# Patient Record
Sex: Female | Born: 1950
Health system: Southern US, Community
[De-identification: ages and names within clinical notes are randomized; demographics above are authoritative.]

## PROBLEM LIST (undated history)

## (undated) ENCOUNTER — Inpatient Hospital Stay (AMBULATORY_SURGERY_CENTER): Payer: Medicare Other | Admitting: Podiatry

## (undated) DIAGNOSIS — Z8739 Personal history of other diseases of the musculoskeletal system and connective tissue: Secondary | ICD-10-CM

## (undated) DIAGNOSIS — N946 Dysmenorrhea, unspecified: Secondary | ICD-10-CM

## (undated) DIAGNOSIS — J309 Allergic rhinitis, unspecified: Secondary | ICD-10-CM

## (undated) DIAGNOSIS — K047 Periapical abscess without sinus: Secondary | ICD-10-CM

## (undated) DIAGNOSIS — K589 Irritable bowel syndrome without diarrhea: Secondary | ICD-10-CM

## (undated) DIAGNOSIS — M8580 Other specified disorders of bone density and structure, unspecified site: Secondary | ICD-10-CM

## (undated) DIAGNOSIS — K219 Gastro-esophageal reflux disease without esophagitis: Secondary | ICD-10-CM

## (undated) DIAGNOSIS — I2699 Other pulmonary embolism without acute cor pulmonale: Secondary | ICD-10-CM

## (undated) DIAGNOSIS — K3184 Gastroparesis: Secondary | ICD-10-CM

## (undated) DIAGNOSIS — G43019 Migraine without aura, intractable, without status migrainosus: Principal | ICD-10-CM

## (undated) DIAGNOSIS — F418 Other specified anxiety disorders: Secondary | ICD-10-CM

## (undated) DIAGNOSIS — K900 Celiac disease: Secondary | ICD-10-CM

## (undated) HISTORY — DX: Gastro-esophageal reflux disease without esophagitis: K21.9

## (undated) HISTORY — DX: Migraine without aura, intractable, without status migrainosus: G43.019

## (undated) HISTORY — PX: TONSILLECTOMY: SUR1361

## (undated) HISTORY — DX: Irritable bowel syndrome, unspecified: K58.9

## (undated) HISTORY — DX: Dysmenorrhea, unspecified: N94.6

## (undated) HISTORY — PX: COLONOSCOPY: SHX174

## (undated) HISTORY — PX: FOOT SURGERY: SHX648

## (undated) HISTORY — PX: ABDOMINAL SURGERY: SHX537

## (undated) HISTORY — PX: TOTAL HIP ARTHROPLASTY: SHX124

## (undated) HISTORY — DX: Other specified anxiety disorders: F41.8

## (undated) HISTORY — DX: Allergic rhinitis, unspecified: J30.9

## (undated) HISTORY — PX: NISSEN FUNDOPLICATION: SHX2091

## (undated) HISTORY — DX: Personal history of other diseases of the musculoskeletal system and connective tissue: Z87.39

## (undated) HISTORY — DX: Other specified disorders of bone density and structure, unspecified site: M85.80

## (undated) HISTORY — PX: BREAST BIOPSY: SHX20

---

## 1997-04-10 HISTORY — PX: BREAST CYST ASPIRATION: SHX578

## 1998-03-11 ENCOUNTER — Ambulatory Visit (HOSPITAL_COMMUNITY): Admission: RE | Admit: 1998-03-11 | Discharge: 1998-03-11 | Payer: Self-pay | Admitting: Obstetrics and Gynecology

## 1998-03-11 ENCOUNTER — Encounter: Payer: Self-pay | Admitting: Obstetrics and Gynecology

## 1998-03-16 ENCOUNTER — Ambulatory Visit (HOSPITAL_COMMUNITY): Admission: RE | Admit: 1998-03-16 | Discharge: 1998-03-16 | Payer: Self-pay | Admitting: Obstetrics and Gynecology

## 1998-03-16 ENCOUNTER — Encounter: Payer: Self-pay | Admitting: Obstetrics and Gynecology

## 1998-06-29 ENCOUNTER — Ambulatory Visit (HOSPITAL_COMMUNITY): Admission: RE | Admit: 1998-06-29 | Discharge: 1998-06-29 | Payer: Self-pay | Admitting: *Deleted

## 1998-06-29 ENCOUNTER — Encounter: Payer: Self-pay | Admitting: *Deleted

## 1999-04-05 ENCOUNTER — Other Ambulatory Visit: Admission: RE | Admit: 1999-04-05 | Discharge: 1999-04-05 | Payer: Self-pay | Admitting: *Deleted

## 1999-05-10 ENCOUNTER — Emergency Department (HOSPITAL_COMMUNITY): Admission: EM | Admit: 1999-05-10 | Discharge: 1999-05-10 | Payer: Self-pay | Admitting: Emergency Medicine

## 1999-07-21 ENCOUNTER — Encounter: Payer: Self-pay | Admitting: Gastroenterology

## 1999-07-21 ENCOUNTER — Encounter: Admission: RE | Admit: 1999-07-21 | Discharge: 1999-07-21 | Payer: Self-pay | Admitting: Gastroenterology

## 1999-08-01 ENCOUNTER — Ambulatory Visit (HOSPITAL_COMMUNITY): Admission: RE | Admit: 1999-08-01 | Discharge: 1999-08-01 | Payer: Self-pay | Admitting: Gastroenterology

## 1999-08-01 ENCOUNTER — Encounter (INDEPENDENT_AMBULATORY_CARE_PROVIDER_SITE_OTHER): Payer: Self-pay

## 1999-08-18 ENCOUNTER — Encounter: Payer: Self-pay | Admitting: Gastroenterology

## 1999-08-18 ENCOUNTER — Inpatient Hospital Stay (HOSPITAL_COMMUNITY): Admission: EM | Admit: 1999-08-18 | Discharge: 1999-08-23 | Payer: Self-pay | Admitting: Gastroenterology

## 1999-08-19 ENCOUNTER — Encounter: Payer: Self-pay | Admitting: Gastroenterology

## 1999-08-20 ENCOUNTER — Encounter: Payer: Self-pay | Admitting: Gastroenterology

## 1999-10-10 ENCOUNTER — Encounter: Payer: Self-pay | Admitting: Gastroenterology

## 1999-10-10 ENCOUNTER — Ambulatory Visit (HOSPITAL_COMMUNITY): Admission: RE | Admit: 1999-10-10 | Discharge: 1999-10-10 | Payer: Self-pay | Admitting: Gastroenterology

## 1999-10-31 ENCOUNTER — Encounter: Payer: Self-pay | Admitting: Gastroenterology

## 1999-10-31 ENCOUNTER — Ambulatory Visit (HOSPITAL_COMMUNITY): Admission: RE | Admit: 1999-10-31 | Discharge: 1999-10-31 | Payer: Self-pay | Admitting: Gastroenterology

## 1999-11-28 ENCOUNTER — Encounter: Payer: Self-pay | Admitting: Gastroenterology

## 1999-11-28 ENCOUNTER — Ambulatory Visit (HOSPITAL_COMMUNITY): Admission: RE | Admit: 1999-11-28 | Discharge: 1999-11-28 | Payer: Self-pay | Admitting: Gastroenterology

## 1999-12-19 ENCOUNTER — Other Ambulatory Visit: Admission: RE | Admit: 1999-12-19 | Discharge: 1999-12-19 | Payer: Self-pay | Admitting: Family Medicine

## 2000-02-16 ENCOUNTER — Ambulatory Visit (HOSPITAL_COMMUNITY): Admission: RE | Admit: 2000-02-16 | Discharge: 2000-02-16 | Payer: Self-pay | Admitting: Internal Medicine

## 2000-02-16 ENCOUNTER — Encounter: Payer: Self-pay | Admitting: Internal Medicine

## 2000-04-17 ENCOUNTER — Other Ambulatory Visit: Admission: RE | Admit: 2000-04-17 | Discharge: 2000-04-17 | Payer: Self-pay | Admitting: Obstetrics and Gynecology

## 2000-06-22 ENCOUNTER — Encounter: Payer: Self-pay | Admitting: Internal Medicine

## 2000-06-22 ENCOUNTER — Encounter: Admission: RE | Admit: 2000-06-22 | Discharge: 2000-06-22 | Payer: Self-pay | Admitting: Internal Medicine

## 2000-06-28 ENCOUNTER — Encounter: Payer: Self-pay | Admitting: Internal Medicine

## 2000-06-28 ENCOUNTER — Ambulatory Visit (HOSPITAL_COMMUNITY): Admission: RE | Admit: 2000-06-28 | Discharge: 2000-06-28 | Payer: Self-pay | Admitting: Internal Medicine

## 2001-02-20 ENCOUNTER — Encounter: Payer: Self-pay | Admitting: Internal Medicine

## 2001-02-20 ENCOUNTER — Ambulatory Visit (HOSPITAL_COMMUNITY): Admission: RE | Admit: 2001-02-20 | Discharge: 2001-02-20 | Payer: Self-pay | Admitting: Internal Medicine

## 2001-04-22 ENCOUNTER — Other Ambulatory Visit: Admission: RE | Admit: 2001-04-22 | Discharge: 2001-04-22 | Payer: Self-pay | Admitting: Obstetrics and Gynecology

## 2002-05-06 ENCOUNTER — Other Ambulatory Visit: Admission: RE | Admit: 2002-05-06 | Discharge: 2002-05-06 | Payer: Self-pay | Admitting: Obstetrics and Gynecology

## 2002-10-20 ENCOUNTER — Encounter: Payer: Self-pay | Admitting: Internal Medicine

## 2002-10-20 ENCOUNTER — Encounter: Admission: RE | Admit: 2002-10-20 | Discharge: 2002-10-20 | Payer: Self-pay | Admitting: Internal Medicine

## 2003-05-11 ENCOUNTER — Other Ambulatory Visit: Admission: RE | Admit: 2003-05-11 | Discharge: 2003-05-11 | Payer: Self-pay | Admitting: Obstetrics and Gynecology

## 2004-06-02 ENCOUNTER — Other Ambulatory Visit: Admission: RE | Admit: 2004-06-02 | Discharge: 2004-06-02 | Payer: Self-pay | Admitting: Internal Medicine

## 2004-06-24 ENCOUNTER — Encounter: Admission: RE | Admit: 2004-06-24 | Discharge: 2004-06-24 | Payer: Self-pay | Admitting: Internal Medicine

## 2004-07-28 ENCOUNTER — Encounter (INDEPENDENT_AMBULATORY_CARE_PROVIDER_SITE_OTHER): Payer: Self-pay | Admitting: *Deleted

## 2004-07-28 ENCOUNTER — Ambulatory Visit (HOSPITAL_COMMUNITY): Admission: RE | Admit: 2004-07-28 | Discharge: 2004-07-28 | Payer: Self-pay | Admitting: *Deleted

## 2004-08-13 ENCOUNTER — Emergency Department (HOSPITAL_COMMUNITY): Admission: EM | Admit: 2004-08-13 | Discharge: 2004-08-13 | Payer: Self-pay | Admitting: Emergency Medicine

## 2005-07-04 ENCOUNTER — Other Ambulatory Visit: Admission: RE | Admit: 2005-07-04 | Discharge: 2005-07-04 | Payer: Self-pay | Admitting: Cardiology

## 2005-07-18 ENCOUNTER — Encounter: Admission: RE | Admit: 2005-07-18 | Discharge: 2005-07-18 | Payer: Self-pay | Admitting: Internal Medicine

## 2005-09-06 ENCOUNTER — Encounter: Admission: RE | Admit: 2005-09-06 | Discharge: 2005-09-06 | Payer: Self-pay | Admitting: Internal Medicine

## 2006-03-22 ENCOUNTER — Encounter (INDEPENDENT_AMBULATORY_CARE_PROVIDER_SITE_OTHER): Payer: Self-pay | Admitting: *Deleted

## 2006-03-22 ENCOUNTER — Encounter: Admission: RE | Admit: 2006-03-22 | Discharge: 2006-03-22 | Payer: Self-pay | Admitting: Internal Medicine

## 2006-11-09 ENCOUNTER — Encounter: Admission: RE | Admit: 2006-11-09 | Discharge: 2006-11-09 | Payer: Self-pay | Admitting: Internal Medicine

## 2007-03-04 ENCOUNTER — Encounter: Admission: RE | Admit: 2007-03-04 | Discharge: 2007-03-04 | Payer: Self-pay | Admitting: Internal Medicine

## 2007-07-12 ENCOUNTER — Encounter: Admission: RE | Admit: 2007-07-12 | Discharge: 2007-07-12 | Payer: Self-pay | Admitting: Internal Medicine

## 2007-11-18 ENCOUNTER — Emergency Department (HOSPITAL_COMMUNITY): Admission: EM | Admit: 2007-11-18 | Discharge: 2007-11-18 | Payer: Self-pay | Admitting: Emergency Medicine

## 2008-03-17 ENCOUNTER — Emergency Department (HOSPITAL_COMMUNITY): Admission: EM | Admit: 2008-03-17 | Discharge: 2008-03-17 | Payer: Self-pay | Admitting: Emergency Medicine

## 2008-03-25 ENCOUNTER — Encounter: Admission: RE | Admit: 2008-03-25 | Discharge: 2008-03-25 | Payer: Self-pay | Admitting: Internal Medicine

## 2008-04-06 ENCOUNTER — Encounter: Admission: RE | Admit: 2008-04-06 | Discharge: 2008-04-06 | Payer: Self-pay | Admitting: Internal Medicine

## 2009-02-25 ENCOUNTER — Emergency Department (HOSPITAL_COMMUNITY): Admission: EM | Admit: 2009-02-25 | Discharge: 2009-02-26 | Payer: Self-pay | Admitting: Emergency Medicine

## 2009-03-29 ENCOUNTER — Encounter: Admission: RE | Admit: 2009-03-29 | Discharge: 2009-03-29 | Payer: Self-pay | Admitting: Internal Medicine

## 2009-10-25 ENCOUNTER — Encounter: Admission: RE | Admit: 2009-10-25 | Discharge: 2009-10-25 | Payer: Self-pay | Admitting: Internal Medicine

## 2010-03-30 ENCOUNTER — Encounter
Admission: RE | Admit: 2010-03-30 | Discharge: 2010-03-30 | Payer: Self-pay | Source: Home / Self Care | Attending: Internal Medicine | Admitting: Internal Medicine

## 2010-04-27 ENCOUNTER — Emergency Department (HOSPITAL_COMMUNITY)
Admission: EM | Admit: 2010-04-27 | Discharge: 2010-04-27 | Payer: Self-pay | Source: Home / Self Care | Admitting: Emergency Medicine

## 2010-05-01 ENCOUNTER — Encounter: Payer: Self-pay | Admitting: Internal Medicine

## 2010-05-02 LAB — POCT I-STAT, CHEM 8
Chloride: 108 mEq/L (ref 96–112)
Creatinine, Ser: 1 mg/dL (ref 0.4–1.2)
Glucose, Bld: 106 mg/dL — ABNORMAL HIGH (ref 70–99)
Hemoglobin: 13.6 g/dL (ref 12.0–15.0)
Potassium: 4 mEq/L (ref 3.5–5.1)
Sodium: 141 mEq/L (ref 135–145)

## 2010-05-03 ENCOUNTER — Emergency Department (HOSPITAL_COMMUNITY)
Admission: EM | Admit: 2010-05-03 | Discharge: 2010-05-03 | Payer: Self-pay | Source: Home / Self Care | Admitting: Emergency Medicine

## 2010-08-26 NOTE — Discharge Summary (Signed)
[facility]  Patient:    Katelyn Mcclain, Katelyn Mcclain                     MRN: [id number] Adm. Date:  [id number] Disc. Date: 08/23/99 Attending:  Sherrin Daisy CC:         Theodoro Parma. Francina Ames., M.D.             Magnus Sinning, M.D., Dept. of Surgery, [facility], [place].                           Discharge Summary  REASON FOR ADMISSION:  Nausea, vomiting and dizziness.  FINAL DIAGNOSES: 1. Severe gastroparesis felt to be secondary to vagus nerve injury. 2. Lymphocytic colitis biopsy. 3. Esophageal reflux status post laparoscopic Nissen fundoplication by    Dr. Osborne Oman. 4. History of depression. 5. History of asthma. 6. History of chronic migraines.  PERTINENT HISTORY:  A very nice 60 year old woman who we saw for the first time in early April came in complaining of diarrhea and a 25 to 50 pound weight loss over 6 months. This began follow a laparoscopic Nissen fundoplication by Dr. Osborne Oman at [facility] last summer. She did very well for approximately 2 months and then began to develop nausea, early satiety and vomiting would come and go. In addition to this, she developed diarrhea. These symptoms would improve and then worsen, improve and then worsen and gradually over time become progressively worse. We saw her in the office and at that time her diarrhea was more a symptom and her workup was fairly extensive. This included a small bowel series which was normal, antiendomysial antibody which was negative for celiac disease, a colonoscopy showing chronic colitis that was pathologically felt to be lymphocytic colitis. The terminal ileum was entered and was normal. The patient was started on Asacol but her vomiting has progressed to the point that she was unable to keep down any of her medicines. I saw her in the office on May 10 and at that time she was vomiting, had lost 12 pounds in 1 month. The patient felt weak and dizzy with standing.  PHYSICAL  EXAMINATION:  VITAL SIGNS:  The patient was afebrile, pulse of 72 lying down, blood pressure 120/70 rising to 78, and blood pressure dropping to 110/70 standing.  GENERAL:  The patient felt dizzy. She is a pale, white female in no acute distress.  HEART/LUNG:  Normal.  ABDOMEN:  Soft and nontender.  RECTAL:  Stool is brown and heme negative.  For more details please see admission history and physical.  HOSPITAL COURSE:  The patient was admitted to the medical floor and given IV fluids. We held all of her outpatient medications and started her on clear liquids. Workup was obtained revealing normal TSH, normal free T4, white count normal at 6.0, hemoglobin 13.6, sodium 139, potassium 3.8, chloride 107, CO2 23, BUN 11, creatinine 1.0, calcium 9.2, albumin 4.0. AST, ALT normal. Amylase and lipase normal. Magnesium level normal. Urinalysis showed 11 to 20 white blood cells. The patient had a culture which is still pending and started empirically on Cipro. A&H for serum gastrin level were normal. Stool workup revealed a negative C. diff tox and was negative for white cells, culture still pending on the stool. The patient had a CT scan of the abdomen that showed tablets within the stomach and the cecum otherwise was unremarkable. She had a gastric emptying scan which  showed severe gastroparesis with 100% remaining in the stomach even after 2 hours. The patient was started on a liquid diet and was given IV Reglan and right before discharge erythromycin and Zofran with some improvement. A discussion was undertaken by Dr. Magnus Sinning at [facility] who will see the patient in the office tomorrow afternoon with thoughts toward a possible emptying procedure. This was discussed with the patient and she was eager to proceed with this.  DISPOSITION:  The patient is discharged home. She will hold all of her outpatient medications other than the inhalers which she will use p.r.n. She will take  erythromycin 1 teaspoon 3 times daily before meals, Reglan 20 mg daily 3 times before meals, Zofran 4 mg q 6h and Cipro 500 mg b.i.d. for 5 days. She will hold the Asacol and hold all of her other medicines other than inhalers which she will use on a p.r.n. basis. She is slightly improved and is tolerating small amounts of clear liquids. She will follow-up tomorrow afternoon in the clinic of Dr. Magnus Sinning in [place]. DD:  08/23/99 TD:  08/23/99 Job: [id number]

## 2010-08-26 NOTE — Op Note (Signed)
Katelyn Mcclain, Katelyn Mcclain              ACCOUNT NO.:  [id number]   MEDICAL RECORD NO.:  [id number]          PATIENT TYPE:  AMB   LOCATION:                               FACILITY:  [facility]   PHYSICIAN:  Waverly Ferrari, M.D.    DATE OF BIRTH:  1950-10-30   DATE OF PROCEDURE:  07/28/2004  DATE OF DISCHARGE:                                 OPERATIVE REPORT   PROCEDURES:  Upper endoscopy.   INDICATIONS:  Hemoccult positivity.   ANESTHESIA:  Demerol 100 mg and Versed 10 mg.   DESCRIPTION OF PROCEDURE:  With the patient mildly sedated in the left  lateral decubitus position, the Olympus videoscopic endoscope was inserted  in the mouth and passed under direct vision through the esophagus, which  appeared normal. The distal esophagus was notable for a surgical wrap, such  as a Nissen fundoplication. We entered into the stomach. The fundus, body,  antrum, duodenal bulb and second portion of the duodenum were visualized and  appeared normal. From this point the endoscope was slowly withdrawn, taking  circumferential views of the duodenal mucosa until the endoscope had been  pulled back into the stomach and placed in retroflexion to view the stomach  from below. Once again the surgical wrap could be seen. The endoscope was  then straightened and withdrawn, taking circumferential views of the  remaining gastric and esophageal mucosa. The patient's vital signs and pulse  oximeter remained stable. The patient tolerated procedure well without  apparent complication.   FINDINGS:  Previous surgical fundoplication, otherwise unremarkable  examination. Of note, we did do small bowel biopsies during this procedure.           ______________________________  Waverly Ferrari, M.D.     GMO/MEDQ  D:  09/17/2004  T:  09/17/2004  Job:  [id number]

## 2010-08-26 NOTE — Op Note (Signed)
[name], Katelyn Mcclain              ACCOUNT NO.:  [id number]   MEDICAL RECORD NO.:  [id number]          PATIENT TYPE:  AMB   LOCATION:  ENDO                         FACILITY:  [facility]   PHYSICIAN:  Waverly Ferrari, M.D.    DATE OF BIRTH:  [birth year]   DATE OF PROCEDURE:  07/28/2004  DATE OF DISCHARGE:                                 OPERATIVE REPORT   PROCEDURE:  Upper endoscopy.   INDICATIONS FOR PROCEDURE:  Diarrhea, abdominal pain.   ANESTHESIA:  Demerol 100, Versed 10 mg.   PROCEDURE:  With the patient mildly sedated in the left lateral decubitus  position, the Olympus videoscopic endoscope was inserted in the mouth and  passed under direct vision through the esophagus which appeared normal.  We  entered into the stomach.  The fundus, body, antrum, duodenal bulb, and  second portion of the duodenum were visualized and we went distally as far  as I could advance the scope.  Biopsies were taken and the scope was  withdrawn taking circumferential views of the small bowel and duodenal  mucosa.  The endoscope was then pulled back in the stomach and placed in  retroflexion to view the stomach from below.  The endoscope was then  straightened and withdrawn taking circumferential views of the remaining  gastric and esophageal mucosa.  The patient's vital signs and pulse oximeter  remained stable.  The patient tolerated the procedure well without apparent  complications.   FINDINGS:  Previous fundoplication surgery, otherwise, unremarkable  examination, await biopsy report, the patient will call me for results and  follow up with me as an outpatient.  Proceed with colonoscopy as planned.      GMO/MEDQ  D:  07/28/2004  T:  07/28/2004  Job:  [id number]

## 2010-08-26 NOTE — Procedures (Signed)
[facility]  Patient:    Katelyn Mcclain, Katelyn Mcclain                     MRN: [id number] Proc. Date: 08/22/99 Adm. Date:  [id number] Attending:  Sherrin Daisy CC:         Theodoro Parma. Francina Ames., M.D.                           Procedure Report  PROCEDURE:  Esophagogastroduodenoscopy.  PREMEDICATION:  Hurricane spray, fentanyl 50 mcg, Versed 5 mg IV.  INDICATION:  The patient had a laparoscopic Nissen fundoplication done at [facility] this past summer with continuing progressive problems since then, to the point that she is vomiting nearly everything.  She has ended up losing approximately 50 pounds.  She is admitted to the hospital.  Gastric emptying scan showed no gastric emptying in two hours.  This is done to rule out a mechanical obstruction.  INFORMED CONSENT:  The procedure has been explained to the patient and consent obtained.  DESCRIPTION OF PROCEDURE:  The patient in the left lateral decubitus position, Olympus adult video endoscope inserted blindly in the esophagus and advanced under direct visualization.  The stomach was entered.  There was a large amount of liquid material in the stomach.  The gastric outlet was identified and passed easily way down the second portion of the duodenum.  No evidence of any blockages were seen this far down.  The scope was withdrawn.  The duodenal bulb was normal as was the pyloric channel.  The stomach was carefully examined and again there was a large amount of liquid in stomach but no solid food material.  The wrap was examined in the retroflex view and did appear somewhat distorted. It was questioned if the fundoplication may have become unwrapped.  The GE junction was patent.  There was no gross esophagitis.  The distal and proximal esophagus were seen well and were normal.  The scope was withdrawn.  The patient tolerated the procedure well.  She was maintained on low flow oxygen and pulse  oximeter throughout the procedure with no obvious problems.  ASSESSMENT: 1. Gastroparesis still with a large amount of retention of gastric juice    despite overnight fast. 2. No evidence of outlet obstruction. 3. Question if the fundoplication may have become unwrapped.  PLAN:  We will start her on full liquids, Reglan, erythromycin, and discuss further problems with her. DD:  08/22/99 TD:  08/22/99 Job: [id number]

## 2010-08-26 NOTE — H&P (Signed)
[facility]  Patient:    Katelyn Mcclain, Katelyn Mcclain                     MRN: [id number] Adm. Date:  [id number] Attending:  Sherrin Daisy CC:         Theodoro Parma. Francina Ames., M.D.                         History and Physical  REASON FOR ADMISSION:  Nausea, vomiting, dehydration and weight loss.  HISTORY OF PRESENT ILLNESS:  A nice 60 year old woman I saw for the first time in early April. At that time, she came in with complaints of diarrhea with a 25 pound weight loss over the previous 6 months following a laparoscopic Nissen fundoplication by Dr. Magnus Sinning at [facility]. This was done in January. She said that she did extraordinarily well for 2 months and then she started having diarrhea and vomiting. She has been seen by Dr. Osborne Oman for this several times and had an upper GI and gallbladder ultrasound done last summer as well as an endoscopy and was told that her wrap was okay. Information that we did obtain from [facility] indicates that labs were non-remarkable. She had a normal endoscopy other than what was questioned to be a somewhat loose wrap at the time of endoscopy. In the information that was sent to me, I could not find an ultrasound note although the patient reports that she had this done. We did find a manometry showing a low LES pressure but normal peristalsis that was done prior to her antireflux operation. At the time that I saw her, in addition to nausea and vomiting, she was having severe diarrhea. She had had labs done earlier that were non-remarkable, but we did do a small bowel follow-through with upper GI. The fundus of the stomach showed an unusual appearance for fundoplication with a hiatal hernia repair. There was a question of a partial breakdown. This was confirming somewhat the endoscopy done at [facility]. Small bowel series was normal with no evidence of obstruction. Other tests that had been done had been amylase, lipase,  and liver tests were all normal as well as a normal sed rate and TSH. Anti-endomysial antibody was negative for celiac disease. This subsequently lead to a colonoscopy with biopsies of the colon showing chronic colitis although the mucosa was endoscopically normal. This was felt to be most consistent with lymphocytic colitis. The terminal ileum was entered and was normal. As mentioned, the patient had a previous small bowel series that was normal. She has continued to do very poorly. We started her on Asacol which has helped her diarrhea some, it slowed it down just a bit. Her main complaint continues to be nausea and vomiting.  Since I last saw her exactly one month ago, she has had almost no good meals, continues to throw up nearly everything, continues to suck on ice chips. She has lost a total of 12 pounds in 1 month and her main complaint at this point in time continues to be the nausea and vomiting.  CURRENT MEDICATIONS:  Mircette 28, one daily, Wellbutrin 100 mg one b.i.d., Celexa 40 mg q.d., BuSpar 15/30 q.d., Imitrex p.r.n., Prilosec 20 mg q.d., Lipitor 20 q.d., hydroxyzine p.r.n., Beconase  A.D. q.d., Serevent disc 50 mcg q.d., Vanceril 42 mg q.d. and albuterol 90 mg 2 t.i.d., Asacol 2 tablets t.i.d.  ALLERGIES:  She has no drug  allergies.  PAST MEDICAL HISTORY: 1. Asthma with intermittent bouts of bronchitis and pneumonia. 2. History of depression. 3. History of chronic migraine headaches. 4. Previous surgery include only the Nissen done by Dr. Osborne Oman at [facility]. She has also had surgery on her toe, a tonsillectomy and a previous    D&C.  FAMILY HISTORY:  Her parents are in their 2s and healthy. She has a sibling who died of some sort of septal defect at age 14. No family history of inflammatory bowel disease.  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  REVIEW OF SYSTEMS:  Remarkable for severe nausea and vomiting, inability to swallow or keep foods down and loose stools. She has had problems with previous alternating diarrhea and constipation.  PHYSICAL EXAMINATION:  VITAL SIGNS:  Temperature 99.2, pulse laying 72, blood pressure 120/70 rising to 78, blood pressure 110/70 standing. The patient felt dizzy.  GENERAL:  A pale looking white female. Weight in our office documents a 12 pound weight loss. Eyes, sclerae nonicteric, extraocular movements intact.  NECK:  Supple, no lymphadenopathy.  LUNGS:  Clear.  HEART:  Regular rate and rhythm without murmurs or gallops.  ABDOMEN:  Soft, completely nontender, nondistended. I do not appreciate a succussion splash.  RECTAL:  Stool is brown, heme negative.  ASSESSMENT: 1. Nausea, vomiting, and weight loss. This woman has lost at least 12 pounds    in a month and prior to this has lost an additional 25 pounds for a total    of at least 37 pounds since last summer. The nausea and vomiting appears    her main concern. After a Nissen fundoplication, it should be difficult    for her to vomit. Given the concerns on both the endoscopy and the upper GI    I would wonder if the wrap has come to pieces and may need to be repaired.    I do not think that the lymphocytic colitis is causing problems of nausea    and vomiting although it could be causing some sort of small bowel disease    I suppose. Biliary tract disease would be another possibility. 2. Lymphocytic colitis by colonic biopsy could well be causing her diarrhea.    Seems to have been somewhat improved with Asacol. I do not this again is    causing her nausea and vomiting. 3. Asthma with intermittent bronchitis. 4. History of depression. 5. History of chronic migraines. 6. Esophageal reflux status post antireflux procedure a year ago.  PLAN:  Will admitted to the hospital for IV hydration. Will obtain  routine labs and look for any other obvious abnormalities. Will obtain an ultrasound  of the abdomen and look for possible gallstones and will make further recommendations depending on the results of that. Will probably need to do a CT as well to look to see if the wrap has come down or something. DD:  08/18/99 TD:  08/18/99 Job: [id number]

## 2010-08-26 NOTE — Op Note (Signed)
NAMEDEVANEY, SEGERS              ACCOUNT NO.:  [id number]   MEDICAL RECORD NO.:  [id number]          PATIENT TYPE:  AMB   LOCATION:  ENDO                         FACILITY:  [facility]   PHYSICIAN:  Waverly Ferrari, M.D.    DATE OF BIRTH:  25-Jul-1950   DATE OF PROCEDURE:  DATE OF DISCHARGE:                                 OPERATIVE REPORT   PROCEDURE:  Colonoscopy.   INDICATIONS:  Diarrhea, Hemoccult positivity.   ANESTHESIA:  Demerol 10 mg, Versed 1 mg.   DESCRIPTION OF PROCEDURE:  With the patient mildly sedated in the left  lateral decubitus position, the Olympus videoscopic colonoscope was inserted  into the rectum and passed under direct vision to the cecum identified by  the ileocecal valve and appendiceal orifice, the latter of which was  photographed.  From this point the colonoscope was slowly withdrawn taking  circumferential views of the colonic mucosa, stopping only to take random  biopsies along the way until we reached the rectum which appeared normal on  retroflex view.  The endoscope was straightened and withdrawn.  The  patient's vital signs and pulse oximetry remained stable.  The patient  tolerated the procedure well without apparent complications.   FINDINGS:  Internal hemorrhoids, otherwise unremarkable examination.   Await biopsy report, the patient will call me for results and follow up with  me as an outpatient.      GMO/MEDQ  D:  07/28/2004  T:  07/28/2004  Job:  [id number]

## 2011-01-06 LAB — URINALYSIS, ROUTINE W REFLEX MICROSCOPIC
Hgb urine dipstick: NEGATIVE
Nitrite: NEGATIVE
Specific Gravity, Urine: 1.009
Urobilinogen, UA: 0.2
pH: 6

## 2011-01-06 LAB — POCT I-STAT, CHEM 8
Chloride: 104
HCT: 39
Hemoglobin: 13.3
Potassium: 3.8
Sodium: 140

## 2011-01-12 LAB — COMPREHENSIVE METABOLIC PANEL
ALT: 17 U/L (ref 0–35)
AST: 19 U/L (ref 0–37)
Alkaline Phosphatase: 66 U/L (ref 39–117)
CO2: 26 mEq/L (ref 19–32)
Chloride: 107 mEq/L (ref 96–112)
GFR calc Af Amer: >60 mL/min (ref >60)
GFR calc non Af Amer: 58 mL/min — ABNORMAL LOW (ref >60)
Glucose, Bld: 88 mg/dL (ref 70–99)
Sodium: 141 mEq/L (ref 135–145)
Total Bilirubin: 0.7 mg/dL (ref 0.3–1.2)

## 2011-01-12 LAB — URINALYSIS, ROUTINE W REFLEX MICROSCOPIC
Bilirubin Urine: NEGATIVE
Ketones, ur: 15 mg/dL — AB
Protein, ur: NEGATIVE mg/dL
Urobilinogen, UA: 0.2 mg/dL (ref 0.0–1.0)

## 2011-01-12 LAB — CBC
Hemoglobin: 14.2 g/dL (ref 12.0–15.0)
RBC: 4.58 MIL/uL (ref 3.87–5.11)
WBC: 8.3 10*3/uL (ref 4.0–10.5)

## 2011-01-12 LAB — URINE MICROSCOPIC-ADD ON

## 2011-03-12 ENCOUNTER — Telehealth: Payer: Self-pay | Admitting: Internal Medicine

## 2011-03-12 ENCOUNTER — Encounter: Payer: Self-pay | Admitting: *Deleted

## 2011-03-12 ENCOUNTER — Emergency Department (HOSPITAL_COMMUNITY)
Admission: EM | Admit: 2011-03-12 | Discharge: 2011-03-13 | Disposition: A | Discharge status: Home / Self Care | Payer: BC Managed Care – PPO | Attending: Emergency Medicine | Admitting: Emergency Medicine

## 2011-03-12 DIAGNOSIS — K900 Celiac disease: Secondary | ICD-10-CM | POA: Insufficient documentation

## 2011-03-12 DIAGNOSIS — R109 Unspecified abdominal pain: Secondary | ICD-10-CM | POA: Insufficient documentation

## 2011-03-12 DIAGNOSIS — Z9889 Other specified postprocedural states: Secondary | ICD-10-CM | POA: Insufficient documentation

## 2011-03-12 DIAGNOSIS — R10819 Abdominal tenderness, unspecified site: Secondary | ICD-10-CM | POA: Insufficient documentation

## 2011-03-12 DIAGNOSIS — G8918 Other acute postprocedural pain: Secondary | ICD-10-CM

## 2011-03-12 HISTORY — DX: Gastroparesis: K31.84

## 2011-03-12 HISTORY — DX: Celiac disease: K90.0

## 2011-03-12 LAB — URINALYSIS, ROUTINE W REFLEX MICROSCOPIC
Glucose, UA: NEGATIVE mg/dL
Protein, ur: NEGATIVE mg/dL
Specific Gravity, Urine: 1.017 (ref 1.005–1.030)
pH: 6 (ref 5.0–8.0)

## 2011-03-12 LAB — DIFFERENTIAL
Basophils Absolute: 0 10*3/uL (ref 0.0–0.1)
Basophils Relative: 0 % (ref 0–1)
Eosinophils Relative: 2 % (ref 0–5)
Monocytes Absolute: 0.8 10*3/uL (ref 0.1–1.0)
Neutro Abs: 4.7 10*3/uL (ref 1.7–7.7)

## 2011-03-12 LAB — URINE MICROSCOPIC-ADD ON

## 2011-03-12 LAB — CBC
HCT: 38 % (ref 36.0–46.0)
MCHC: 34.7 g/dL (ref 30.0–36.0)
RDW: 13.1 % (ref 11.5–15.5)

## 2011-03-12 MED ORDER — ONDANSETRON HCL 4 MG/2ML IJ SOLN
4.0000 mg | Freq: Once | INTRAMUSCULAR | Status: AC
  Administered 2011-03-12: 4 mg via INTRAVENOUS
  Filled 2011-03-12: qty 2

## 2011-03-12 MED ORDER — SODIUM CHLORIDE 0.9 % IV BOLUS (SEPSIS)
1000.0000 mL | Freq: Once | INTRAVENOUS | Status: AC
  Administered 2011-03-12: 1000 mL via INTRAVENOUS

## 2011-03-12 MED ORDER — FENTANYL CITRATE 0.05 MG/ML IJ SOLN
50.0000 ug | Freq: Once | INTRAMUSCULAR | Status: AC
  Administered 2011-03-12: 23:00:00 via INTRAVENOUS
  Filled 2011-03-12: qty 2

## 2011-03-12 NOTE — ED Provider Notes (Signed)
History     CSN: [id number] Arrival date & time: 03/12/2011  7:17 PM   First MD Initiated Contact with Patient 03/12/11 2151      Chief Complaint  Patient presents with  . Abdominal Pain    Colonoscopy x 2 days ago, nausea, pt passing flatus, no BM since colonoscopy.    (Consider location/radiation/quality/duration/timing/severity/associated sxs/prior treatment) HPI Pt had a colonoscopy 2 days ago.  She had polyps removed during the procedure.  Since then she has been having pain.  The pain is all over her abdomen. It gets worse when she stands.  No vomiting or diarrhea.  Some nausea.  Passing flatus.  Pt called her GI doctor and was told to come in.  The pain was 9/10 last night.   Past Medical History  Diagnosis Date  . Celiac disease   . Gastroparesis   . Asthma   . Migraine     Past Surgical History  Procedure Date  . Colonoscopy   . Abdominal surgery     History reviewed. No pertinent family history.  History  Substance Use Topics  . Smoking status: Former Research scientist (life sciences)  . Smokeless tobacco: Not on file  . Alcohol Use: No    OB History    Grav Para Term Preterm Abortions TAB SAB Ect Mult Living                  Review of Systems  Gastrointestinal: Positive for abdominal pain. Negative for vomiting, diarrhea and abdominal distention.  Genitourinary: Negative for dysuria.  All other systems reviewed and are negative.    Allergies  Morphine and related  Home Medications   Current Outpatient Rx  Name Route Sig Dispense Refill  . ACETAMINOPHEN 500 MG PO TABS Oral Take 500 mg by mouth every 6 (six) hours as needed. PAIN     . RISAQUAD PO CAPS Oral Take 1 capsule by mouth daily.      . BUPROPION HCL ER (XL) 150 MG PO TB24 Oral Take 150 mg by mouth daily.      Marland Kitchen CALCIUM CARBONATE 600 MG PO TABS Oral Take 600 mg by mouth daily.      . DEXLANSOPRAZOLE 30 MG PO CPDR Oral Take 30 mg by mouth daily.      Marland Kitchen ESCITALOPRAM OXALATE 20 MG PO TABS Oral Take 30 mg by mouth  daily.      . OMEGA-3 FATTY ACIDS 1000 MG PO CAPS Oral Take 2 g by mouth daily.      Marland Kitchen FLUTICASONE-SALMETEROL 100-50 MCG/DOSE IN AEPB Inhalation Inhale 1 puff into the lungs every 12 (twelve) hours.      Marland Kitchen FOLIC ACID 1 MG PO TABS Oral Take 1 mg by mouth daily.      . OSTEO BI-FLEX ADV JOINT SHIELD PO Oral Take 1 tablet by mouth daily.      Marland Kitchen MONTELUKAST SODIUM 10 MG PO TABS Oral Take 10 mg by mouth at bedtime.        BP 124/74  Pulse 86  Temp(Src) 98.4 F (36.9 C) (Oral)  Resp 20  SpO2 99%  Physical Exam  Nursing note and vitals reviewed. Constitutional: She appears well-developed and well-nourished. No distress.  HENT:  Head: Normocephalic and atraumatic.  Right Ear: External ear normal.  Left Ear: External ear normal.  Eyes: Conjunctivae are normal. Right eye exhibits no discharge. Left eye exhibits no discharge. No scleral icterus.  Neck: Neck supple. No tracheal deviation present.  Cardiovascular: Normal rate, regular rhythm and  intact distal pulses.   Pulmonary/Chest: Effort normal and breath sounds normal. No stridor. No respiratory distress. She has no wheezes. She has no rales.  Abdominal: Soft. Bowel sounds are normal. She exhibits no distension. There is tenderness in the suprapubic area. There is no rebound and no guarding.  Musculoskeletal: She exhibits no edema and no tenderness.  Neurological: She is alert. She has normal strength. No sensory deficit. Cranial nerve deficit:  no gross defecits noted. She exhibits normal muscle tone. She displays no seizure activity. Coordination normal.  Skin: Skin is warm and dry. No rash noted.  Psychiatric: She has a normal mood and affect.    ED Course  Procedures (including critical care time)  Medications  Fluticasone-Salmeterol (ADVAIR) 100-50 MCG/DOSE AEPB (not administered)  Dexlansoprazole 30 MG capsule (not administered)  escitalopram (LEXAPRO) 20 MG tablet (not administered)  buPROPion (WELLBUTRIN XL) 150 MG 24 hr  tablet (not administered)  fish oil-omega-3 fatty acids 1000 MG capsule (not administered)  acidophilus (RISAQUAD) CAPS (not administered)  Misc Natural Products (OSTEO BI-FLEX ADV JOINT SHIELD PO) (not administered)  calcium carbonate (OS-CAL) 600 MG TABS (not administered)  folic acid (FOLVITE) 1 MG tablet (not administered)  montelukast (SINGULAIR) 10 MG tablet (not administered)  acetaminophen (TYLENOL) 500 MG tablet (not administered)  ondansetron (ZOFRAN) injection 4 mg (4 mg Intravenous Given 03/12/11 2253)  sodium chloride 0.9 % bolus 1,000 mL (1000 mL Intravenous Given 03/12/11 2300)  fentaNYL (SUBLIMAZE) injection 50 mcg ( mcg Intravenous Given 03/12/11 2255)    Labs Reviewed  URINALYSIS, ROUTINE W REFLEX MICROSCOPIC - Abnormal; Notable for the following:    Hgb urine dipstick MODERATE (*)    Ketones, ur TRACE (*)    Leukocytes, UA TRACE (*)    All other components within normal limits  URINE MICROSCOPIC-ADD ON - Abnormal; Notable for the following:    Bacteria, UA FEW (*)    All other components within normal limits  CBC  DIFFERENTIAL  COMPREHENSIVE METABOLIC PANEL   No results found.    MDM  Patient with recent colonoscopy. She states she's had this procedure in the past and has never had this pain. I discussed the case with Dr. Fuller Plan who was on call for gastroenterology. We will get an abdominal pelvic CT to rule out perforation or other acute complication from the colonoscopy.        Kathalene Frames, MD 03/13/11 [phone]

## 2011-03-12 NOTE — Telephone Encounter (Signed)
PATIENT CALLED A FEW MINUTES AGO REPORTING SIGNIFICANT AND PERSISTENT ABDOMINAL PAIN SINCE HER COLONOSCOPY 2 DAYS AGO. REPORTS POLYPECTOMY. NO N,V, OR BLEEDING. MAYBE LOW GRADE TEMP. INSTRUCTED TO GO TO ER ASAP FOR EVALUATION (R/O PERFORATION OR POST-POLYPECTOMY SYNDROME). SHE AGREES AND WILL GO TO [facility].

## 2011-03-12 NOTE — ED Notes (Signed)
Pt c/o abdominal pain s/p colonoscopy x 2 days ago. Pt denies urinary s/s. Pt denies BM since prior to colonoscopy. Pt c/o nausea, denies vomiting. Pt is passing flatus.

## 2011-03-13 ENCOUNTER — Emergency Department (HOSPITAL_COMMUNITY): Payer: BC Managed Care – PPO

## 2011-03-13 ENCOUNTER — Encounter (HOSPITAL_COMMUNITY): Payer: Self-pay | Admitting: Radiology

## 2011-03-13 LAB — COMPREHENSIVE METABOLIC PANEL
AST: 15 U/L (ref 0–37)
Albumin: 3.3 g/dL — ABNORMAL LOW (ref 3.5–5.2)
Calcium: 9.1 mg/dL (ref 8.4–10.5)
Creatinine, Ser: 0.91 mg/dL (ref 0.50–1.10)
Total Protein: 6.8 g/dL (ref 6.0–8.3)

## 2011-03-13 MED ORDER — OXYCODONE-ACETAMINOPHEN 5-325 MG PO TABS: 2.0000 | ORAL_TABLET | ORAL | Status: AC | PRN

## 2011-03-13 MED ORDER — IOHEXOL 300 MG/ML  SOLN
100.0000 mL | Freq: Once | INTRAMUSCULAR | Status: AC | PRN
  Administered 2011-03-13: 100 mL via INTRAVENOUS

## 2011-03-13 MED ORDER — ONDANSETRON HCL 4 MG/2ML IJ SOLN
4.0000 mg | Freq: Once | INTRAMUSCULAR | Status: AC
  Administered 2011-03-13: 4 mg via INTRAVENOUS
  Filled 2011-03-13: qty 2

## 2011-03-13 NOTE — ED Notes (Signed)
MD at bedside. 

## 2011-03-13 NOTE — ED Notes (Signed)
Patient transported to CT 

## 2011-03-13 NOTE — ED Notes (Signed)
Informed CT that patient was done drinking the contrast media

## 2011-03-13 NOTE — ED Provider Notes (Signed)
  Physical Exam  BP 118/65  Pulse 80  Temp(Src) 98.9 F (37.2 C) (Oral)  Resp 18  SpO2 96%  Physical Exam  ED Course  Procedures  Abdominal pain post colonoscopy. Discussed with GI. CT shows secitis. Discussed with Dr Henrene Pastor. Recommends followup with Dr. Collene Mares.   Jasper Riling. Alvino Chapel, MD 03/13/11 [phone]

## 2011-05-05 ENCOUNTER — Other Ambulatory Visit (HOSPITAL_COMMUNITY): Payer: Self-pay | Admitting: Internal Medicine

## 2011-05-05 DIAGNOSIS — Z1231 Encounter for screening mammogram for malignant neoplasm of breast: Secondary | ICD-10-CM

## 2011-06-02 ENCOUNTER — Ambulatory Visit (HOSPITAL_COMMUNITY)
Admission: RE | Admit: 2011-06-02 | Discharge: 2011-06-02 | Disposition: A | Discharge status: Home / Self Care | Payer: BC Managed Care – PPO | Source: Ambulatory Visit | Attending: Internal Medicine | Admitting: Internal Medicine

## 2011-06-02 DIAGNOSIS — Z1231 Encounter for screening mammogram for malignant neoplasm of breast: Secondary | ICD-10-CM

## 2011-08-03 ENCOUNTER — Other Ambulatory Visit: Payer: Self-pay | Admitting: Orthopedic Surgery

## 2011-08-07 NOTE — H&P (Signed)
  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  She presents at the request of Dr. Jani Mcclain for evaluation and management of a mass on the volar aspect of her left ring finger PIP flexion crease.  This is consistent with a probable myxoid cyst.   She has no antecedent history of injury.   Her past history is reviewed in detail. She reports that she is 5'10" tall and weighs 175 pounds.  Her pain is intermittent. She does not use prescription medication for her discomfort. She notes that she has migraine headaches when exposed to morphine.    Current medications include Dexilant 60 mg. daily, Lexapro 30 mg. daily, Singulair 10 mg. daily, Advair inhalation b.i.d., albuterol PRN and Maxalt PRN migraine headache symptoms.    She lists no prior surgery.    Social history reveals that she is married, she is a nonsmoker, she enjoys a rare alcoholic beverage.  Family history detailed and positive for hypertension and arthritis affecting primary relatives.   14-point review of systems reveals corrective lenses, history of asthma, rash, migraine headaches and easy bleeding.   Physical examination reveals a well appearing 61 year-old woman.  Inspection of her hands reveals a purplish mass on the volar aspect of her left ring finger at the PIP flexion crease. This is quite firm and tender. This is consistent with a myxoid cyst of a vein or the flexor mechanism at the A-3 pulley.    She has full range of motion of her finger. This is quite uncomfortable for her when she grasps a firm object.    ASSESSMENT:    Myxoid cyst, flexor retinaculum vs. venous hemangioma.    PLAN:  We will schedule her for excision of the cyst under local anesthesia as a minor operating room patient at the [facility].  The surgery and aftercare were described in detail. We will schedule this at a mutually convenient time in the near future.   H&P documentation:  08/08/2011  -History and Physical Reviewed  -Patient has been re-examined  -No change in the plan of care  Katelyn Sickle, MD

## 2011-08-07 NOTE — Discharge Instructions (Signed)

## 2011-08-08 ENCOUNTER — Ambulatory Visit (HOSPITAL_BASED_OUTPATIENT_CLINIC_OR_DEPARTMENT_OTHER)
Admission: RE | Admit: 2011-08-08 | Discharge: 2011-08-08 | Disposition: A | Discharge status: Home / Self Care | Payer: BC Managed Care – PPO | Source: Ambulatory Visit | Attending: Orthopedic Surgery | Admitting: Orthopedic Surgery

## 2011-08-08 ENCOUNTER — Encounter (HOSPITAL_BASED_OUTPATIENT_CLINIC_OR_DEPARTMENT_OTHER)
Admission: RE | Disposition: A | Discharge status: Home / Self Care | Payer: Self-pay | Source: Ambulatory Visit | Attending: Orthopedic Surgery

## 2011-08-08 DIAGNOSIS — M799 Soft tissue disorder, unspecified: Secondary | ICD-10-CM | POA: Insufficient documentation

## 2011-08-08 HISTORY — PX: MASS EXCISION: SHX2000

## 2011-08-08 SURGERY — MINOR EXCISION OF MASS
Anesthesia: LOCAL | Site: Finger | Laterality: Left

## 2011-08-08 MED ORDER — LIDOCAINE HCL 2 % IJ SOLN
INTRAMUSCULAR | Status: DC | PRN
  Administered 2011-08-08: 3 mL

## 2011-08-08 MED ORDER — HYDROCODONE-ACETAMINOPHEN 5-325 MG PO TABS: ORAL_TABLET | ORAL | Status: AC

## 2011-08-08 MED ORDER — CHLORHEXIDINE GLUCONATE 4 % EX LIQD: 60.0000 mL | Freq: Once | CUTANEOUS | Status: DC

## 2011-08-08 SURGICAL SUPPLY — 38 items
BANDAGE ADHESIVE 1X3 (GAUZE/BANDAGES/DRESSINGS) IMPLANT
BLADE SURG 15 STRL LF DISP TIS (BLADE) ×1 IMPLANT
BLADE SURG 15 STRL SS (BLADE) ×2
BNDG CMPR 9X4 STRL LF SNTH (GAUZE/BANDAGES/DRESSINGS)
BNDG CMPR MD 5X2 ELC HKLP STRL (GAUZE/BANDAGES/DRESSINGS)
BNDG COHESIVE 1X5 TAN STRL LF (GAUZE/BANDAGES/DRESSINGS) ×1 IMPLANT
BNDG ELASTIC 2 VLCR STRL LF (GAUZE/BANDAGES/DRESSINGS) IMPLANT
BNDG ESMARK 4X9 LF (GAUZE/BANDAGES/DRESSINGS) IMPLANT
BRUSH SCRUB EZ PLAIN DRY (MISCELLANEOUS) ×2 IMPLANT
CLOTH BEACON ORANGE TIMEOUT ST (SAFETY) ×1 IMPLANT
CORDS BIPOLAR (ELECTRODE) IMPLANT
COVER MAYO STAND STRL (DRAPES) ×2 IMPLANT
CUFF TOURNIQUET SINGLE 18IN (TOURNIQUET CUFF) ×1 IMPLANT
DECANTER SPIKE VIAL GLASS SM (MISCELLANEOUS) IMPLANT
DRAIN PENROSE 1/2X12 LTX STRL (WOUND CARE) IMPLANT
DRAPE SURG 17X23 STRL (DRAPES) ×2 IMPLANT
DRAPE UTILITY W/TAPE 26X15 (DRAPES) ×1 IMPLANT
GAUZE SPONGE 4X4 12PLY STRL LF (GAUZE/BANDAGES/DRESSINGS) ×2 IMPLANT
GAUZE XEROFORM 1X8 LF (GAUZE/BANDAGES/DRESSINGS) ×1 IMPLANT
GLOVE BIO SURGEON STRL SZ 6.5 (GLOVE) ×1 IMPLANT
GLOVE BIOGEL M STRL SZ7.5 (GLOVE) ×2 IMPLANT
GLOVE EXAM NITRILE PF MED BLUE (GLOVE) ×1 IMPLANT
GLOVE ORTHO TXT STRL SZ7.5 (GLOVE) ×2 IMPLANT
GOWN PREVENTION PLUS XLARGE (GOWN DISPOSABLE) ×1 IMPLANT
NEEDLE 27GAX1X1/2 (NEEDLE) ×1 IMPLANT
PACK BASIN DAY SURGERY FS (CUSTOM PROCEDURE TRAY) ×2 IMPLANT
PADDING CAST ABS 4INX4YD NS (CAST SUPPLIES)
PADDING CAST ABS COTTON 4X4 ST (CAST SUPPLIES) ×1 IMPLANT
SPONGE GAUZE 4X4 12PLY (GAUZE/BANDAGES/DRESSINGS) ×1 IMPLANT
STOCKINETTE 4X48 STRL (DRAPES) ×2 IMPLANT
SUT ETHILON 5 0 P 3 18 (SUTURE) ×1
SUT NYLON ETHILON 5-0 P-3 1X18 (SUTURE) ×1 IMPLANT
SYR 3ML 23GX1 SAFETY (SYRINGE) IMPLANT
SYR CONTROL 10ML LL (SYRINGE) ×1 IMPLANT
TOWEL OR 17X24 6PK STRL BLUE (TOWEL DISPOSABLE) ×3 IMPLANT
TRAY DSU PREP LF (CUSTOM PROCEDURE TRAY) ×2 IMPLANT
UNDERPAD 30X30 INCONTINENT (UNDERPADS AND DIAPERS) ×2 IMPLANT
WATER STERILE IRR 1000ML POUR (IV SOLUTION) ×2 IMPLANT

## 2011-08-08 NOTE — Brief Op Note (Signed)
08/08/2011  11:06 AM  PATIENT:  Katelyn Mcclain  61 y.o. female  PRE-OPERATIVE DIAGNOSIS:  cyst left ring finger  POST-OPERATIVE DIAGNOSIS:  Thrombosed volar vein vs myxoid cyst  PROCEDURE:   MINOR ROOM EXCISION OF MASS VOLAR ASPECT OF LEFT RING FINGER   SURGEON:  Wyn Forster., MD   PHYSICIAN ASSISTANT:   ASSISTANTS: Mallory Shirk.A-C   ANESTHESIA:   local  EBL:     BLOOD ADMINISTERED:none  DRAINS: none   LOCAL MEDICATIONS USED:  XYLOCAINE   SPECIMEN:  No Specimen  DISPOSITION OF SPECIMEN:  N/A  COUNTS:  YES  TOURNIQUET:   Total Tourniquet Time Documented: Forearm (Left) - 4 minutes  DICTATION: .Other Dictation: Dictation Number [id number]  PLAN OF CARE: Discharge to home after PACU  PATIENT DISPOSITION:  PACU - hemodynamically stable.

## 2011-08-09 ENCOUNTER — Encounter (HOSPITAL_BASED_OUTPATIENT_CLINIC_OR_DEPARTMENT_OTHER): Payer: Self-pay | Admitting: Orthopedic Surgery

## 2011-08-10 NOTE — Op Note (Signed)
[name], [name]              ACCOUNT NO.:  [id number]  MEDICAL RECORD NO.:  [id number]  LOCATION:                                 FACILITY:  PHYSICIAN:  [name]. Wakisha Alberts, M.D. DATE OF BIRTH:  03-03-1951  DATE OF PROCEDURE:  08/08/2011 DATE OF DISCHARGE:                              OPERATIVE REPORT   PREOPERATIVE DIAGNOSIS:  Painful violaceous mass, palmar surface of left ring finger proximal interphalangeal flexion crease.  POSTOPERATIVE DIAGNOSIS:  Probable thrombosed volar vein versus myxoid cyst of vein.  OPERATION:  Resection of painful mass volar aspect of left ring finger proximal interphalangeal flexion crease.  OPERATING SURGEON:  [name]. Betsie Peckman, M.D  ASSISTANT:  Julian Reil, P.A.  ANESTHESIA:  2% lidocaine metacarpal head level block, left ring finger. This performed as a minor operating room procedure.  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  She presented for evaluation and management of a painful mass in her left ring finger that was impairing her ability to work and use her hand for pre hands all function.  Inspection of her hand revealed what appeared to be a violaceous mass consistent with a blood stained myxoid cyst on the volar surface of the flexor sheath versus a thrombosed volar vein.  We recommended that she could observe this as long as she wanted to. This is uncomfortable for her and requested that we excise it.  We recommended simple excision under local anesthesia.  PROCEDURE:  [name] was brought to room 2 of the [facility] and placed in supine position up on the operating table.  Following detailed informed consent and alcohol Betadine prep, 2% lidocaine was infiltrated metacarpal head level.  After approximately 5 minutes of excellent anesthesia of the finger was achieved, the hand and arm were prepped with Betadine soap  and solution, sterilely draped.  Following routine surgical time-out, the hand and arm were exsanguinated by direct compression and arterial tourniquet on the mid forearm was inflated 220 mmHg.  Procedure commenced with an oblique incision directly over the mass. Subcu tissues were carefully divided revealing a 3 mm diameter thrombosed vein versus a myxoid cystic vein.  This was full of organized clot.  This did not appear to involve the flexor sheath directly.  This was resected with a small portion of the feeding vessel.  The wound was then repaired with mattress suture of 5-0 nylon.  A simple dressing was applied with Xeroflo sterile gauze and a Coban finger dressing.  For aftercare, she was provided a prescription for Norco 5 mg 1 p.o. q.4- 6 hours p.r.n. pain.  She will likely use Tylenol or Aleve as her primary analgesic.     [name] [name], M.D.     RVS/MEDQ  D:  08/08/2011  T:  08/08/2011  Job:  [id number]  cc:   Arlyss Repress, MD

## 2011-10-03 ENCOUNTER — Ambulatory Visit (INDEPENDENT_AMBULATORY_CARE_PROVIDER_SITE_OTHER): Payer: BC Managed Care – PPO | Admitting: Family Medicine

## 2011-10-03 ENCOUNTER — Encounter: Payer: Self-pay | Admitting: Family Medicine

## 2011-10-03 ENCOUNTER — Ambulatory Visit: Payer: BC Managed Care – PPO

## 2011-10-03 VITALS — BP 96/60 | HR 71 | Temp 98.1°F | Resp 18 | Ht 70.0 in | Wt 169.6 lb

## 2011-10-03 DIAGNOSIS — M25579 Pain in unspecified ankle and joints of unspecified foot: Secondary | ICD-10-CM

## 2011-10-03 DIAGNOSIS — W19XXXA Unspecified fall, initial encounter: Secondary | ICD-10-CM

## 2011-10-03 NOTE — Progress Notes (Signed)
Patient Name: Katelyn Mcclain Date of Birth: 01/30/1951 Medical Record Number: [id number] Gender: female Date of Encounter: 10/03/2011  History of Present Illness:  Katelyn Mcclain is a 61 y.o. very pleasant female patient who presents with the following:  She fell down a few stairs today.  She was getting ready for work early this mornring.  She missed a couple of stairs, slipped/ fell  and inverted her right ankle.  She had immediate ankle pain, and it is now swollen.  She is able to walk, but she has tenderness.  No other injury today.   There is no problem list on file for this patient.  Past Medical History  Diagnosis Date  . Celiac disease   . Gastroparesis   . Asthma   . Migraine   . Asthma     inhaler prn   Past Surgical History  Procedure Date  . Colonoscopy   . Abdominal surgery   . Mass excision 08/08/2011    Procedure: MINOR EXCISION OF MASS;  Surgeon: Cammie Sickle., MD;  Location: [facility];  Service: Orthopedics;  Laterality: Left;  left ring excision cyst proximal phalangeal joint   History  Substance Use Topics  . Smoking status: Former Research scientist (life sciences)  . Smokeless tobacco: Not on file  . Alcohol Use: No   No family history on file. Allergies  Allergen Reactions  . Morphine And Related Other (See Comments)    Sensitive to narcotics, worsening migraines.  . Adhesive (Tape)     Medication list has been reviewed and updated.  Prior to Admission medications   Medication Sig Start Date End Date Taking? Authorizing Provider  acetaminophen (TYLENOL) 500 MG tablet Take 500 mg by mouth every 6 (six) hours as needed. PAIN    Yes Historical Provider, MD  acidophilus (RISAQUAD) CAPS Take 1 capsule by mouth daily.     Yes Historical Provider, MD  buPROPion (WELLBUTRIN XL) 150 MG 24 hr tablet Take 150 mg by mouth daily.     Yes Historical Provider, MD  calcium carbonate (OS-CAL) 600 MG TABS Take 600 mg by mouth daily.     Yes Historical  Provider, MD  Dexlansoprazole 30 MG capsule Take 30 mg by mouth daily.     Yes Historical Provider, MD  escitalopram (LEXAPRO) 20 MG tablet Take 30 mg by mouth daily.     Yes Historical Provider, MD  fish oil-omega-3 fatty acids 1000 MG capsule Take 2 g by mouth daily.     Yes Historical Provider, MD  Fluticasone-Salmeterol (ADVAIR) 100-50 MCG/DOSE AEPB Inhale 1 puff into the lungs every 12 (twelve) hours.     Yes Historical Provider, MD  folic acid (FOLVITE) 1 MG tablet Take 1 mg by mouth daily.     Yes Historical Provider, MD  Misc Natural Products (OSTEO BI-FLEX ADV JOINT SHIELD PO) Take 1 tablet by mouth daily.     Yes Historical Provider, MD  montelukast (SINGULAIR) 10 MG tablet Take 10 mg by mouth at bedtime.     Yes Historical Provider, MD    Review of Systems:  As per HPI- otherwise negative.   Physical Examination: Filed Vitals:   10/03/11 0917  BP: 96/60  Pulse: 71  Temp: 98.1 F (36.7 C)  Resp: 18   Filed Vitals:   10/03/11 0917  Height: 5' 10"  (1.778 m)  Weight: 169 lb 9.6 oz (76.93 kg)   Body mass index is 24.34 kg/(m^2). Ideal Body Weight: Weight in (lb)  to have BMI = 25: 173.9    GEN: WDWN, NAD, Non-toxic, Alert & Oriented x 3 HEENT: Atraumatic, Normocephalic.  Ears and Nose: No external deformity. EXTR: No clubbing/cyanosis/edema NEURO: Normal gait.  PSYCH: Normally interactive. Conversant. Not depressed or anxious appearing.  Calm demeanor.  Right ankle and foot: tender and swollen most over lateral malleolus, but also slightly at medial ankle,  Achilles intact.  Joint intact and located.  Foot without significant tenderness, no swelling or bruising in foot.  [facility] reading (PRIMARY) by  Dr. Lorelei Pont.  Negative right foot and ankle RIGHT ANKLE - COMPLETE 3+ VIEW  Comparison: None.  Findings: No acute bony abnormality. Specifically, no fracture, subluxation, or dislocation. Soft tissues are intact.  IMPRESSION: No acute bony abnormality.  RIGHT ANKLE -  COMPLETE 3+ VIEW  Comparison: None.  Findings: No acute bony abnormality. Specifically, no fracture, subluxation, or dislocation. Soft tissues are intact.  IMPRESSION: No acute bony abnormality.  Assessment and Plan: 1. Fall    2. Pain, ankle  DG Ankle Complete Right, DG Foot Complete Right   Ankle sprain.  Gave aircast and crutches.  Use these PRN for pain.  Elevate and ice.  If not better by the end of the week please let me know- Sooner if worse.     Lamar Blinks, MD

## 2012-02-29 ENCOUNTER — Other Ambulatory Visit (HOSPITAL_COMMUNITY): Payer: Self-pay | Admitting: Cardiovascular Disease

## 2012-02-29 DIAGNOSIS — R079 Chest pain, unspecified: Secondary | ICD-10-CM

## 2012-03-20 ENCOUNTER — Inpatient Hospital Stay (HOSPITAL_COMMUNITY): Admission: RE | Admit: 2012-03-20 | Payer: BC Managed Care – PPO | Source: Ambulatory Visit

## 2012-03-29 ENCOUNTER — Encounter (HOSPITAL_COMMUNITY): Payer: BC Managed Care – PPO

## 2012-04-05 ENCOUNTER — Ambulatory Visit (HOSPITAL_COMMUNITY)
Admission: RE | Admit: 2012-04-05 | Discharge: 2012-04-05 | Disposition: A | Discharge status: Home / Self Care | Payer: BC Managed Care – PPO | Source: Ambulatory Visit | Attending: Cardiovascular Disease | Admitting: Cardiovascular Disease

## 2012-04-05 DIAGNOSIS — R079 Chest pain, unspecified: Secondary | ICD-10-CM

## 2012-04-05 MED ORDER — TECHNETIUM TC 99M SESTAMIBI GENERIC - CARDIOLITE
10.4000 | Freq: Once | INTRAVENOUS | Status: AC | PRN
  Administered 2012-04-05: 10 via INTRAVENOUS

## 2012-04-05 MED ORDER — REGADENOSON 0.4 MG/5ML IV SOLN
0.4000 mg | Freq: Once | INTRAVENOUS | Status: AC
  Administered 2012-04-05: 0.4 mg via INTRAVENOUS

## 2012-04-05 MED ORDER — TECHNETIUM TC 99M SESTAMIBI GENERIC - CARDIOLITE
30.8000 | Freq: Once | INTRAVENOUS | Status: AC | PRN
  Administered 2012-04-05: 30.8 via INTRAVENOUS

## 2012-04-05 NOTE — Procedures (Addendum)
[facility] [address] [place] [address] [phone]  Cardiology Nuclear Med Study  Katelyn Mcclain is a 61 y.o. female     MRN : [id number]     DOB: 12-02-1950  Procedure Date: 04/05/2012  Nuclear Med Background Indication for Stress Test:  Evaluation for Ischemia History:  No prior cardiac history Cardiac Risk Factors: Family History - CAD and Lipids  Symptoms:  Chest Pain, Dizziness and SOB   Nuclear Pre-Procedure Caffeine/Decaff Intake:  7:00pm NPO After: 7:00am   IV Site: R Antecubital  IV 0.9% NS with Angio Cath:  22g  Chest Size (in):  n/a IV Started by: Otho Perl, CNMT  Height: 5' 9"  (1.753 m)  Cup Size: C  BMI:  Body mass index is 25.10 kg/(m^2). Weight:  170 lb (77.111 kg)   Tech Comments:  n/a    Nuclear Med Study 1 or 2 day study: 1 day  Stress Test Type:  [place]  Order Authorizing Provider:  Quay Burow, MD   Resting Radionuclide: Technetium 71mSestamibi  Resting Radionuclide Dose: 10.4 mCi   Stress Radionuclide:  Technetium 948mestamibi  Stress Radionuclide Dose: 30.8 mCi           Stress Protocol Rest HR: 53 Stress HR: 76  Rest BP: 113/77 Stress BP: 132/83  Exercise Time (min): n/a METS: n/a          Dose of Adenosine (mg):  n/a Dose of Lexiscan: 0.4 mg  Dose of Atropine (mg): n/a Dose of Dobutamine: n/a mcg/kg/min (at max HR)  Stress Test Technologist: GwMellody MemosCCT Nuclear Technologist: PaImagene RichesCNMT   Rest Procedure:  Myocardial perfusion imaging was performed at rest 45 minutes following the intravenous administration of Technetium 9920mstamibi. Stress Procedure:  The patient received IV Lexiscan 0.4 mg over 15-seconds.  Technetium 56m34mtamibi injected at 30-seconds.  There were no significant changes with Lexiscan.  Quantitative spect images were obtained after a 45 minute delay.  Transient Ischemic Dilatation (Normal <1.22):  1.08 Lung/Heart Ratio  (Normal <0.45):  0.29 QGS EDV:  53 ml QGS ESV:  13 ml LV Ejection Fraction: 76%  Signed by      Rest ECG: NSR - Normal EKG  Stress ECG: No significant change from baseline ECG  QPS Raw Data Images:  Normal; no motion artifact; normal heart/lung ratio. Stress Images:  Normal homogeneous uptake in all areas of the myocardium. Rest Images:  Normal homogeneous uptake in all areas of the myocardium. Subtraction (SDS):  No evidence of ischemia.  Impression Exercise Capacity:  Lexiscan with no exercise. BP Response:  Normal blood pressure response. Clinical Symptoms:  No significant symptoms noted. ECG Impression:  No significant ST segment change suggestive of ischemia. Comparison with Prior Nuclear Study: No significant change from previous study  Overall Impression:  Normal stress nuclear study.  LV Wall Motion:  NL LV Function; NL Wall Motion   Katelyn Mcclain  04/05/2012 6:05 PM

## 2012-08-05 ENCOUNTER — Encounter: Payer: Self-pay | Admitting: Cardiovascular Disease

## 2013-05-11 ENCOUNTER — Ambulatory Visit (INDEPENDENT_AMBULATORY_CARE_PROVIDER_SITE_OTHER): Payer: BC Managed Care – PPO | Admitting: Physician Assistant

## 2013-05-11 VITALS — BP 122/60 | HR 82 | Temp 97.9°F | Resp 18 | Ht 69.0 in | Wt 170.0 lb

## 2013-05-11 DIAGNOSIS — R05 Cough: Secondary | ICD-10-CM

## 2013-05-11 DIAGNOSIS — R5381 Other malaise: Secondary | ICD-10-CM

## 2013-05-11 DIAGNOSIS — J45901 Unspecified asthma with (acute) exacerbation: Secondary | ICD-10-CM

## 2013-05-11 DIAGNOSIS — D72829 Elevated white blood cell count, unspecified: Secondary | ICD-10-CM

## 2013-05-11 DIAGNOSIS — R5383 Other fatigue: Secondary | ICD-10-CM

## 2013-05-11 DIAGNOSIS — R059 Cough, unspecified: Secondary | ICD-10-CM

## 2013-05-11 LAB — POCT INFLUENZA A/B
INFLUENZA A, POC: NEGATIVE
Influenza B, POC: NEGATIVE

## 2013-05-11 LAB — POCT CBC
GRANULOCYTE PERCENT: 69.2 % (ref 37–80)
HEMATOCRIT: 43.5 % (ref 37.7–47.9)
HEMOGLOBIN: 13.9 g/dL (ref 12.2–16.2)
Lymph, poc: 2.5 (ref 0.6–3.4)
MCH, POC: 31 pg (ref 27–31.2)
MCHC: 32 g/dL (ref 31.8–35.4)
MCV: 97.1 fL — AB (ref 80–97)
MID (cbc): 0.7 (ref 0–0.9)
MPV: 8.9 fL (ref 0–99.8)
POC GRANULOCYTE: 7.1 — AB (ref 2–6.9)
POC LYMPH PERCENT: 24.2 %L (ref 10–50)
POC MID %: 6.6 %M (ref 0–12)
Platelet Count, POC: 292 10*3/uL (ref 142–424)
RBC: 4.48 M/uL (ref 4.04–5.48)
RDW, POC: 13.8 %
WBC: 10.3 10*3/uL — AB (ref 4.6–10.2)

## 2013-05-11 MED ORDER — AZITHROMYCIN 250 MG PO TABS: ORAL_TABLET | ORAL | Status: DC

## 2013-05-11 MED ORDER — HYDROCODONE-HOMATROPINE 5-1.5 MG/5ML PO SYRP: ORAL_SOLUTION | ORAL | Status: DC

## 2013-05-11 MED ORDER — PREDNISONE 20 MG PO TABS: ORAL_TABLET | ORAL | Status: DC

## 2013-05-11 MED ORDER — IPRATROPIUM BROMIDE 0.02 % IN SOLN: 0.5000 mg | Freq: Once | RESPIRATORY_TRACT | Status: AC

## 2013-05-11 MED ORDER — IPRATROPIUM BROMIDE 0.02 % IN SOLN: 0.5000 mg | Freq: Four times a day (QID) | RESPIRATORY_TRACT | Status: DC

## 2013-05-11 MED ORDER — ALBUTEROL SULFATE (2.5 MG/3ML) 0.083% IN NEBU
2.5000 mg | INHALATION_SOLUTION | Freq: Once | RESPIRATORY_TRACT | Status: AC
  Administered 2013-05-11: 2.5 mg via RESPIRATORY_TRACT

## 2013-05-11 MED ORDER — ALBUTEROL SULFATE (2.5 MG/3ML) 0.083% IN NEBU: 2.5000 mg | INHALATION_SOLUTION | Freq: Four times a day (QID) | RESPIRATORY_TRACT | Status: DC | PRN

## 2013-05-11 MED ORDER — BENZONATATE 200 MG PO CAPS: 200.0000 mg | ORAL_CAPSULE | Freq: Two times a day (BID) | ORAL | Status: DC | PRN

## 2013-05-11 MED ORDER — ALBUTEROL SULFATE (2.5 MG/3ML) 0.083% IN NEBU: 2.5000 mg | INHALATION_SOLUTION | Freq: Once | RESPIRATORY_TRACT | Status: DC

## 2013-05-11 NOTE — Progress Notes (Signed)
Subjective:    Patient ID: Katelyn Mcclain, female    DOB: [birth year], 63 y.o.   MRN: [id number]  HPI 63 year old female presents with a 5 day history of nasal congestion, sore throat, cough, rhinorrhea, fever, chills, fatigue, and myalgias. Cough is not productive and not associated with time of day. No SOB or wheezing at this time. No sinus pressure. Subjective fever and chills. Nasal congestion thick and green/yellow. Ears feel full, leading to sensation of muffled hearing. The above has triggered an increase in her migraines. Has tried OTC cold preps without success. She also has a nebulizer at home, but her albuterol nebules are out of date and she has not used them. No GI complaints. Appetite decreased. No recent antibiotics or recent travels. No leg trauma, sedentary periods, h/o cancer, or tobacco use.  Asthma is well controlled at baseline. Typically only with a flare up during this time of year. Currently taking Symbicort 160/4.5, Advair 100/50, and Singulair 10mg  for her breathing.   PMH: Past Medical History  Diagnosis Date  . Celiac disease   . Gastroparesis   . Asthma   . Migraine   . Asthma     inhaler prn     Home Meds: Prior to Admission medications   Medication Sig Start Date End Date Taking? Authorizing Provider  baclofen (LIORESAL) 10 MG tablet Take 10 mg by mouth 3 (three) times daily.   Yes Historical Provider, MD  budesonide-formoterol (SYMBICORT) 160-4.5 MCG/ACT inhaler Inhale 2 puffs into the lungs 2 (two) times daily.   Yes Historical Provider, MD  Dexlansoprazole 30 MG capsule Take 30 mg by mouth daily.     Yes Historical Provider, MD  escitalopram (LEXAPRO) 20 MG tablet Take 30 mg by mouth daily.     Yes Historical Provider, MD  eszopiclone (LUNESTA) 1 MG TABS tablet Take 1 mg by mouth at bedtime as needed for sleep. Take immediately before bedtime   Yes Historical Provider, MD  folic acid (FOLVITE) 1 MG tablet Take 1 mg by mouth daily.     Yes Historical  Provider, MD  LORazepam (ATIVAN) 0.5 MG tablet Take 0.5 mg by mouth every 8 (eight) hours.   Yes Historical Provider, MD  Misc Natural Products (OSTEO BI-FLEX ADV JOINT SHIELD PO) Take 1 tablet by mouth daily.     Yes Historical Provider, MD  montelukast (SINGULAIR) 10 MG tablet Take 10 mg by mouth at bedtime.     Yes Historical Provider, MD  rizatriptan (MAXALT) 10 MG tablet Take 10 mg by mouth as needed for migraine. May repeat in 2 hours if needed   Yes Historical Provider, MD  buPROPion (WELLBUTRIN XL) 150 MG 24 hr tablet Take 150 mg by mouth daily.      Historical Provider, MD  calcium carbonate (OS-CAL) 600 MG TABS Take 600 mg by mouth daily.      Historical Provider, MD  fish oil-omega-3 fatty acids 1000 MG capsule Take 2 g by mouth daily.      Historical Provider, MD  Fluticasone-Salmeterol (ADVAIR) 100-50 MCG/DOSE AEPB Inhale 1 puff into the lungs every 12 (twelve) hours.      Historical Provider, MD    Allergies:  Allergies  Allergen Reactions  . Morphine And Related Other (See Comments)    Sensitive to narcotics, worsening migraines.  . Adhesive [Tape]     History   Social History  . Marital Status: Married    Spouse Name: N/A    Number of Children:  N/A  . Years of Education: N/A   Occupational History  . Not on file.   Social History Main Topics  . Smoking status: Former Research scientist (life sciences)  . Smokeless tobacco: Not on file  . Alcohol Use: No  . Drug Use: No  . Sexual Activity: Yes    Birth Control/ Protection: Post-menopausal   Other Topics Concern  . Not on file   Social History Narrative  . No narrative on file       Review of Systems  Constitutional: Positive for fever, chills, appetite change and fatigue.       Subjective fever.   HENT: Positive for congestion, ear pain, hearing loss, rhinorrhea and sore throat. Negative for postnasal drip, sinus pressure and sneezing.        Nasal congestion.   Respiratory: Positive for cough. Negative for shortness of breath  and wheezing.        Cough is not productive. Cough is not associated with time of day.   Gastrointestinal: Negative for nausea, vomiting and diarrhea.  Musculoskeletal: Positive for myalgias.  Neurological: Positive for headaches.       Objective:   Physical Exam  Physical Exam: Blood pressure 122/60, pulse 82, temperature 97.9 F (36.6 C), temperature source Oral, resp. rate 18, height 5\' 9"  (1.753 m), weight 170 lb (77.111 kg), SpO2 99.00%., Body mass index is 25.09 kg/(m^2). General: Well developed, well nourished, in no acute distress. Head: Normocephalic, atraumatic, eyes without discharge, sclera non-icteric, nares are congested. Bilateral auditory canals clear, TM's are without perforation, pearly grey with reflective cone of light bilaterally. No sinus TTP. Oral cavity moist, dentition normal. Posterior pharynx with post nasal drip and mild erythema. No peritonsillar abscess or tonsillar exudate. Uvula midline.  Neck: Supple. No thyromegaly. Full ROM. No lymphadenopathy. Lungs: Coarse breath sounds bilaterally with mild wheezing bilaterally. No rales or rhonchi. Breathing is unlabored. Status post albuterol and atrovent neb patient feels: much better. Wheezing resolved.  Heart: RRR with S1 S2. No murmurs, rubs, or gallops appreciated. Msk:  Strength and tone normal for age. Extremities: No clubbing or cyanosis. No edema. Neuro: Alert and oriented X 3. Moves all extremities spontaneously. CNII-XII grossly in tact. Psych:  Responds to questions appropriately with a normal affect.   Labs: Results for orders placed in visit on 05/11/13  POCT INFLUENZA A/B      Result Value Range   Influenza A, POC Negative     Influenza B, POC Negative    POCT CBC      Result Value Range   WBC 10.3 (*) 4.6 - 10.2 K/uL   Lymph, poc 2.5  0.6 - 3.4   POC LYMPH PERCENT 24.2  10 - 50 %L   MID (cbc) 0.7  0 - 0.9   POC MID % 6.6  0 - 12 %M   POC Granulocyte 7.1 (*) 2 - 6.9   Granulocyte percent  69.2  37 - 80 %G   RBC 4.48  4.04 - 5.48 M/uL   Hemoglobin 13.9  12.2 - 16.2 g/dL   HCT, POC 43.5  37.7 - 47.9 %   MCV 97.1 (*) 80 - 97 fL   MCH, POC 31.0  27 - 31.2 pg   MCHC 32.0  31.8 - 35.4 g/dL   RDW, POC 13.8     Platelet Count, POC 292  142 - 424 K/uL   MPV 8.9  0 - 99.8 fL        Assessment & Plan:  62 year  old female with acute asthma exacerbation secondary to URI -Albuterol and Atrovent neb in office -Albuterol neb in office -Azithromycin 250 MG #6 2 po first day then 1 po next 4 days no RF -Prednisone 20 mg #18 3x3, 2x3, 1x3 no RF -Albuterol neb 1 neb inhaled q 6 hours #1 RF 12 -Atrovent neb 1 neb inhaled q 6 hours #1 RF 12 -Hycodan #4oz 1 tsp po q 4-6 hours prn cough no RF SED, she does ok with hydrocone cough syrups -Tessalon Perles 200 mg 1 po tid prn cough #60 no RF -Rest -RTC precautions -Follow up with PCP as directed    Christell Faith, MHS, PA-C [facility] [place], [place] [id number] [facility] 05/11/2013 9:30 AM

## 2013-05-16 ENCOUNTER — Encounter: Payer: Self-pay | Admitting: Neurology

## 2013-05-16 ENCOUNTER — Ambulatory Visit: Payer: BC Managed Care – PPO | Admitting: Cardiovascular Disease

## 2013-05-20 ENCOUNTER — Ambulatory Visit (INDEPENDENT_AMBULATORY_CARE_PROVIDER_SITE_OTHER): Payer: BC Managed Care – PPO | Admitting: Neurology

## 2013-05-20 ENCOUNTER — Encounter (INDEPENDENT_AMBULATORY_CARE_PROVIDER_SITE_OTHER): Payer: Self-pay

## 2013-05-20 ENCOUNTER — Encounter: Payer: Self-pay | Admitting: Neurology

## 2013-05-20 VITALS — BP 109/69 | HR 76 | Ht 70.5 in | Wt 176.0 lb

## 2013-05-20 DIAGNOSIS — G43019 Migraine without aura, intractable, without status migrainosus: Secondary | ICD-10-CM

## 2013-05-20 HISTORY — DX: Migraine without aura, intractable, without status migrainosus: G43.019

## 2013-05-20 MED ORDER — ZONISAMIDE 100 MG PO CAPS: 100.0000 mg | ORAL_CAPSULE | Freq: Every day | ORAL | Status: DC

## 2013-05-20 MED ORDER — ZONISAMIDE 25 MG PO CAPS: ORAL_CAPSULE | ORAL | Status: DC

## 2013-05-20 NOTE — Progress Notes (Signed)
Reason for visit: Headache  Katelyn Mcclain is a 63 y.o. female  History of present illness:  Katelyn Mcclain is a 63 year old right-handed white female with a history of migraine headaches. The patient has had migraine off and on since 1989. At times, the headaches have been quite frequent. In November 2014, her parents moved into her home, and her father has a history of dementia. This has put significant stress on her, and the patient began having headaches that have been virtually daily for a month into November 2014. The patient has had fewer headaches more recently, but the patient still has had only 13 headache free days within the last month. The patient was seen through the [facility], and the patient was placed on Zonegran. The patient received trigger point injections, and she was placed on baclofen. The patient has tension in the shoulders, and baclofen has helped this issue. The patient denies any neck stiffness. The headaches are mainly bitemporal in nature associated with visual aura, nausea vomiting, and true vertigo. The patient denies any focal numbness or weakness of the face, arms, or legs. The patient takes some Phenergan for the nausea. The patient has Imitrex nasal spray to take for the headaches. The patient believes that the Zonegran at 100 mg daily has helped some. The patient recently has had a lot of sinus infections, currently on antibiotics. The patient has a history of asthma. The patient is sent to this office for further evaluation. The patient is not sleeping well, and she has gained some benefit with low-dose alprazolam. In the past, the patient has had issues with weight gain on amitriptyline. The patient currently is on Lexapro taking 30 mg daily.  Past Medical History  Diagnosis Date  . Celiac disease   . Gastroparesis   . Asthma   . Migraine   . Asthma     inhaler prn  . GERD (gastroesophageal reflux disease)   . IBS (irritable bowel syndrome)    . Allergic rhinitis   . Osteopenia   . Dysmenorrhea   . Hx of low back pain   . Migraine without aura, with intractable migraine, so stated, without mention of status migrainosus 05/20/2013  . Depression with anxiety     Past Surgical History  Procedure Laterality Date  . Colonoscopy    . Abdominal surgery    . Mass excision  08/08/2011    Procedure: MINOR EXCISION OF MASS;  Surgeon: Cammie Sickle., MD;  Location: [facility];  Service: Orthopedics;  Laterality: Left;  left ring excision cyst proximal phalangeal joint  . Breast biopsy Right     x2  . Nissen fundoplication    . Foot surgery Left     Bunionectomy  . Tonsillectomy      Family History  Problem Relation Age of Onset  . Stroke Father   . Dementia Father   . Atrial fibrillation Father   . Cancer Father   . Hypertension Mother   . Scoliosis Mother   . Heart disease Sister     Congenital heart disease  . Migraines Neg Hx     Social history:  reports that she has quit smoking. She has never used smokeless tobacco. She reports that she drinks alcohol. She reports that she does not use illicit drugs.  Medications:  Current Outpatient Prescriptions on File Prior to Visit  Medication Sig Dispense Refill  . albuterol (PROVENTIL) (2.5 MG/3ML) 0.083% nebulizer solution Take 3 mLs (2.5  mg total) by nebulization every 6 (six) hours as needed for wheezing or shortness of breath.  75 mL  12  . baclofen (LIORESAL) 10 MG tablet Take 10 mg by mouth 3 (three) times daily.      . benzonatate (TESSALON) 200 MG capsule Take 1 capsule (200 mg total) by mouth 2 (two) times daily as needed for cough.  60 capsule  0  . calcium carbonate (OS-CAL) 600 MG TABS Take 600 mg by mouth daily.        Marland Kitchen Dexlansoprazole 30 MG capsule Take 30 mg by mouth daily.        Marland Kitchen escitalopram (LEXAPRO) 20 MG tablet Take 30 mg by mouth daily.        . eszopiclone (LUNESTA) 1 MG TABS tablet Take 1 mg by mouth at bedtime as needed for sleep.  Take immediately before bedtime      . fish oil-omega-3 fatty acids 1000 MG capsule Take 2 g by mouth daily.        . folic acid (FOLVITE) 1 MG tablet Take 1 mg by mouth daily.        Marland Kitchen HYDROcodone-homatropine (HYCODAN) 5-1.5 MG/5ML syrup 1 TSP PO Q 4-6 HOURS PRN COUGH  120 mL  0  . ipratropium (ATROVENT) 0.02 % nebulizer solution Take 2.5 mLs (0.5 mg total) by nebulization 4 (four) times daily.  75 mL  12  . Misc Natural Products (OSTEO BI-FLEX ADV JOINT SHIELD PO) Take 1 tablet by mouth daily.        . montelukast (SINGULAIR) 10 MG tablet Take 10 mg by mouth at bedtime.        . rizatriptan (MAXALT) 10 MG tablet Take 10 mg by mouth as needed for migraine. May repeat in 2 hours if needed      . Fluticasone-Salmeterol (ADVAIR) 100-50 MCG/DOSE AEPB Inhale 1 puff into the lungs every 12 (twelve) hours.         Current Facility-Administered Medications on File Prior to Visit  Medication Dose Route Frequency Provider Last Rate Last Dose  . albuterol (PROVENTIL) (2.5 MG/3ML) 0.083% nebulizer solution 2.5 mg  2.5 mg Nebulization Once Ryan M Dunn, PA-C      . ipratropium (ATROVENT) nebulizer solution 0.5 mg  0.5 mg Nebulization Once Rise Mu, PA-C          Allergies  Allergen Reactions  . Morphine And Related Other (See Comments)    Sensitive to narcotics, worsening migraines.  . Adhesive [Tape]     ROS:  Out of a complete 14 system review of symptoms, the patient complains only of the following symptoms, and all other reviewed systems are negative.  Shortness of breath, cough, wheezing Constipation Allergies Confusion, headache Depression, anxiety, racing thoughts Insomnia  Blood pressure 109/69, pulse 76, height 5' 10.5" (1.791 m), weight 176 lb (79.833 kg).  Physical Exam  General: The patient is alert and cooperative at the time of the examination.  Eyes: Pupils are equal, round, and reactive to light. Discs are flat bilaterally.  Neck: The neck is supple, no carotid bruits  are noted.  Respiratory: The respiratory examination is clear.  Cardiovascular: The cardiovascular examination reveals a regular rate and rhythm, no obvious murmurs or rubs are noted.  Skin: Extremities are without significant edema.  Neurologic Exam  Mental status: The patient is alert and oriented x 3 at the time of the examination. The patient has apparent normal recent and remote memory, with an apparently normal attention span and concentration  ability.  Cranial nerves: Facial symmetry is present. There is good sensation of the face to pinprick and soft touch bilaterally. The strength of the facial muscles and the muscles to head turning and shoulder shrug are normal bilaterally. Speech is well enunciated, no aphasia or dysarthria is noted. Extraocular movements are full. Visual fields are full. The tongue is midline, and the patient has symmetric elevation of the soft palate. No obvious hearing deficits are noted.  Motor: The motor testing reveals 5 over 5 strength of all 4 extremities. Good symmetric motor tone is noted throughout.  Sensory: Sensory testing is intact to pinprick, soft touch, vibration sensation, and position sense on all 4 extremities. No evidence of extinction is noted.  Coordination: Cerebellar testing reveals good finger-nose-finger and heel-to-shin bilaterally.  Gait and station: Gait is normal. Tandem gait is normal. Romberg is negative. No drift is seen.  Reflexes: Deep tendon reflexes are symmetric and normal bilaterally. Toes are downgoing bilaterally.   Assessment/Plan:  1. Migraine headache, converted migraine  The patient has significant issues with headaches at this point. The patient is having greater than 15 headaches a month, and in the future she may be a candidate for Botox injections. The patient has gained some benefit with the Zonegran, and this dose will be increased to 150 mg total daily dose. The patient will go on magnesium supplementation  and riboflavin. The patient may take Imitrex if needed. The patient will followup in about 3 months. The patient will contact me if her headaches continue. The patient recently went on some prednisone, and this transiently helped her headaches. Ultimately, alleviation of stress in her home environment will be the likely cure for her headache.  Jill Alexanders MD 05/20/2013 7:53 PM  [facility] [address] [place], [place] [address]  Phone [phone] Fax [phone]

## 2013-05-20 NOTE — Patient Instructions (Signed)
Begin magnesium oxide 240 mg at night. Riboflavin 400 mg daily. Begin zonegran at 25 mg in the morning for one week, then take 50 mg in the morning.   Migraine Headache A migraine headache is an intense, throbbing pain on one or both sides of your head. A migraine can last for 30 minutes to several hours. CAUSES  The exact cause of a migraine headache is not always known. However, a migraine may be caused when nerves in the brain become irritated and release chemicals that cause inflammation. This causes pain. Certain things may also trigger migraines, such as:  Alcohol.  Smoking.  Stress.  Menstruation.  Aged cheeses.  Foods or drinks that contain nitrates, glutamate, aspartame, or tyramine.  Lack of sleep.  Chocolate.  Caffeine.  Hunger.  Physical exertion.  Fatigue.  Medicines used to treat chest pain (nitroglycerine), birth control pills, estrogen, and some blood pressure medicines. SIGNS AND SYMPTOMS  Pain on one or both sides of your head.  Pulsating or throbbing pain.  Severe pain that prevents daily activities.  Pain that is aggravated by any physical activity.  Nausea, vomiting, or both.  Dizziness.  Pain with exposure to bright lights, loud noises, or activity.  General sensitivity to bright lights, loud noises, or smells. Before you get a migraine, you may get warning signs that a migraine is coming (aura). An aura may include:  Seeing flashing lights.  Seeing bright spots, halos, or zig-zag lines.  Having tunnel vision or blurred vision.  Having feelings of numbness or tingling.  Having trouble talking.  Having muscle weakness. DIAGNOSIS  A migraine headache is often diagnosed based on:  Symptoms.  Physical exam.  A CT scan or MRI of your head. These imaging tests cannot diagnose migraines, but they can help rule out other causes of headaches. TREATMENT Medicines may be given for pain and nausea. Medicines can also be given to help  prevent recurrent migraines.  HOME CARE INSTRUCTIONS  Only take over-the-counter or prescription medicines for pain or discomfort as directed by your health care provider. The use of long-term narcotics is not recommended.  Lie down in a dark, quiet room when you have a migraine.  Keep a journal to find out what may trigger your migraine headaches. For example, write down:  What you eat and drink.  How much sleep you get.  Any change to your diet or medicines.  Limit alcohol consumption.  Quit smoking if you smoke.  Get 7 9 hours of sleep, or as recommended by your health care provider.  Limit stress.  Keep lights dim if bright lights bother you and make your migraines worse. SEEK IMMEDIATE MEDICAL CARE IF:   Your migraine becomes severe.  You have a fever.  You have a stiff neck.  You have vision loss.  You have muscular weakness or loss of muscle control.  You start losing your balance or have trouble walking.  You feel faint or pass out.  You have severe symptoms that are different from your first symptoms. MAKE SURE YOU:   Understand these instructions.  Will watch your condition.  Will get help right away if you are not doing well or get worse. Document Released: 03/27/2005 Document Revised: 01/15/2013 Document Reviewed: 12/02/2012 [facility] Patient Information 2014 [place].

## 2013-06-10 ENCOUNTER — Encounter: Payer: Self-pay | Admitting: Cardiovascular Disease

## 2013-06-10 ENCOUNTER — Ambulatory Visit (INDEPENDENT_AMBULATORY_CARE_PROVIDER_SITE_OTHER): Payer: BC Managed Care – PPO | Admitting: Cardiovascular Disease

## 2013-06-10 VITALS — BP 120/76 | HR 64 | Resp 16 | Ht 70.0 in | Wt 173.4 lb

## 2013-06-10 DIAGNOSIS — R079 Chest pain, unspecified: Secondary | ICD-10-CM

## 2013-06-10 NOTE — Patient Instructions (Signed)
Your physician recommends that you schedule a follow-up appointment in: AS NEEDED  

## 2013-06-13 ENCOUNTER — Telehealth: Payer: Self-pay | Admitting: Neurology

## 2013-06-13 ENCOUNTER — Other Ambulatory Visit: Payer: Self-pay

## 2013-06-13 MED ORDER — ZONISAMIDE 25 MG PO CAPS: 50.0000 mg | ORAL_CAPSULE | ORAL | Status: DC

## 2013-06-13 MED ORDER — ZONISAMIDE 100 MG PO CAPS
100.0000 mg | ORAL_CAPSULE | Freq: Every day | ORAL | Status: DC
Start: 2013-06-13 — End: 2013-12-09

## 2013-06-13 MED ORDER — ZONISAMIDE 100 MG PO CAPS: 100.0000 mg | ORAL_CAPSULE | Freq: Every day | ORAL | Status: DC

## 2013-06-13 NOTE — Telephone Encounter (Signed)
Rx has been sent.  I called the patient.  She is aware.  

## 2013-06-13 NOTE — Telephone Encounter (Signed)
Pt called needs to get a 14 day supply on her zonisamide (ZONEGRAN) 100 MG capsule at [facility] due to [facility] states it will take them 2 weeks longer the first time sending out. Please call pt to confirm this information.

## 2013-06-15 ENCOUNTER — Encounter: Payer: Self-pay | Admitting: Cardiovascular Disease

## 2013-06-15 DIAGNOSIS — R079 Chest pain, unspecified: Secondary | ICD-10-CM | POA: Insufficient documentation

## 2013-06-15 NOTE — Progress Notes (Signed)
Patient ID: Katelyn Mcclain, female   DOB: [birth year], 63 y.o.   MRN: [id number]     Reason for office visit Chest pain  Katelyn Mcclain is a 63 year old woman who presents for evaluation of chest pain. This has occurred when she is in emotional upheaval. She has been trying to take care of her father in [place]. Her mother is very ill. On the other hand the other day when she was shoveling snow she did not experience any chest discomfort. Physical exertion does not cause any symptoms. She had a normal nuclear stress test in December of 2013. She does not have any other cardiac complaints. She has well-controlled asthma and migraines. Treatment with triptan medications has not seemed to trigger any chest discomfort.   Allergies  Allergen Reactions  . Morphine And Related Other (See Comments)    Sensitive to narcotics, worsening migraines.  . Adhesive [Tape]     Current Outpatient Prescriptions  Medication Sig Dispense Refill  . ALPRAZolam (XANAX) 0.25 MG tablet Take 0.25 mg by mouth daily as needed.      . baclofen (LIORESAL) 10 MG tablet Take 10 mg by mouth 3 (three) times daily as needed.       . calcium carbonate (OS-CAL) 600 MG TABS Take 600 mg by mouth daily.        Marland Kitchen escitalopram (LEXAPRO) 20 MG tablet Take 30 mg by mouth daily.        . eszopiclone (LUNESTA) 1 MG TABS tablet Take 1 mg by mouth at bedtime as needed for sleep. Take immediately before bedtime      . fish oil-omega-3 fatty acids 1000 MG capsule Take 2 g by mouth daily.        . folic acid (FOLVITE) 1 MG tablet Take 1 mg by mouth daily.        . Misc Natural Products (OSTEO BI-FLEX ADV JOINT SHIELD PO) Take 1 tablet by mouth daily.        . promethazine (PHENERGAN) 25 MG tablet Take 25 mg by mouth daily as needed.      . rizatriptan (MAXALT) 10 MG tablet Take 10 mg by mouth as needed for migraine. May repeat in 2 hours if needed      . SUMAtriptan (IMITREX) 20 MG/ACT nasal spray Place 20 mg into the nose as needed.       . clotrimazole (MYCELEX) 10 MG troche Take 10 mg by mouth 5 (five) times daily.      . pantoprazole (PROTONIX) 40 MG tablet Take 40 mg by mouth daily.      Marland Kitchen zonisamide (ZONEGRAN) 100 MG capsule Take 1 capsule (100 mg total) by mouth at bedtime.  15 capsule  1  . zonisamide (ZONEGRAN) 25 MG capsule Take 2 capsules (50 mg total) by mouth every morning.  30 capsule  1   Current Facility-Administered Medications  Medication Dose Route Frequency Provider Last Rate Last Dose  . albuterol (PROVENTIL) (2.5 MG/3ML) 0.083% nebulizer solution 2.5 mg  2.5 mg Nebulization Once Ryan M Dunn, PA-C      . ipratropium (ATROVENT) nebulizer solution 0.5 mg  0.5 mg Nebulization Once Rise Mu, PA-C        Past Medical History  Diagnosis Date  . Celiac disease   . Gastroparesis   . Asthma   . Migraine   . Asthma     inhaler prn  . GERD (gastroesophageal reflux disease)   . IBS (irritable bowel syndrome)   . Allergic  rhinitis   . Osteopenia   . Dysmenorrhea   . Hx of low back pain   . Migraine without aura, with intractable migraine, so stated, without mention of status migrainosus 05/20/2013  . Depression with anxiety     Past Surgical History  Procedure Laterality Date  . Colonoscopy    . Abdominal surgery    . Mass excision  08/08/2011    Procedure: MINOR EXCISION OF MASS;  Surgeon: Cammie Sickle., MD;  Location: [facility];  Service: Orthopedics;  Laterality: Left;  left ring excision cyst proximal phalangeal joint  . Breast biopsy Right     x2  . Nissen fundoplication    . Foot surgery Left     Bunionectomy  . Tonsillectomy      Family History  Problem Relation Age of Onset  . Stroke Father   . Dementia Father   . Atrial fibrillation Father   . Cancer Father   . Hypertension Mother   . Scoliosis Mother   . Heart disease Sister     Congenital heart disease  . Migraines Neg Hx     History   Social History  . Marital Status: Married    Spouse Name: N/A     Number of Children: 0  . Years of Education: college   Occupational History  .      [employer]   Social History Main Topics  . Smoking status: Former Research scientist (life sciences)  . Smokeless tobacco: Never Used  . Alcohol Use: Yes     Comment: wine occasionally  . Drug Use: No  . Sexual Activity: Yes    Birth Control/ Protection: Post-menopausal   Other Topics Concern  . Not on file   Social History Narrative  . No narrative on file    Review of systems: The patient specifically denies any chest pain with exertion, dyspnea at rest or with exertion, orthopnea, paroxysmal nocturnal dyspnea, syncope, palpitations, focal neurological deficits, intermittent claudication, lower extremity edema, unexplained weight gain, cough, hemoptysis or wheezing.  The patient also denies abdominal pain, nausea, vomiting, dysphagia, diarrhea, constipation, polyuria, polydipsia, dysuria, hematuria, frequency, urgency, abnormal bleeding or bruising, fever, chills, unexpected weight changes, mood swings, change in skin or hair texture, change in voice quality, auditory or visual problems, allergic reactions or rashes, new musculoskeletal complaints other than usual "aches and pains".   PHYSICAL EXAM BP 120/76  Pulse 64  Resp 16  Ht 5' 10"  (1.778 m)  Wt 78.654 kg (173 lb 6.4 oz)  BMI 24.88 kg/m2  General: Alert, oriented x3, no distress Head: no evidence of trauma, PERRL, EOMI, no exophtalmos or lid lag, no myxedema, no xanthelasma; normal ears, nose and oropharynx Neck: normal jugular venous pulsations and no hepatojugular reflux; brisk carotid pulses without delay and no carotid bruits Chest: clear to auscultation, no signs of consolidation by percussion or palpation, normal fremitus, symmetrical and full respiratory excursions Cardiovascular: normal position and quality of the apical impulse, regular rhythm, normal first and second heart sounds, no murmurs, rubs or gallops Abdomen: no tenderness or  distention, no masses by palpation, no abnormal pulsatility or arterial bruits, normal bowel sounds, no hepatosplenomegaly Extremities: no clubbing, cyanosis or edema; 2+ radial, ulnar and brachial pulses bilaterally; 2+ right femoral, posterior tibial and dorsalis pedis pulses; 2+ left femoral, posterior tibial and dorsalis pedis pulses; no subclavian or femoral bruits Neurological: grossly nonfocal   EKG: Normal sinus rhythm, poor R wave progression, no change since last tracing.  BMET  Component Value Date/Time   NA 137 03/12/2011 2250   K 3.1* 03/12/2011 2250   CL 102 03/12/2011 2250   CO2 24 03/12/2011 2250   GLUCOSE 92 03/12/2011 2250   BUN 8 03/12/2011 2250   CREATININE 0.91 03/12/2011 2250   CALCIUM 9.1 03/12/2011 2250   GFRNONAA 68* 03/12/2011 2250   GFRAA 78* 03/12/2011 2250     ASSESSMENT AND PLAN Chest pain Her chest discomfort does not have the typical pattern of cardiac etiology. It is also not triggered by vasoconstrictor medications. It seems to be triggered by emotional problems. She has a fairly recent nuclear stress test that was completely normal. I reassured her and I do not think further cardiac evaluation is necessary at this time. She'll followup as needed.   Orders Placed This Encounter  Procedures  . EKG 12-Lead   Patient Instructions  Your physician recommends that you schedule a follow-up appointment in: AS NEEDED.     Meds ordered this encounter  Medications  . clotrimazole (MYCELEX) 10 MG troche    Sig: Take 10 mg by mouth 5 (five) times daily.  . pantoprazole (PROTONIX) 40 MG tablet    Sig: Take 40 mg by mouth daily.    Holli Humbles, MD, [place] [phone] office [phone] pager

## 2013-06-15 NOTE — Assessment & Plan Note (Signed)
Her chest discomfort does not have the typical pattern of cardiac etiology. It is also not triggered by vasoconstrictor medications. It seems to be triggered by emotional problems. She has a fairly recent nuclear stress test that was completely normal. I reassured her and I do not think further cardiac evaluation is necessary at this time. She'll followup as needed.

## 2013-11-11 ENCOUNTER — Ambulatory Visit (INDEPENDENT_AMBULATORY_CARE_PROVIDER_SITE_OTHER): Payer: BC Managed Care – PPO | Admitting: Neurology

## 2013-11-11 ENCOUNTER — Encounter: Payer: Self-pay | Admitting: Neurology

## 2013-11-11 VITALS — BP 112/74 | HR 66 | Wt 166.0 lb

## 2013-11-11 DIAGNOSIS — G43019 Migraine without aura, intractable, without status migrainosus: Secondary | ICD-10-CM

## 2013-11-11 MED ORDER — RIZATRIPTAN BENZOATE 10 MG PO TABS: 10.0000 mg | ORAL_TABLET | Freq: Three times a day (TID) | ORAL | Status: DC | PRN

## 2013-11-11 NOTE — Patient Instructions (Signed)

## 2013-11-11 NOTE — Progress Notes (Signed)
Reason for visit: Migraine headache  Katelyn Mcclain is an 63 y.o. female  History of present illness:  Katelyn Mcclain is a 63 year old right-handed white female with a history of migraine headaches. When she was seen last, she was having only 13 headache free days a month. The patient indicates that now she is only getting 1 migraine a month. She has gained excellent improvement with Zonegran taking 100 mg at night and 50 mg in the morning. She takes Maxalt for the headache when it does occur with good improvement. The patient has had some issues with sleep, but the [name] has been very helpful in this regard. The patient is now trying to get on melatonin, and reduce the amount of Lunesta that she takes. Overall, she feels much better in regards to her headache issues. She indicates that bright lights, certain perfumes, and weather changes may bring on headaches.  Past Medical History  Diagnosis Date  . Celiac disease   . Gastroparesis   . Asthma   . Migraine   . Asthma     inhaler prn  . GERD (gastroesophageal reflux disease)   . IBS (irritable bowel syndrome)   . Allergic rhinitis   . Osteopenia   . Dysmenorrhea   . Hx of low back pain   . Migraine without aura, with intractable migraine, so stated, without mention of status migrainosus 05/20/2013  . Depression with anxiety     Past Surgical History  Procedure Laterality Date  . Colonoscopy    . Abdominal surgery    . Mass excision  08/08/2011    Procedure: MINOR EXCISION OF MASS;  Surgeon: Cammie Sickle., MD;  Location: [facility];  Service: Orthopedics;  Laterality: Left;  left ring excision cyst proximal phalangeal joint  . Breast biopsy Right     x2  . Nissen fundoplication    . Foot surgery Left     Bunionectomy  . Tonsillectomy      Family History  Problem Relation Age of Onset  . Stroke Father   . Dementia Father   . Atrial fibrillation Father   . Cancer Father   . Hypertension Mother     . Scoliosis Mother   . Heart disease Sister     Congenital heart disease  . Migraines Neg Hx     Social history:  reports that she has quit smoking. She has never used smokeless tobacco. She reports that she drinks alcohol. She reports that she does not use illicit drugs.    Allergies  Allergen Reactions  . Morphine And Related Other (See Comments)    Sensitive to narcotics, worsening migraines.  . Adhesive [Tape]     Medications:  Current Outpatient Prescriptions on File Prior to Visit  Medication Sig Dispense Refill  . ALPRAZolam (XANAX) 0.25 MG tablet Take 0.25 mg by mouth daily as needed.      . baclofen (LIORESAL) 10 MG tablet Take 10 mg by mouth 3 (three) times daily as needed.       . calcium carbonate (OS-CAL) 600 MG TABS Take 600 mg by mouth daily.        . clotrimazole (MYCELEX) 10 MG troche Take 10 mg by mouth 5 (five) times daily.      Marland Kitchen escitalopram (LEXAPRO) 20 MG tablet Take 30 mg by mouth daily.        . eszopiclone (LUNESTA) 1 MG TABS tablet Take 1 mg by mouth at bedtime as needed for sleep.  Take immediately before bedtime      . fish oil-omega-3 fatty acids 1000 MG capsule Take 2 g by mouth daily.        . folic acid (FOLVITE) 1 MG tablet Take 1 mg by mouth daily.        . Misc Natural Products (OSTEO BI-FLEX ADV JOINT SHIELD PO) Take 1 tablet by mouth daily.        . pantoprazole (PROTONIX) 40 MG tablet Take 40 mg by mouth daily.      . promethazine (PHENERGAN) 25 MG tablet Take 25 mg by mouth daily as needed.      . SUMAtriptan (IMITREX) 20 MG/ACT nasal spray Place 20 mg into the nose as needed.      . zonisamide (ZONEGRAN) 100 MG capsule Take 1 capsule (100 mg total) by mouth at bedtime.  15 capsule  1  . zonisamide (ZONEGRAN) 25 MG capsule Take 2 capsules (50 mg total) by mouth every morning.  30 capsule  1   Current Facility-Administered Medications on File Prior to Visit  Medication Dose Route Frequency Provider Last Rate Last Dose  . albuterol  (PROVENTIL) (2.5 MG/3ML) 0.083% nebulizer solution 2.5 mg  2.5 mg Nebulization Once Ryan M Dunn, PA-C      . ipratropium (ATROVENT) nebulizer solution 0.5 mg  0.5 mg Nebulization Once Ryan M Dunn, PA-C        ROS:  Out of a complete 14 system review of symptoms, the patient complains only of the following symptoms, and all other reviewed systems are negative.  Eye pain, blurred vision Insomnia Environmental allergies Dizziness, headache Joint pain  Blood pressure 112/74, pulse 66, weight 166 lb (75.297 kg).  Physical Exam  General: The patient is alert and cooperative at the time of the examination.  Neuromuscular: Range of movement of the cervical spine is full.  Skin: No significant peripheral edema is noted.   Neurologic Exam  Mental status: The patient is oriented x 3.  Cranial nerves: Facial symmetry is present. Speech is normal, no aphasia or dysarthria is noted. Extraocular movements are full. Visual fields are full.  Motor: The patient has good strength in all 4 extremities.  Sensory examination: Soft touch sensation is symmetric on the face, arms, and legs.  Coordination: The patient has good finger-nose-finger and heel-to-shin bilaterally.  Gait and station: The patient has a normal gait. Tandem gait is normal. Romberg is negative. No drift is seen.  Reflexes: Deep tendon reflexes are symmetric.   Assessment/Plan:  1. Migraine headache  The patient is doing quite well currently with her migraines, generally only having one a month. She will continue the Zonegran and Maxalt for now. She will followup in about 6 months.  Jill Alexanders MD 11/11/2013 8:28 PM  [facility] [address] [place], [place] [address]  Phone [phone] Fax [phone]

## 2013-11-13 ENCOUNTER — Other Ambulatory Visit: Payer: Self-pay

## 2013-11-13 MED ORDER — RIZATRIPTAN BENZOATE 10 MG PO TABS: 10.0000 mg | ORAL_TABLET | ORAL | Status: DC | PRN

## 2013-11-17 ENCOUNTER — Telehealth: Payer: Self-pay | Admitting: Neurology

## 2013-11-17 NOTE — Telephone Encounter (Signed)
Patient stated she received a call from [facility], requesting prior authorization for rizatriptan (MAXALT) 10 MG tablet reference # [id number].  Please call anytime and advise, may leave message if not available.  Thanks

## 2013-11-17 NOTE — Telephone Encounter (Signed)
I called [facility] at [phone].  Spoke with Clair Gulling the pharmacist.  He said no prior Josem Kaufmann is required, and actually, the Rx has already been shipped to the patient.  States the patient can disregard the call she received, as in was made in error.  I called the patient back.  She is aware.

## 2013-12-09 ENCOUNTER — Other Ambulatory Visit: Payer: Self-pay | Admitting: Neurology

## 2013-12-15 ENCOUNTER — Other Ambulatory Visit: Payer: Self-pay | Admitting: Neurology

## 2014-01-01 ENCOUNTER — Telehealth: Payer: Self-pay | Admitting: Neurology

## 2014-01-01 MED ORDER — ZONISAMIDE 100 MG PO CAPS: 100.0000 mg | ORAL_CAPSULE | Freq: Two times a day (BID) | ORAL | Status: DC

## 2014-01-01 MED ORDER — PREDNISONE 5 MG PO TABS: ORAL_TABLET | ORAL | Status: DC

## 2014-01-01 NOTE — Telephone Encounter (Signed)
Left message that I wanted a little more information about her headaches, but I would have doctor return her call and advise.

## 2014-01-01 NOTE — Telephone Encounter (Signed)
Patient calling to state that for the past 2 weeks, her migraines have been getting worse despite medication. Please return call to patient and advise.

## 2014-01-01 NOTE — Telephone Encounter (Signed)
I called patient. The patient was doing well with her headaches, but she has been under some stress recently. The patient has had an increase in her headaches since last week, now having daily headaches. She is taking Maxalt up to 3 times daily with modest benefit. I will place her on a prednisone Dosepak, and we'll increase the Zonegran taking 100 mg twice daily. She will call if the headaches continue.

## 2014-01-07 ENCOUNTER — Other Ambulatory Visit: Payer: Self-pay | Admitting: Neurology

## 2014-02-25 ENCOUNTER — Encounter: Payer: Self-pay | Admitting: Neurology

## 2014-02-26 ENCOUNTER — Other Ambulatory Visit (HOSPITAL_COMMUNITY)
Admission: RE | Admit: 2014-02-26 | Discharge: 2014-02-26 | Disposition: A | Discharge status: Home / Self Care | Payer: BC Managed Care – PPO | Source: Ambulatory Visit | Attending: Internal Medicine | Admitting: Internal Medicine

## 2014-02-26 ENCOUNTER — Other Ambulatory Visit: Payer: Self-pay | Admitting: Registered Nurse

## 2014-02-26 DIAGNOSIS — Z01419 Encounter for gynecological examination (general) (routine) without abnormal findings: Secondary | ICD-10-CM | POA: Insufficient documentation

## 2014-03-03 ENCOUNTER — Encounter: Payer: Self-pay | Admitting: Neurology

## 2014-03-03 LAB — CYTOLOGY - PAP

## 2014-05-14 ENCOUNTER — Encounter: Payer: Self-pay | Admitting: Adult Health

## 2014-05-14 ENCOUNTER — Ambulatory Visit (INDEPENDENT_AMBULATORY_CARE_PROVIDER_SITE_OTHER): Payer: BLUE CROSS/BLUE SHIELD | Admitting: Adult Health

## 2014-05-14 VITALS — BP 94/61 | HR 63 | Ht 73.0 in | Wt 172.0 lb

## 2014-05-14 DIAGNOSIS — G43009 Migraine without aura, not intractable, without status migrainosus: Secondary | ICD-10-CM

## 2014-05-14 NOTE — Progress Notes (Signed)
I have read the note, and I agree with the clinical assessment and plan.  WILLIS,CHARLES KEITH   

## 2014-05-14 NOTE — Patient Instructions (Signed)
Continue Zonegran and maxalt If your headache frequency increasing please let us know.

## 2014-05-14 NOTE — Progress Notes (Signed)
PATIENT: Katelyn Mcclain DOB: Apr 28, 1950  REASON FOR VISIT: follow up HISTORY FROM: patient  HISTORY OF PRESENT ILLNESS: Katelyn Mcclain is a 64 year old female with a history of migraine headaches. She returns today for follow-up. She is currently taking Zonegran and tolerating it well. She uses the Maxalt as needed. She states that her migraines have improved. She is currently having 1 headache a month. She states that her migraines are normally located in the frontal region, she does experience nausea, denies vomiting. She does have photophobia and phonophobia. She states that her mother-in- law recently passed. Her mom was recently placed in a nursing home. Her dad has dementia. All of this has increased her stress level. Patient  saw a physiatrist today and her depression/anixety medications are being adjusted.    HISTORY 11/11/13 (WILLIS): [name] is a 64 year old right-handed white female with a history of migraine headaches. When she was seen last, she was having only 13 headache free days a month. The patient indicates that now she is only getting 1 migraine a month. She has gained excellent improvement with Zonegran taking 100 mg at night and 50 mg in the morning. She takes Maxalt for the headache when it does occur with good improvement. The patient has had some issues with sleep, but the [name] has been very helpful in this regard. The patient is now trying to get on melatonin, and reduce the amount of Lunesta that she takes. Overall, she feels much better in regards to her headache issues. She indicates that bright lights, certain perfumes, and weather changes may bring on headaches.  REVIEW OF SYSTEMS: Out of a complete 14 system review of symptoms, the patient complains only of the following symptoms, and all other reviewed systems are negative. See HPI  ALLERGIES: Allergies  Allergen Reactions  . Morphine And Related Other (See Comments)    Sensitive to narcotics, worsening  migraines.  Dorma Russell [Tape]     HOME MEDICATIONS: Outpatient Prescriptions Prior to Visit  Medication Sig Dispense Refill  . calcium carbonate (OS-CAL) 600 MG TABS Take 600 mg by mouth daily.      . clotrimazole (MYCELEX) 10 MG troche Take 10 mg by mouth 5 (five) times daily.    . eszopiclone (LUNESTA) 1 MG TABS tablet Take 1 mg by mouth at bedtime as needed for sleep. Take immediately before bedtime    . fish oil-omega-3 fatty acids 1000 MG capsule Take 2 g by mouth daily.      . folic acid (FOLVITE) 1 MG tablet Take 1 mg by mouth daily.      . Melatonin 1 MG TABS Take 1 mg by mouth at bedtime as needed.    . Misc Natural Products (OSTEO BI-FLEX ADV JOINT SHIELD PO) Take 1 tablet by mouth daily.      . montelukast (SINGULAIR) 10 MG tablet Take 10 mg by mouth daily.    . pantoprazole (PROTONIX) 40 MG tablet Take 40 mg by mouth daily.    . predniSONE (DELTASONE) 5 MG tablet Begin taking 6 tablets daily, taper by one tablet daily until off the medication. 21 tablet 0  . promethazine (PHENERGAN) 25 MG tablet Take 25 mg by mouth daily as needed.    . rizatriptan (MAXALT) 10 MG tablet Take 1 tablet (10 mg total) by mouth as needed for migraine. May repeat in 2 hours if needed. Max 3 tabs daily 36 tablet 3  . zonisamide (ZONEGRAN) 100 MG capsule Take 1 capsule (  100 mg total) by mouth 2 (two) times daily. 180 capsule 1  . ALPRAZolam (XANAX) 0.25 MG tablet Take 0.25 mg by mouth daily as needed.    . baclofen (LIORESAL) 10 MG tablet Take 10 mg by mouth 3 (three) times daily as needed.     Marland Kitchen escitalopram (LEXAPRO) 20 MG tablet Take 20 mg by mouth daily.      Facility-Administered Medications Prior to Visit  Medication Dose Route Frequency Provider Last Rate Last Dose  . albuterol (PROVENTIL) (2.5 MG/3ML) 0.083% nebulizer solution 2.5 mg  2.5 mg Nebulization Once Ryan M Dunn, PA-C      . ipratropium (ATROVENT) nebulizer solution 0.5 mg  0.5 mg Nebulization Once Rise Mu, PA-C        PAST  MEDICAL HISTORY: Past Medical History  Diagnosis Date  . Celiac disease   . Gastroparesis   . Asthma   . Migraine   . Asthma     inhaler prn  . GERD (gastroesophageal reflux disease)   . IBS (irritable bowel syndrome)   . Allergic rhinitis   . Osteopenia   . Dysmenorrhea   . Hx of low back pain   . Migraine without aura, with intractable migraine, so stated, without mention of status migrainosus 05/20/2013  . Depression with anxiety     PAST SURGICAL HISTORY: Past Surgical History  Procedure Laterality Date  . Colonoscopy    . Abdominal surgery    . Mass excision  08/08/2011    Procedure: MINOR EXCISION OF MASS;  Surgeon: Cammie Sickle., MD;  Location: [facility];  Service: Orthopedics;  Laterality: Left;  left ring excision cyst proximal phalangeal joint  . Breast biopsy Right     x2  . Nissen fundoplication    . Foot surgery Left     Bunionectomy  . Tonsillectomy      FAMILY HISTORY: Family History  Problem Relation Age of Onset  . Stroke Father   . Dementia Father   . Atrial fibrillation Father   . Cancer Father   . Hypertension Mother   . Scoliosis Mother   . Heart disease Sister     Congenital heart disease  . Migraines Neg Hx        PHYSICAL EXAM  Filed Vitals:   05/14/14 1402  BP: 94/61  Pulse: 63  Height: 6\' 1"  (1.854 m)  Weight: 172 lb (78.019 kg)   Body mass index is 22.7 kg/(m^2).  Generalized: Well developed, in no acute distress   Neurological examination  Mentation: Alert oriented to time, place, history taking. Follows all commands speech and language fluent Cranial nerve II-XII: Pupils were equal round reactive to light. Extraocular movements were full, visual field were full on confrontational test. Facial sensation and strength were normal. Uvula tongue midline. Head turning and shoulder shrug  were normal and symmetric. Motor: The motor testing reveals 5 over 5 strength of all 4 extremities. Good symmetric motor  tone is noted throughout.  Sensory: Sensory testing is intact to soft touch on all 4 extremities. No evidence of extinction is noted.  Coordination: Cerebellar testing reveals good finger-nose-finger and heel-to-shin bilaterally.  Gait and station: Gait is normal. Tandem gait is normal. Romberg is negative. No drift is seen.  Reflexes: Deep tendon reflexes are symmetric and normal bilaterally.    DIAGNOSTIC DATA (LABS, IMAGING, TESTING) - I reviewed patient records, labs, notes, testing and imaging myself where available.  ASSESSMENT AND PLAN 64 y.o. year old female  has a past medical history of Celiac disease; Gastroparesis; Asthma; Migraine; Asthma; GERD (gastroesophageal reflux disease); IBS (irritable bowel syndrome); Allergic rhinitis; Osteopenia; Dysmenorrhea; low back pain; Migraine without aura, with intractable migraine, so stated, without mention of status migrainosus (05/20/2013); and Depression with anxiety. here with:  1. Migraine headaches.   Migraines have improved.  Continue Zonegran and Maxalt  If your headache frequency increases please let us know.  F/U in 6 months or sooner if needed.   Ward Givens, MSN, NP-C 05/14/2014, 2:14 PM [facility] [address] [place], [place] [address] [phone]  Note: This document was prepared with digital dictation and possible smart phrase technology. Any transcriptional errors that result from this process are unintentional.

## 2014-07-02 ENCOUNTER — Telehealth: Payer: Self-pay | Admitting: Adult Health

## 2014-07-02 ENCOUNTER — Encounter (HOSPITAL_COMMUNITY): Payer: Self-pay | Admitting: Emergency Medicine

## 2014-07-02 ENCOUNTER — Emergency Department (HOSPITAL_COMMUNITY)
Admission: EM | Admit: 2014-07-02 | Discharge: 2014-07-02 | Disposition: A | Discharge status: Home / Self Care | Payer: PRIVATE HEALTH INSURANCE | Attending: Emergency Medicine | Admitting: Emergency Medicine

## 2014-07-02 DIAGNOSIS — J45909 Unspecified asthma, uncomplicated: Secondary | ICD-10-CM | POA: Insufficient documentation

## 2014-07-02 DIAGNOSIS — Z79899 Other long term (current) drug therapy: Secondary | ICD-10-CM | POA: Diagnosis not present

## 2014-07-02 DIAGNOSIS — K219 Gastro-esophageal reflux disease without esophagitis: Secondary | ICD-10-CM | POA: Insufficient documentation

## 2014-07-02 DIAGNOSIS — Z87891 Personal history of nicotine dependence: Secondary | ICD-10-CM | POA: Diagnosis not present

## 2014-07-02 DIAGNOSIS — F32A Depression, unspecified: Secondary | ICD-10-CM

## 2014-07-02 DIAGNOSIS — F329 Major depressive disorder, single episode, unspecified: Secondary | ICD-10-CM | POA: Insufficient documentation

## 2014-07-02 DIAGNOSIS — G43909 Migraine, unspecified, not intractable, without status migrainosus: Secondary | ICD-10-CM | POA: Insufficient documentation

## 2014-07-02 DIAGNOSIS — G8929 Other chronic pain: Secondary | ICD-10-CM | POA: Diagnosis not present

## 2014-07-02 DIAGNOSIS — F419 Anxiety disorder, unspecified: Secondary | ICD-10-CM | POA: Diagnosis not present

## 2014-07-02 MED ORDER — ZOLPIDEM TARTRATE 5 MG PO TABS: 5.0000 mg | ORAL_TABLET | Freq: Every evening | ORAL | Status: DC | PRN

## 2014-07-02 MED ORDER — ONDANSETRON HCL 4 MG PO TABS: 4.0000 mg | ORAL_TABLET | Freq: Three times a day (TID) | ORAL | Status: DC | PRN

## 2014-07-02 MED ORDER — IBUPROFEN 200 MG PO TABS: 600.0000 mg | ORAL_TABLET | Freq: Three times a day (TID) | ORAL | Status: DC | PRN

## 2014-07-02 MED ORDER — NICOTINE 21 MG/24HR TD PT24: 21.0000 mg | MEDICATED_PATCH | Freq: Every day | TRANSDERMAL | Status: DC

## 2014-07-02 MED ORDER — ACETAMINOPHEN 325 MG PO TABS: 650.0000 mg | ORAL_TABLET | ORAL | Status: DC | PRN

## 2014-07-02 MED ORDER — ALUM & MAG HYDROXIDE-SIMETH 200-200-20 MG/5ML PO SUSP: 30.0000 mL | ORAL | Status: DC | PRN

## 2014-07-02 MED ORDER — LORAZEPAM 1 MG PO TABS: 1.0000 mg | ORAL_TABLET | Freq: Three times a day (TID) | ORAL | Status: DC | PRN

## 2014-07-02 NOTE — Telephone Encounter (Signed)
Called and spoke to patient. Patient and her husband are on there way to PCP Dr.Kim  Patient is being ing worked in today 07-02-14. Patient states she has been crying all day . Patient is going threw a lot with her family. [name] states thanks for Listing he will call back if patient needs a sooner apt for her migraines. Please Make Katelyn Mcclain aware.  Patient also went to the ER today.

## 2014-07-02 NOTE — Telephone Encounter (Signed)
[facility] suffering with severe anxiety and depression and ER physicians will not administer any medication.  Questioning if pt could be seen today by Jinny Blossom, NP.  Please call and advise.

## 2014-07-02 NOTE — ED Provider Notes (Signed)
CSN: [id number]     Arrival date & time 07/02/14  1123 History  This chart was scribed for non-physician practitioner Clayton Bibles working with Jola Schmidt, MD by Donato Schultz, ED Scribe. This patient was seen in room WTR4/WLPT4 and the patient's care was started at 12:51 PM.    Chief Complaint  Patient presents with  . Depression  . Anxiety    Patient is a 64 y.o. female presenting with anxiety. The history is provided by the patient. No language interpreter was used.  Anxiety Pertinent negatives include no chest pain, no abdominal pain and no shortness of breath.   HPI Comments: Katelyn Mcclain is a 64 y.o. female with a history of depression and anxiety who presents to the Emergency Department complaining of anxiety and depression that has been worsening over the past month.  Has not been eating much for several weeks.  She went to a restaurant this morning in an attempt to eat something but did not have an appetite.  She got in her car to go home and started crying.  She was unable to stop crying and drove to her psychologist's office who recommended she report to the ED based on her symptoms.  She has been crying since she woke up this morning and has not been able to stop.  Per the patient's husband, she has been dealing with financial stress and the loss of his mother, her parents have been put in a rest home and she manages their finances and visits them frequently.  Her last appointment with her psychologist was last week.  She is in the process of receiving new medication that has not been delivered yeet.  She lists sleep disturbance as an associated symptom.  She takes [name] but still has trouble falling asleep or staying asleep.  She denies fever, chills, SOB, urinary problems, abdominal pain, diarrhea, nausea, vomiting, SI, and HI as associated symptoms.  Her PCP is Dr. Jani Gravel and she also sees a psychiatrist and counselor.    Past Medical History  Diagnosis Date  . Celiac disease    . Gastroparesis   . Asthma   . Migraine   . Asthma     inhaler prn  . GERD (gastroesophageal reflux disease)   . IBS (irritable bowel syndrome)   . Allergic rhinitis   . Osteopenia   . Dysmenorrhea   . Hx of low back pain   . Migraine without aura, with intractable migraine, so stated, without mention of status migrainosus 05/20/2013  . Depression with anxiety    Past Surgical History  Procedure Laterality Date  . Colonoscopy    . Abdominal surgery    . Mass excision  08/08/2011    Procedure: MINOR EXCISION OF MASS;  Surgeon: Cammie Sickle., MD;  Location: [facility];  Service: Orthopedics;  Laterality: Left;  left ring excision cyst proximal phalangeal joint  . Breast biopsy Right     x2  . Nissen fundoplication    . Foot surgery Left     Bunionectomy  . Tonsillectomy     Family History  Problem Relation Age of Onset  . Stroke Father   . Dementia Father   . Atrial fibrillation Father   . Cancer Father   . Hypertension Mother   . Scoliosis Mother   . Heart disease Sister     Congenital heart disease  . Migraines Neg Hx    History  Substance Use Topics  . Smoking status: Former  Smoker  . Smokeless tobacco: Never Used  . Alcohol Use: Yes     Comment: wine occasionally   OB History    No data available     Review of Systems  Constitutional: Negative for fever and chills.  Respiratory: Negative for shortness of breath.   Cardiovascular: Negative for chest pain.  Gastrointestinal: Negative for nausea, vomiting, abdominal pain and diarrhea.  Endocrine: Negative for polyuria.  Genitourinary: Negative for dysuria, urgency, frequency, hematuria, decreased urine volume, enuresis, difficulty urinating and dyspareunia.  Allergic/Immunologic: Negative for immunocompromised state.  Psychiatric/Behavioral: Positive for sleep disturbance and dysphoric mood. Negative for suicidal ideas and self-injury. The patient is nervous/anxious.   All other systems  reviewed and are negative.    Allergies  Adhesive and Morphine and related  Home Medications   Prior to Admission medications   Medication Sig Start Date End Date Taking? Authorizing Provider  ALPRAZolam (XANAX) 0.25 MG tablet Take 0.25 mg by mouth daily as needed. 02/18/13   Historical Provider, MD  fish oil-omega-3 fatty acids 1000 MG capsule Take 2 g by mouth daily.      Historical Provider, MD  Misc Natural Products (OSTEO BI-FLEX ADV JOINT SHIELD PO) Take 1 tablet by mouth daily.      Historical Provider, MD  montelukast (SINGULAIR) 10 MG tablet Take 10 mg by mouth daily. 11/03/13   Historical Provider, MD  pantoprazole (PROTONIX) 40 MG tablet Take 40 mg by mouth daily. 06/09/13   Historical Provider, MD  predniSONE (DELTASONE) 5 MG tablet Begin taking 6 tablets daily, taper by one tablet daily until off the medication. 01/01/14   Kathrynn Ducking, MD  promethazine (PHENERGAN) 25 MG tablet Take 25 mg by mouth daily as needed. 03/14/13   Historical Provider, MD  rizatriptan (MAXALT) 10 MG tablet Take 1 tablet (10 mg total) by mouth as needed for migraine. May repeat in 2 hours if needed. Max 3 tabs daily 11/13/13   Kathrynn Ducking, MD  zonisamide (ZONEGRAN) 100 MG capsule Take 1 capsule (100 mg total) by mouth 2 (two) times daily. 01/01/14   Kathrynn Ducking, MD   Triage Vitals: BP 158/99 mmHg  Pulse 70  Temp(Src) 98.1 F (36.7 C) (Oral)  Resp 22  SpO2 98%  Physical Exam  Constitutional: She appears well-developed and well-nourished. No distress.  HENT:  Head: Normocephalic and atraumatic.  Neck: Neck supple.  Cardiovascular: Normal rate and regular rhythm.   Pulmonary/Chest: Effort normal and breath sounds normal. No respiratory distress. She has no wheezes. She has no rales.  Abdominal: Soft. She exhibits no distension. There is no tenderness. There is no rebound and no guarding.  Neurological: She is alert.  Skin: She is not diaphoretic.  Psychiatric: She is slowed. She exhibits  a depressed mood. She expresses no homicidal and no suicidal ideation.  Tearful  Nursing note and vitals reviewed.   ED Course  Procedures (including critical care time)  DIAGNOSTIC STUDIES: Oxygen Saturation is 98% on room air, normal by my interpretation.    COORDINATION OF CARE: 1:00 PM- Will admit the patient for further evaluation by psychologist.  The patient agreed to the treatment plan.   Labs Review Labs Reviewed - No data to display  Imaging Review No results found.   EKG Interpretation None      MDM   Final diagnoses:  Depression   Pt with hx anxiety depression with recent worsening, now not functioning well, not eating much, crying constantly.  She and husband feel she  is "having a breakdown."  Sent into ED by her psychologist.  Denies SI, HI.   TTS recommended discharge and she was discharged by attending physician.     I personally performed the services described in this documentation, which was scribed in my presence. The recorded information has been reviewed and is accurate.    Clayton Bibles, PA-C 07/02/14 [place], MD 07/02/14 [phone]

## 2014-07-02 NOTE — ED Notes (Signed)
Patient seen by [facility] and discharge recommended.  Patient ambulated without assistance and discharged home.

## 2014-07-02 NOTE — ED Notes (Signed)
Pt c/o anxiety and depression x 2 years, states that her psychiatrist has been adjusting her medications and trying to find the right medication. Pt states she recently placed her parents in a nursing home and her mother in law recently died. Pt tearful, unable to stop crying. Denies hallucinations, denies suicidal ideation, denies attempt to harm self in past month.

## 2014-07-02 NOTE — Telephone Encounter (Signed)
Noted. Patient can schedule a sooner appointment if needed for migraines. I did not call patient.

## 2014-07-02 NOTE — BH Assessment (Addendum)
Tele Assessment Note   Katelyn Mcclain is an 64 y.o. female. Pt arrived voluntarily with her husband to [facility]. Pt denies SI/HI. Pt denies delusions and hallucinations. Pt states that she has been diagnosed with MDD. Pt reports current stressors in her life. Pt states that her parents have recently been placed in nursing homes, she is in conflict with sister, she is having difficulty with adjusting to retirement, and has financial issues. Pt described the following depressive symptoms: tearfulness, loss of appetite, decreased sleep, increased irritabilty, and depressed mood most of the day. The Pt reports 2 previous SI attempts 1 at 64 yo and 1 in the 5s. Pt states that she was hospitalized in the 80s at [facility] ([facility] at that time) for SI attempt. Pt states that she has been seeing Dr. Peterson Lombard and Dr. Darleene Cleaver since January 2016. Pt states that she has been prescribed Bupropion. Pt reports current medication is not effective. Pt states that she is seeking an evaluation of her medication. Pt tearful throughout assessment.   Writer consulted with Dr.Taylor and Reginold Agent. Per Dr. Lovena Le and Reginold Agent Pt does not meet inpatient criteria. Pt D/C for follow-up appointment with Dr. Darleene Cleaver. Pt's appt. set for 07/09/14 at 2:30pm. Pt provided with appt.day and time.  Axis I: Major Depression, Recurrent severe Axis II: Deferred Axis III:  Past Medical History  Diagnosis Date  . Celiac disease   . Gastroparesis   . Asthma   . Migraine   . Asthma     inhaler prn  . GERD (gastroesophageal reflux disease)   . IBS (irritable bowel syndrome)   . Allergic rhinitis   . Osteopenia   . Dysmenorrhea   . Hx of low back pain   . Migraine without aura, with intractable migraine, so stated, without mention of status migrainosus 05/20/2013  . Depression with anxiety    Axis IV: economic problems, educational problems, other psychosocial or environmental problems, problems related to social environment and  problems with access to health care services Axis V: 31-40 impairment in reality testing  Past Medical History:  Past Medical History  Diagnosis Date  . Celiac disease   . Gastroparesis   . Asthma   . Migraine   . Asthma     inhaler prn  . GERD (gastroesophageal reflux disease)   . IBS (irritable bowel syndrome)   . Allergic rhinitis   . Osteopenia   . Dysmenorrhea   . Hx of low back pain   . Migraine without aura, with intractable migraine, so stated, without mention of status migrainosus 05/20/2013  . Depression with anxiety     Past Surgical History  Procedure Laterality Date  . Colonoscopy    . Abdominal surgery    . Mass excision  08/08/2011    Procedure: MINOR EXCISION OF MASS;  Surgeon: Cammie Sickle., MD;  Location: [place];  Service: Orthopedics;  Laterality: Left;  left ring excision cyst proximal phalangeal joint  . Breast biopsy Right     x2  . Nissen fundoplication    . Foot surgery Left     Bunionectomy  . Tonsillectomy      Family History:  Family History  Problem Relation Age of Onset  . Stroke Father   . Dementia Father   . Atrial fibrillation Father   . Cancer Father   . Hypertension Mother   . Scoliosis Mother   . Heart disease Sister     Congenital heart disease  . Migraines Neg  Hx     Social History:  reports that she has quit smoking. She has never used smokeless tobacco. She reports that she drinks alcohol. She reports that she does not use illicit drugs.  Additional Social History:  Alcohol / Drug Use Pain Medications: Pt denies Prescriptions: Bupropion Over the Counter: Pt denies History of alcohol / drug use?: No history of alcohol / drug abuse Longest period of sobriety (when/how long): NA  CIWA: CIWA-Ar BP: 158/99 mmHg Pulse Rate: 70 COWS:    PATIENT STRENGTHS: (choose at least two) Communication skills Supportive family/friends  Allergies:  Allergies  Allergen Reactions  . Adhesive [Tape] Other  (See Comments)    Tear's skin off   . Morphine And Related Other (See Comments)    Sensitive to narcotics, worsening migraines.    Home Medications:  (Not in a hospital admission)  OB/GYN Status:  No LMP recorded. Patient is postmenopausal.  General Assessment Data Location of Assessment: [facility] ED Is this a Tele or Face-to-Face Assessment?: Face-to-Face Is this an Initial Assessment or a Re-assessment for this encounter?: Initial Assessment Living Arrangements: Spouse/significant other Can pt return to current living arrangement?: Yes Admission Status: Voluntary Is patient capable of signing voluntary admission?: Yes Transfer from: Home Referral Source: Self/Family/Friend     [place] Living Arrangements: Spouse/significant other Name of Psychiatrist: Dr. Darleene Cleaver Name of Therapist: Dr. Peterson Lombard  Education Status Is patient currently in school?: No Current Grade: NA Highest grade of school patient has completed: [employer] Name of school: NA Contact person: Na  Risk to self with the past 6 months Suicidal Ideation: No Suicidal Intent: No Is patient at risk for suicide?: No Suicidal Plan?: No Access to Means: No What has been your use of drugs/alcohol within the last 12 months?: NA Previous Attempts/Gestures: Yes How many times?: 2 Other Self Harm Risks: NA Triggers for Past Attempts: None known Intentional Self Injurious Behavior: None Family Suicide History: Yes Recent stressful life event(s): Divorce, Financial Problems, Loss (Comment), Trauma (Comment) Persecutory voices/beliefs?: No Depression: Yes Depression Symptoms: Tearfulness, Fatigue, Loss of interest in usual pleasures, Feeling worthless/self pity, Feeling angry/irritable Substance abuse history and/or treatment for substance abuse?: No Suicide prevention information given to non-admitted patients: Not applicable  Risk to Others within the past 6 months Homicidal Ideation: No Thoughts of Harm  to Others: No Current Homicidal Intent: No Current Homicidal Plan: No Access to Homicidal Means: No Identified Victim: NA History of harm to others?: No Assessment of Violence: None Noted Violent Behavior Description: NA Does patient have access to weapons?: No Criminal Charges Pending?: No Does patient have a court date: No  Psychosis Hallucinations: None noted Delusions: None noted  Mental Status Report Appearance/Hygiene: Unremarkable Eye Contact: Good Motor Activity: Freedom of movement Speech: Logical/coherent Level of Consciousness: Alert Mood: Depressed, Sad Affect: Depressed, Sad Anxiety Level: Moderate Thought Processes: Coherent, Relevant Judgement: Unimpaired Orientation: Person, Place, Time, Situation, Appropriate for developmental age Obsessive Compulsive Thoughts/Behaviors: None  Cognitive Functioning Concentration: Normal Memory: Recent Intact, Remote Intact IQ: Average Insight: Good Impulse Control: Good Appetite: Poor Weight Loss: 0 Weight Gain: 0 Sleep: Decreased Total Hours of Sleep: 3 Vegetative Symptoms: None  ADLScreening [facility]) Patient's cognitive ability adequate to safely complete daily activities?: Yes Patient able to express need for assistance with ADLs?: Yes Independently performs ADLs?: Yes (appropriate for developmental age)  Prior Inpatient Therapy Prior Inpatient Therapy: Yes Prior Therapy Dates: In the 80s Prior Therapy Facilty/Provider(s): [facility] ([place] the time) Reason for  Treatment: SI attempt  Prior Outpatient Therapy Prior Outpatient Therapy: Yes Prior Therapy Dates: 2016 Prior Therapy Facilty/Provider(s): Dr. Celine Ahr and Dr. Darleene Cleaver Reason for Treatment: Depression  ADL Screening (condition at time of admission) Patient's cognitive ability adequate to safely complete daily activities?: Yes Is the patient deaf or have difficulty hearing?: No Does the patient have difficulty seeing, even when  wearing glasses/contacts?: No Does the patient have difficulty concentrating, remembering, or making decisions?: No Patient able to express need for assistance with ADLs?: Yes Does the patient have difficulty dressing or bathing?: No Independently performs ADLs?: Yes (appropriate for developmental age) Does the patient have difficulty walking or climbing stairs?: No       Abuse/Neglect Assessment (Assessment to be complete while patient is alone) Physical Abuse: Denies Verbal Abuse: Denies Sexual Abuse: Denies Exploitation of patient/patient's resources: Denies Self-Neglect: Denies     Regulatory affairs officer (For Healthcare) Does patient have an advance directive?: No Would patient like information on creating an advanced directive?: No - patient declined information    Additional Information 1:1 In Past 12 Months?: No CIRT Risk: No Elopement Risk: No Does patient have medical clearance?: Yes     Disposition:  Disposition Initial Assessment Completed for this Encounter: Yes Disposition of Patient: Other dispositions Other disposition(s): Other (Comment) (Pt D/C to current providers)  Lamoine Magallon D 07/02/2014 1:50 PM

## 2014-07-02 NOTE — ED Provider Notes (Signed)
Patient seen and evaluated by behavioral health.  Please see consultation for complete details.  They felt as though the patient was not a candidate for inpatient management and set up an outpatient psychiatric appointment.  Jola Schmidt, MD 07/02/14 [phone]

## 2014-08-17 ENCOUNTER — Other Ambulatory Visit: Payer: Self-pay | Admitting: Neurology

## 2014-08-17 NOTE — Telephone Encounter (Signed)
Current dose per note on 09/24

## 2014-11-18 ENCOUNTER — Encounter: Payer: Self-pay | Admitting: Adult Health

## 2014-11-18 ENCOUNTER — Ambulatory Visit (INDEPENDENT_AMBULATORY_CARE_PROVIDER_SITE_OTHER): Payer: BLUE CROSS/BLUE SHIELD | Admitting: Adult Health

## 2014-11-18 VITALS — BP 106/68 | HR 72 | Ht 72.0 in | Wt 156.0 lb

## 2014-11-18 DIAGNOSIS — G43009 Migraine without aura, not intractable, without status migrainosus: Secondary | ICD-10-CM | POA: Diagnosis not present

## 2014-11-18 NOTE — Progress Notes (Signed)
I have read the note, and I agree with the clinical assessment and plan.  Shonita Rinck KEITH   

## 2014-11-18 NOTE — Patient Instructions (Signed)
Continue Zonegran and Maxalt. If your symptoms worsen or you develop new symptoms please let us know.

## 2014-11-18 NOTE — Progress Notes (Signed)
PATIENT: Katelyn Mcclain DOB: [birth year]  REASON FOR VISIT: follow - migraine HISTORY FROM: patient  HISTORY OF PRESENT ILLNESS: Katelyn Mcclain is a 64 year old female with a history of migraine headaches. She returns today for follow-up. The patient continues to take Zonegran and tolerates it well. She reports that she was only having 1 headache a month. She states that she had to fly out to [place] state to help family. While there her headache frequency increased. She contributes her headaches to a change in location, environment and added stress. She just recently came back home on Saturday and is having that her headaches improved now that she is back in her normal routine. Her headaches continue to be located in the frontal region. Confirms photophobia and phonophobia. She occasionally will have nausea but denies vomiting. For acute headache she will use Maxalt with good benefit. She denies any new neurological symptoms. She returns today for follow-up.  HISTORY 05/14/14: Katelyn Mcclain is a 64 year old female with a history of migraine headaches. She returns today for follow-up. She is currently taking Zonegran and tolerating it well. She uses the Maxalt as needed. She states that her migraines have improved. She is currently having 1 headache a month. She states that her migraines are normally located in the frontal region, she does experience nausea, denies vomiting. She does have photophobia and phonophobia. She states that her mother-in- law recently passed. Her mom was recently placed in a nursing home. Her dad has dementia. All of this has increased her stress level. Patient saw a physiatrist today and her depression/anixety medications are being adjusted.   REVIEW OF SYSTEMS: Out of a complete 14 system review of symptoms, the patient complains only of the following symptoms, and all other reviewed systems are negative.  Fatigue, runny nose, food allergies  ALLERGIES: Allergies    Allergen Reactions  . Adhesive [Tape] Other (See Comments)    Tear's skin off   . Morphine And Related Other (See Comments)    Sensitive to narcotics, worsening migraines.    HOME MEDICATIONS: Outpatient Prescriptions Prior to Visit  Medication Sig Dispense Refill  . albuterol (PROVENTIL HFA;VENTOLIN HFA) 108 (90 BASE) MCG/ACT inhaler Inhale 1 puff into the lungs every 6 (six) hours as needed for wheezing or shortness of breath.    . ALPRAZolam (XANAX) 0.25 MG tablet Take 0.25 mg by mouth daily as needed for anxiety or sleep.     . calcium gluconate 500 MG tablet Take 2 tablets by mouth daily.    . Coenzyme Q10 (COQ10) 100 MG CAPS Take 1 tablet by mouth daily.    . fish oil-omega-3 fatty acids 1000 MG capsule Take 2 g by mouth daily.      . folic acid (FOLVITE) 161 MCG tablet Take 400 mcg by mouth daily.    . Misc Natural Products (OSTEO BI-FLEX ADV JOINT SHIELD PO) Take 2 tablets by mouth daily.     . montelukast (SINGULAIR) 10 MG tablet Take 10 mg by mouth at bedtime.     . Multiple Vitamins-Minerals (PRESERVISION AREDS 2) CAPS Take 1 capsule by mouth 2 (two) times daily.    . pantoprazole (PROTONIX) 40 MG tablet Take 40 mg by mouth daily.    . promethazine (PHENERGAN) 25 MG tablet Take 25 mg by mouth daily as needed for nausea or vomiting.     . rizatriptan (MAXALT) 10 MG tablet Take 1 tablet (10 mg total) by mouth as needed for migraine. May repeat in  2 hours if needed. Max 3 tabs daily 36 tablet 3  . Vortioxetine HBr (BRINTELLIX) 20 MG TABS Take 20 mg by mouth at bedtime.    Marland Kitchen zonisamide (ZONEGRAN) 100 MG capsule TAKE 1 CAPSULE TWICE A DAY 180 capsule 1  . Eszopiclone 3 MG TABS Take 3 mg by mouth at bedtime. Take immediately before bedtime    . predniSONE (DELTASONE) 5 MG tablet Begin taking 6 tablets daily, taper by one tablet daily until off the medication. (Patient not taking: Reported on 07/02/2014) 21 tablet 0   Facility-Administered Medications Prior to Visit  Medication Dose  Route Frequency Provider Last Rate Last Dose  . albuterol (PROVENTIL) (2.5 MG/3ML) 0.083% nebulizer solution 2.5 mg  2.5 mg Nebulization Once Ryan M Dunn, PA-C      . ipratropium (ATROVENT) nebulizer solution 0.5 mg  0.5 mg Nebulization Once Rise Mu, PA-C        PAST MEDICAL HISTORY: Past Medical History  Diagnosis Date  . Celiac disease   . Gastroparesis   . Asthma   . Migraine   . Asthma     inhaler prn  . GERD (gastroesophageal reflux disease)   . IBS (irritable bowel syndrome)   . Allergic rhinitis   . Osteopenia   . Dysmenorrhea   . Hx of low back pain   . Migraine without aura, with intractable migraine, so stated, without mention of status migrainosus 05/20/2013  . Depression with anxiety     PAST SURGICAL HISTORY: Past Surgical History  Procedure Laterality Date  . Colonoscopy    . Abdominal surgery    . Mass excision  08/08/2011    Procedure: MINOR EXCISION OF MASS;  Surgeon: Cammie Sickle., MD;  Location: [place];  Service: Orthopedics;  Laterality: Left;  left ring excision cyst proximal phalangeal joint  . Breast biopsy Right     x2  . Nissen fundoplication    . Foot surgery Left     Bunionectomy  . Tonsillectomy      FAMILY HISTORY: Family History  Problem Relation Age of Onset  . Stroke Father   . Dementia Father   . Atrial fibrillation Father   . Cancer Father   . Hypertension Mother   . Scoliosis Mother   . Heart disease Sister     Congenital heart disease  . Migraines Neg Hx     SOCIAL HISTORY: Social History   Social History  . Marital Status: Married    Spouse Name: N/A  . Number of Children: 0  . Years of Education: college   Occupational History  .      [place]   Social History Main Topics  . Smoking status: Former Research scientist (life sciences)  . Smokeless tobacco: Never Used  . Alcohol Use: Yes     Comment: wine occasionally  . Drug Use: No  . Sexual Activity: Yes    Birth Control/ Protection: Post-menopausal    Other Topics Concern  . Not on file   Social History Narrative      PHYSICAL EXAM  Filed Vitals:   11/18/14 1100  BP: 106/68  Pulse: 72  Height: 6' (1.829 m)  Weight: 156 lb (70.761 kg)   Body mass index is 21.15 kg/(m^2).  Generalized: Well developed, in no acute distress   Neurological examination  Mentation: Alert oriented to time, place, history taking. Follows all commands speech and language fluent Cranial nerve II-XII: Pupils were equal round reactive to light. Extraocular movements were  full, visual field were full on confrontational test. Facial sensation and strength were normal. Uvula tongue midline. Head turning and shoulder shrug  were normal and symmetric. Motor: The motor testing reveals 5 over 5 strength of all 4 extremities. Good symmetric motor tone is noted throughout.  Sensory: Sensory testing is intact to soft touch on all 4 extremities. No evidence of extinction is noted.  Coordination: Cerebellar testing reveals good finger-nose-finger and heel-to-shin bilaterally.  Gait and station: Gait is normal. Tandem gait is normal. Romberg is negative. No drift is seen.  Reflexes: Deep tendon reflexes are symmetric and normal bilaterally.   DIAGNOSTIC DATA (LABS, IMAGING, TESTING) - I reviewed patient records, labs, notes, testing and imaging myself where available.    ASSESSMENT AND PLAN 64 y.o. year old female  has a past medical history of Celiac disease; Gastroparesis; Asthma; Migraine; Asthma; GERD (gastroesophageal reflux disease); IBS (irritable bowel syndrome); Allergic rhinitis; Osteopenia; Dysmenorrhea; low back pain; Migraine without aura, with intractable migraine, so stated, without mention of status migrainosus (05/20/2013); and Depression with anxiety. here with:  1. Migraines  Overall the patient is doing well. She will continue on Zonegran. She'll continue using Maxalt for acute therapy. If her headache frequency increases she should let us  know. She will follow-up in 6 months or sooner if needed.   Ward Givens, MSN, NP-C 11/18/2014, 11:00 AM [facility] [address], [place], [place] [address] [phone]  Note: This document was prepared with digital dictation and possible smart phrase technology. Any transcriptional errors that result from this process are unintentional.

## 2014-11-28 ENCOUNTER — Other Ambulatory Visit: Payer: Self-pay | Admitting: Neurology

## 2014-12-15 ENCOUNTER — Other Ambulatory Visit: Payer: Self-pay | Admitting: Dermatology

## 2015-02-01 ENCOUNTER — Ambulatory Visit (INDEPENDENT_AMBULATORY_CARE_PROVIDER_SITE_OTHER): Payer: BLUE CROSS/BLUE SHIELD | Admitting: Adult Health

## 2015-02-01 ENCOUNTER — Encounter: Payer: Self-pay | Admitting: Adult Health

## 2015-02-01 VITALS — BP 107/68 | HR 62 | Ht 72.0 in | Wt 158.0 lb

## 2015-02-01 DIAGNOSIS — G43019 Migraine without aura, intractable, without status migrainosus: Secondary | ICD-10-CM

## 2015-02-01 NOTE — Progress Notes (Signed)
I have read the note, and I agree with the clinical assessment and plan.  WILLIS,CHARLES KEITH   

## 2015-02-01 NOTE — Progress Notes (Signed)
PATIENT: Katelyn Mcclain DOB: 1950-10-28  REASON FOR VISIT: follow up-migraine headaches HISTORY FROM: patient  HISTORY OF PRESENT ILLNESS: Katelyn Mcclain is a 64 year old female with a history of migraine headaches. She returns today with increased frequency of her migraines. She reports that she went to the mountains recently and ever since then her headaches have continued to increase in frequency. She states that while she was in the mountains she was in the bed the entire time due to migraine. She states that she continues to have a dull headache. She notices that movement triggers her headaches. Currently the patient has a headache that she rates a 3 out of 10 on the pain scale. She does have some mild nausea but no vomiting. Does report photophobia but no phonophobia. She states that she also gets some dizziness" with her headaches. She recently had a I exam that was normal. She is currently taken Zonegran 100 mg twice a day. She also has Maxalt but she has not used that recently. She returns today for an evaluation.  HISTORY 11/18/14:Katelyn Mcclain is a 64 year old female with a history of migraine headaches. She returns today for follow-up. The patient continues to take Zonegran and tolerates it well. She reports that she was only having 1 headache a month. She states that she had to fly out to [place] state to help family. While there her headache frequency increased. She contributes her headaches to a change in location, environment and added stress. She just recently came back home on Saturday and is having that her headaches improved now that she is back in her normal routine. Her headaches continue to be located in the frontal region. Confirms photophobia and phonophobia. She occasionally will have nausea but denies vomiting. For acute headache she will use Maxalt with good benefit. She denies any new neurological symptoms. She returns today for follow-up.  HISTORY 05/14/14: Katelyn Mcclain is a  64 year old female with a history of migraine headaches. She returns today for follow-up. She is currently taking Zonegran and tolerating it well. She uses the Maxalt as needed. She states that her migraines have improved. She is currently having 1 headache a month. She states that her migraines are normally located in the frontal region, she does experience nausea, denies vomiting. She does have photophobia and phonophobia. She states that her mother-in- law recently passed. Her mom was recently placed in a nursing home. Her dad has dementia. All of this has increased her stress level. Patient saw a physiatrist today and her depression/anixety medications are being adjusted.     REVIEW OF SYSTEMS: Out of a complete 14 system review of symptoms, the patient complains only of the following symptoms, and all other reviewed systems are negative.  See history of present illness  ALLERGIES: Allergies  Allergen Reactions  . Adhesive [Tape] Other (See Comments)    Tear's skin off   . Morphine And Related Other (See Comments)    Sensitive to narcotics, worsening migraines.    HOME MEDICATIONS: Outpatient Prescriptions Prior to Visit  Medication Sig Dispense Refill  . albuterol (PROVENTIL HFA;VENTOLIN HFA) 108 (90 BASE) MCG/ACT inhaler Inhale 1 puff into the lungs every 6 (six) hours as needed for wheezing or shortness of breath.    . calcium gluconate 500 MG tablet Take 2 tablets by mouth daily.    . Coenzyme Q10 (COQ10) 100 MG CAPS Take 1 tablet by mouth daily.    . fish oil-omega-3 fatty acids 1000 MG capsule Take  2 g by mouth daily.      . folic acid (FOLVITE) 295 MCG tablet Take 400 mcg by mouth daily.    . Misc Natural Products (OSTEO BI-FLEX ADV JOINT SHIELD PO) Take 2 tablets by mouth daily.     . montelukast (SINGULAIR) 10 MG tablet Take 10 mg by mouth at bedtime.     . Multiple Vitamins-Minerals (PRESERVISION AREDS 2) CAPS Take 1 capsule by mouth 2 (two) times daily.    . pantoprazole  (PROTONIX) 40 MG tablet Take 40 mg by mouth daily.    . promethazine (PHENERGAN) 25 MG tablet Take 25 mg by mouth daily as needed for nausea or vomiting.     . rizatriptan (MAXALT) 10 MG tablet TAKE 1 TABLET AS NEEDED FOR MIGRAINE. MAY REPEAT IN 2 HOURS IF NEEDED. MAXIMUM 3 TABLETS DAILY. 36 tablet 1  . Vortioxetine HBr (BRINTELLIX) 20 MG TABS Take 20 mg by mouth at bedtime.    Marland Kitchen zonisamide (ZONEGRAN) 100 MG capsule TAKE 1 CAPSULE TWICE A DAY 180 capsule 1  . ALPRAZolam (XANAX) 0.25 MG tablet Take 0.25 mg by mouth daily as needed for anxiety or sleep.      Facility-Administered Medications Prior to Visit  Medication Dose Route Frequency Provider Last Rate Last Dose  . albuterol (PROVENTIL) (2.5 MG/3ML) 0.083% nebulizer solution 2.5 mg  2.5 mg Nebulization Once Ryan M Dunn, PA-C      . ipratropium (ATROVENT) nebulizer solution 0.5 mg  0.5 mg Nebulization Once Rise Mu, PA-C        PAST MEDICAL HISTORY: Past Medical History  Diagnosis Date  . Celiac disease   . Gastroparesis   . Asthma   . Migraine   . Asthma     inhaler prn  . GERD (gastroesophageal reflux disease)   . IBS (irritable bowel syndrome)   . Allergic rhinitis   . Osteopenia   . Dysmenorrhea   . Hx of low back pain   . Migraine without aura, with intractable migraine, so stated, without mention of status migrainosus 05/20/2013  . Depression with anxiety     PAST SURGICAL HISTORY: Past Surgical History  Procedure Laterality Date  . Colonoscopy    . Abdominal surgery    . Mass excision  08/08/2011    Procedure: MINOR EXCISION OF MASS;  Surgeon: Cammie Sickle., MD;  Location: [facility];  Service: Orthopedics;  Laterality: Left;  left ring excision cyst proximal phalangeal joint  . Breast biopsy Right     x2  . Nissen fundoplication    . Foot surgery Left     Bunionectomy  . Tonsillectomy      FAMILY HISTORY: Family History  Problem Relation Age of Onset  . Stroke Father   . Dementia  Father   . Atrial fibrillation Father   . Cancer Father   . Hypertension Mother   . Scoliosis Mother   . Heart disease Sister     Congenital heart disease  . Migraines Neg Hx     SOCIAL HISTORY: Social History   Social History  . Marital Status: Married    Spouse Name: N/A  . Number of Children: 0  . Years of Education: college   Occupational History  .      [employer]   Social History Main Topics  . Smoking status: Former Research scientist (life sciences)  . Smokeless tobacco: Never Used     Comment: IN college  . Alcohol Use: 0.0 oz/week  0 Standard drinks or equivalent per week     Comment: wine occasionally  . Drug Use: No  . Sexual Activity: Yes    Birth Control/ Protection: Post-menopausal   Other Topics Concern  . Not on file   Social History Narrative   Patient lives at home with her husband Hoy Morn).   Patient is retired.   Education college.   Right handed.    Caffeine one cup daily.      PHYSICAL EXAM  Filed Vitals:   02/01/15 1419  BP: 107/68  Pulse: 62  Height: 6' (1.829 m)  Weight: 158 lb (71.668 kg)   Body mass index is 21.42 kg/(m^2).  Generalized: Well developed, in no acute distress   Neurological examination  Mentation: Alert oriented to time, place, history taking. Follows all commands speech and language fluent Cranial nerve II-XII: Pupils were equal round reactive to light. Extraocular movements were full, visual field were full on confrontational test. Facial sensation and strength were normal. Uvula tongue midline. Head turning and shoulder shrug  were normal and symmetric. Motor: The motor testing reveals 5 over 5 strength of all 4 extremities. Good symmetric motor tone is noted throughout.  Sensory: Sensory testing is intact to soft touch on all 4 extremities. No evidence of extinction is noted.  Coordination: Cerebellar testing reveals good finger-nose-finger and heel-to-shin bilaterally.  Gait and station: Gait is normal. Tandem gait is normal.  Romberg is negative. No drift is seen.  Reflexes: Deep tendon reflexes are symmetric and normal bilaterally.   DIAGNOSTIC DATA (LABS, IMAGING, TESTING) - I reviewed patient records, labs, notes, testing and imaging myself where available.     ASSESSMENT AND PLAN 64 y.o. year old female  has a past medical history of Celiac disease; Gastroparesis; Asthma; Migraine; Asthma; GERD (gastroesophageal reflux disease); IBS (irritable bowel syndrome); Allergic rhinitis; Osteopenia; Dysmenorrhea; low back pain; Migraine without aura, with intractable migraine, so stated, without mention of status migrainosus (05/20/2013); and Depression with anxiety. here with:  1. Migraine headaches  The patient's headache frequency has increased over the last month. I will increase her Zonegran. The patient has some 25 mg tablets of Zonegran at home. She will begin taking 100 mg in the morning and 125 mg in the evening for 1 week then increase to 100 mg in the morning at 150 mg in the evening. She will let me know if this is beneficial. I advised the patient that if she has a severe headache that is not relieved by Maxalt that continues for greater than 24 hours we can try Depakote infusion or prednisone Dosepak. Patient verbalized understanding. She will keep her follow-up appointment in February.  Ward Givens, MSN, NP-C 02/01/2015, 2:36 PM [facility] [address] [place], [place] [address] [phone]

## 2015-02-01 NOTE — Patient Instructions (Signed)
Increase Zonegran- 100 mg in the AM and 125 mg in the PM for 1 week, then increase to 100 mg in the AM and 150 mg PM.  Use the 25 mg tablets in addition to the 100 mg tablet.  If your symptoms worsen or you develop new symptoms please let us know.

## 2015-02-12 ENCOUNTER — Telehealth: Payer: Self-pay | Admitting: Adult Health

## 2015-02-12 NOTE — Telephone Encounter (Signed)
Patient called, was advised by Jinny Blossom to call if she increased and how she increased zonisamide (ZONEGRAN) 100 MG capsule, patient has increased to 151m at night and 1083min morning, states she is doing pretty good and hopes it stays this way.

## 2015-02-15 NOTE — Telephone Encounter (Signed)
Noted  

## 2015-02-17 ENCOUNTER — Other Ambulatory Visit: Payer: Self-pay | Admitting: Neurology

## 2015-02-23 ENCOUNTER — Telehealth: Payer: Self-pay | Admitting: Adult Health

## 2015-02-23 MED ORDER — PREDNISONE 5 MG PO TABS: ORAL_TABLET | ORAL | Status: DC

## 2015-02-23 NOTE — Telephone Encounter (Signed)
Patient called requesting to leave message for [facility] regarding headaches increasing. Wants to know if Jinny Blossom would be willing to do a Prednisone ER pack and send to [facility] [place]. Patient will be leaving for the beach tomorrow, would like to get this before she leaves.

## 2015-02-23 NOTE — Telephone Encounter (Signed)
I called the patient. She is continues to have a headache. She states that she always has a dull headache some days the headache becomes more severe than others. She states that her headache is located in the frontal region. Radiating to the temples. She does have nausea but denies vomiting. She also has photophobia and phonophobia. She reports that she did consult with her OB/GYN about hair loss that could be associated with hormonal changes. She has set up an appointment with another physician in January to have this evaluated. For now I will give the  patient a prednisone Dosepak. The patient has taken prednisone before and has tolerated it well. She is aware of the side effects however I did review them with her again. Patient verbalized understanding. If this does not improve her headache she will give me a call.

## 2015-02-24 ENCOUNTER — Other Ambulatory Visit: Payer: Self-pay

## 2015-02-24 MED ORDER — PREDNISONE 5 MG PO TABS: ORAL_TABLET | ORAL | Status: DC

## 2015-02-24 NOTE — Telephone Encounter (Signed)
It appears the Rx was sent to [facility].  [facility] per patient request.  Receipt confirmed by pharmacy.  I called the patient back to advise.  Got no answer.  Left message.

## 2015-02-24 NOTE — Telephone Encounter (Signed)
Pt called sts she requested predniSONE (DELTASONE) 5 MG tablet to be [facility] [place]. Please call and advise at [phone]

## 2015-04-21 ENCOUNTER — Telehealth: Payer: Self-pay | Admitting: Adult Health

## 2015-04-21 NOTE — Telephone Encounter (Signed)
I called the patient back to clarify.  Got no answer.  Left message.   

## 2015-04-21 NOTE — Telephone Encounter (Signed)
Pt needs a refill on zonisamide (ZONEGRAN) 100 MG capsule. She would like it in 25 mg NOT 100 mg. [facility] Thank you

## 2015-04-22 MED ORDER — ZONISAMIDE 25 MG PO CAPS: 25.0000 mg | ORAL_CAPSULE | Freq: Every evening | ORAL | Status: DC

## 2015-04-22 NOTE — Telephone Encounter (Signed)
Thank you for the clarification.  Rx has been sent.  Receipt confirmed by pharmacy.

## 2015-04-22 NOTE — Telephone Encounter (Signed)
Pt called back and says she is taking a total of 225 mg of zonisamide. She has plenty of 100 mg. She says she had an old rx of 25 mg Zonisamide and started taking it as well around Sept. She says that she spoke with [place] about do it. Over all the pt would like a new Rx for only 65m of Zonisamide. Hope that is explained better. May call pt at [phone]

## 2015-05-12 HISTORY — PX: ROTATOR CUFF REPAIR: SHX139

## 2015-05-19 ENCOUNTER — Ambulatory Visit (INDEPENDENT_AMBULATORY_CARE_PROVIDER_SITE_OTHER): Payer: BLUE CROSS/BLUE SHIELD | Admitting: Adult Health

## 2015-05-19 ENCOUNTER — Encounter: Payer: Self-pay | Admitting: Adult Health

## 2015-05-19 VITALS — BP 102/68 | HR 80 | Resp 20 | Ht 69.5 in | Wt 163.0 lb

## 2015-05-19 DIAGNOSIS — G43009 Migraine without aura, not intractable, without status migrainosus: Secondary | ICD-10-CM

## 2015-05-19 NOTE — Patient Instructions (Signed)
Continue Zonegran If headaches worsen let us know If your symptoms worsen or you develop new symptoms please let us know.

## 2015-05-19 NOTE — Progress Notes (Signed)
I have read the note, and I agree with the clinical assessment and plan.  Kimya Mccahill KEITH   

## 2015-05-19 NOTE — Progress Notes (Signed)
PATIENT: Katelyn Mcclain DOB: [birth year]  REASON FOR VISIT: follow up- migraine headaches HISTORY FROM: patient  HISTORY OF PRESENT ILLNESS: Katelyn Mcclain is a 65 year old female with a history of migraine headaches. She returns today for follow-up. She is currently taken Zonegran 100 mg in the morning and 125 mg in the evening. She states that she is tolerating this well. She states that she can't remember when she had a headache last. She states that the increase in medication has been working very well for her. She states that she injured her right shoulder and was placed on hydrocodone. She is going to have surgery on that shoulder. She states that hydrocodone usually causes her to have rebound headaches. She states the last 2 days she's noticed a "slight twinge" and therefore she backed off the hydrocodone and just use the muscle relaxers. Other than her shoulder patient states that she is doing very well in the migraines have been under good control. She returns today for an evaluation.  HISTORY 02/01/15:  Katelyn Mcclain is a 65 year old female with a history of migraine headaches. She returns today with increased frequency of her migraines. She reports that she went to the mountains recently and ever since then her headaches have continued to increase in frequency. She states that while she was in the mountains she was in the bed the entire time due to migraine. She states that she continues to have a dull headache. She notices that movement triggers her headaches. Currently the patient has a headache that she rates a 3 out of 10 on the pain scale. She does have some mild nausea but no vomiting. Does report photophobia but no phonophobia. She states that she also gets some dizziness" with her headaches. She recently had a I exam that was normal. She is  lkcurrently taken Zonegran 100 mg twice a day. She also has Maxalt but she has not used that recently. She returns today for an  evaluation.  HISTORY 11/18/14:Katelyn Mcclain is a 65 year old female with a history of migraine headaches. She returns today for follow-up. The patient continues to take Zonegran and tolerates it well. She reports that she was only having 1 headache a month. She states that she had to fly out to [place] state to help family. While there her headache frequency increased. She contributes her headaches to a change in location, environment and added stress. She just recently came back home on Saturday and is having that her headaches improved now that she is back in her normal routine. Her headaches continue to be located in the frontal region. Confirms photophobia and phonophobia. She occasionally will have nausea but denies vomiting. For acute headache she will use Maxalt with good benefit. She denies any new neurological symptoms. She returns today for follow-up.  REVIEW OF SYSTEMS: Out of a complete 14 system review of symptoms, the patient complains only of the following symptoms, and all other reviewed systems are negative.  Nausea, joint pain, joint swelling, back pain, muscle cramps, fatigue, food allergies  ALLERGIES: Allergies  Allergen Reactions  . Adhesive [Tape] Other (See Comments)    Tear's skin off   . Morphine And Related Other (See Comments)    Sensitive to narcotics, worsening migraines.    HOME MEDICATIONS: Outpatient Prescriptions Prior to Visit  Medication Sig Dispense Refill  . albuterol (PROVENTIL HFA;VENTOLIN HFA) 108 (90 BASE) MCG/ACT inhaler Inhale 1 puff into the lungs every 6 (six) hours as needed for wheezing or shortness of breath.    Katelyn Mcclain  calcium gluconate 500 MG tablet Take 2 tablets by mouth daily.    . Coenzyme Q10 (COQ10) 100 MG CAPS Take 1 tablet by mouth daily.    . Eszopiclone (ESZOPICLONE) 3 MG TABS Take 3 mg by mouth at bedtime. Take immediately before bedtime    . fish oil-omega-3 fatty acids 1000 MG capsule Take 2 g by mouth daily.      . folic acid  (FOLVITE) A999333 MCG tablet Take 400 mcg by mouth daily.    . Glucosamine-Chondroitin (OSTEO BI-FLEX REGULAR STRENGTH PO) Take 2 tablets by mouth daily.    . Misc Natural Products (OSTEO BI-FLEX ADV JOINT SHIELD PO) Take 2 tablets by mouth daily.     . montelukast (SINGULAIR) 10 MG tablet Take 10 mg by mouth at bedtime.     . Multiple Vitamins-Minerals (PRESERVISION AREDS 2) CAPS Take 1 capsule by mouth 2 (two) times daily.    . pantoprazole (PROTONIX) 40 MG tablet Take 40 mg by mouth daily.    . Probiotic Product (PROBIOTIC DAILY PO) Take 1 tablet by mouth daily.    . promethazine (PHENERGAN) 25 MG tablet Take 25 mg by mouth daily as needed for nausea or vomiting.     . rizatriptan (MAXALT) 10 MG tablet TAKE 1 TABLET AS NEEDED FOR MIGRAINE. MAY REPEAT IN 2 HOURS IF NEEDED. MAXIMUM 3 TABLETS DAILY. 36 tablet 1  . Vortioxetine HBr (BRINTELLIX) 20 MG TABS Take 20 mg by mouth at bedtime.    Katelyn Mcclain zonisamide (ZONEGRAN) 100 MG capsule TAKE 1 CAPSULE TWICE A DAY 180 capsule 1  . zonisamide (ZONEGRAN) 25 MG capsule Take 1 capsule (25 mg total) by mouth every evening. With 100mg  capsule to make 125mg  nightly dose 90 capsule 0  . predniSONE (DELTASONE) 5 MG tablet Begin taking 6 tablets daily, taper by one tablet daily until off the medication. 21 tablet 0   Facility-Administered Medications Prior to Visit  Medication Dose Route Frequency Provider Last Rate Last Dose  . albuterol (PROVENTIL) (2.5 MG/3ML) 0.083% nebulizer solution 2.5 mg  2.5 mg Nebulization Once Ryan M Dunn, PA-C      . ipratropium (ATROVENT) nebulizer solution 0.5 mg  0.5 mg Nebulization Once Rise Mu, PA-C        PAST MEDICAL HISTORY: Past Medical History  Diagnosis Date  . Celiac disease   . Gastroparesis   . Asthma   . Migraine   . Asthma     inhaler prn  . GERD (gastroesophageal reflux disease)   . IBS (irritable bowel syndrome)   . Allergic rhinitis   . Osteopenia   . Dysmenorrhea   . Hx of low back pain   . Migraine  without aura, with intractable migraine, so stated, without mention of status migrainosus 05/20/2013  . Depression with anxiety     PAST SURGICAL HISTORY: Past Surgical History  Procedure Laterality Date  . Colonoscopy    . Abdominal surgery    . Mass excision  08/08/2011    Procedure: MINOR EXCISION OF MASS;  Surgeon: Cammie Sickle., MD;  Location: [place];  Service: Orthopedics;  Laterality: Left;  left ring excision cyst proximal phalangeal joint  . Breast biopsy Right     x2  . Nissen fundoplication    . Foot surgery Left     Bunionectomy  . Tonsillectomy      FAMILY HISTORY: Family History  Problem Relation Age of Onset  . Stroke Father   . Dementia Father   .  Atrial fibrillation Father   . Cancer Father   . Hypertension Mother   . Scoliosis Mother   . Heart disease Sister     Congenital heart disease  . Migraines Neg Hx     SOCIAL HISTORY: Social History   Social History  . Marital Status: Married    Spouse Name: N/A  . Number of Children: 0  . Years of Education: college   Occupational History  .      [employer]   Social History Main Topics  . Smoking status: Former Research scientist (life sciences)  . Smokeless tobacco: Never Used     Comment: IN college  . Alcohol Use: 0.0 oz/week    0 Standard drinks or equivalent per week     Comment: wine occasionally  . Drug Use: No  . Sexual Activity: Yes    Birth Control/ Protection: Post-menopausal   Other Topics Concern  . Not on file   Social History Narrative   Patient lives at home with her husband Hoy Morn).   Patient is retired.   Education college.   Right handed.    Caffeine one cup daily.      PHYSICAL EXAM  Filed Vitals:   05/19/15 1114  BP: 102/68  Pulse: 80  Resp: 20  Height: 5' 9.5" (1.765 m)  Weight: 163 lb (73.936 kg)   Body mass index is 23.73 kg/(m^2).  Generalized: Well developed, in no acute distress   Neurological examination  Mentation: Alert oriented to time,  place, history taking. Follows all commands speech and language fluent Cranial nerve II-XII: Pupils were equal round reactive to light. Extraocular movements were full, visual field were full on confrontational test. Facial sensation and strength were normal. Uvula tongue midline. Head turning and shoulder shrug  were normal and symmetric. Motor: The motor testing reveals 5 over 5 strength of all 4 extremities. Good symmetric motor tone is noted throughout. Limited range of motion with the right shoulder. Sensory: Sensory testing is intact to soft touch on all 4 extremities. No evidence of extinction is noted.  Coordination: Cerebellar testing reveals good finger-nose-finger and heel-to-shin bilaterally.  Gait and station: Gait is normal. Tandem gait is normal. Romberg is negative. No drift is seen.  Reflexes: Deep tendon reflexes are symmetric and normal bilaterally.   DIAGNOSTIC DATA (LABS, IMAGING, TESTING) - I reviewed patient records, labs, notes, testing and imaging myself where available.    ASSESSMENT AND PLAN 65 y.o. year old female  has a past medical history of Celiac disease; Gastroparesis; Asthma; Migraine; Asthma; GERD (gastroesophageal reflux disease); IBS (irritable bowel syndrome); Allergic rhinitis; Osteopenia; Dysmenorrhea; low back pain; Migraine without aura, with intractable migraine, so stated, without mention of status migrainosus (05/20/2013); and Depression with anxiety. here with:  1. Migraine headache  Overall the patient is doing well. She will continue on Zonegran 100 mg in the morning and 125 mg in the evening. Patient advised that if her headache frequency or severity increases she should let us know. She will follow-up in 6 months or sooner if needed.     Ward Givens, MSN, NP-C 05/19/2015, 11:26 AM [facility] [address], [place], [place] [address] [phone]

## 2015-07-18 ENCOUNTER — Other Ambulatory Visit: Payer: Self-pay | Admitting: Adult Health

## 2015-08-24 ENCOUNTER — Other Ambulatory Visit: Payer: Self-pay | Admitting: Neurology

## 2015-11-15 ENCOUNTER — Encounter: Payer: Self-pay | Admitting: Neurology

## 2015-11-15 ENCOUNTER — Ambulatory Visit (INDEPENDENT_AMBULATORY_CARE_PROVIDER_SITE_OTHER): Payer: BLUE CROSS/BLUE SHIELD | Admitting: Neurology

## 2015-11-15 VITALS — BP 108/73 | HR 62 | Ht 69.5 in | Wt 158.5 lb

## 2015-11-15 DIAGNOSIS — G43019 Migraine without aura, intractable, without status migrainosus: Secondary | ICD-10-CM | POA: Diagnosis not present

## 2015-11-15 MED ORDER — PREDNISONE 5 MG PO TABS: ORAL_TABLET | ORAL | 0 refills | Status: DC

## 2015-11-15 MED ORDER — ZONISAMIDE 25 MG PO CAPS: 50.0000 mg | ORAL_CAPSULE | Freq: Every day | ORAL | 1 refills | Status: DC

## 2015-11-15 MED ORDER — TRAZODONE HCL 50 MG PO TABS: 50.0000 mg | ORAL_TABLET | Freq: Every day | ORAL | 1 refills | Status: DC

## 2015-11-15 NOTE — Progress Notes (Signed)
Reason for visit: Migraine headache  Katelyn Mcclain is an 65 y.o. female  History of present illness:  Katelyn Mcclain is a 65 year old right-handed white female with a history of migraine headache. The patient has recently been under stress with illness of her father and the fact that her sister has come to visit. The patient has had a headache that has been going on daily for the last week. In the past, she has had allergy associated headaches. The patient is having a lot of photophobia and phonophobia with her current headache. She denies nausea and vomiting. The patient indicates that the Maxalt was not beneficial for these headaches. She is not sleeping well with the stress. She takes Lunesta at night but she is only getting 1 or 2 hours of sleep. She returns to this office for an evaluation. In the past, prednisone has been helpful to suppress her headache.   Past Medical History:  Diagnosis Date  . Allergic rhinitis   . Asthma   . Asthma    inhaler prn  . Celiac disease   . Depression with anxiety   . Dysmenorrhea   . Gastroparesis   . GERD (gastroesophageal reflux disease)   . Hx of low back pain   . IBS (irritable bowel syndrome)   . Migraine   . Migraine without aura, with intractable migraine, so stated, without mention of status migrainosus 05/20/2013  . Osteopenia     Past Surgical History:  Procedure Laterality Date  . ABDOMINAL SURGERY    . BREAST BIOPSY Right    x2  . COLONOSCOPY    . FOOT SURGERY Left    Bunionectomy  . MASS EXCISION  08/08/2011   Procedure: MINOR EXCISION OF MASS;  Surgeon: Cammie Sickle., MD;  Location: [place];  Service: Orthopedics;  Laterality: Left;  left ring excision cyst proximal phalangeal joint  . NISSEN FUNDOPLICATION    . ROTATOR CUFF REPAIR Right 05/2015  . TONSILLECTOMY      Family History  Problem Relation Age of Onset  . Stroke Father   . Dementia Father   . Atrial fibrillation Father   . Cancer  Father   . Hypertension Mother   . Scoliosis Mother   . Heart disease Sister     Congenital heart disease  . Migraines Neg Hx     Social history:  reports that she has quit smoking. She has never used smokeless tobacco. She reports that she drinks alcohol. She reports that she does not use drugs.    Allergies  Allergen Reactions  . Adhesive [Tape] Other (See Comments)    Tear's skin off   . Morphine And Related Other (See Comments)    Sensitive to narcotics, worsening migraines.    Medications:  Prior to Admission medications   Medication Sig Start Date End Date Taking? Authorizing Provider  albuterol (PROVENTIL HFA;VENTOLIN HFA) 108 (90 BASE) MCG/ACT inhaler Inhale 1 puff into the lungs every 6 (six) hours as needed for wheezing or shortness of breath.   Yes Historical Provider, MD  Calcium Carb-Cholecalciferol 600-100 MG-UNIT CAPS Take by mouth.   Yes Historical Provider, MD  Coenzyme Q10 (COQ10) 100 MG CAPS Take 1 tablet by mouth daily.   Yes Historical Provider, MD  DEXILANT 60 MG capsule  10/06/15  Yes Historical Provider, MD  Eszopiclone (ESZOPICLONE) 3 MG TABS Take 3 mg by mouth at bedtime. Take immediately before bedtime   Yes Historical Provider, MD  fish oil-omega-3  fatty acids 1000 MG capsule Take 2 g by mouth daily.     Yes Historical Provider, MD  folic acid (FOLVITE) 093 MCG tablet Take 800 mcg by mouth daily.    Yes Historical Provider, MD  Garlic (GARLIQUE PO) Take by mouth daily.   Yes Historical Provider, MD  Glucosamine-Chondroitin (OSTEO BI-FLEX REGULAR STRENGTH PO) Take 2 tablets by mouth daily.   Yes Historical Provider, MD  magnesium 30 MG tablet Take 30 mg by mouth daily.   Yes Historical Provider, MD  Misc Natural Products (OSTEO BI-FLEX ADV JOINT SHIELD PO) Take 2 tablets by mouth daily.    Yes Historical Provider, MD  montelukast (SINGULAIR) 10 MG tablet Take 10 mg by mouth at bedtime.  11/03/13  Yes Historical Provider, MD  Multiple Vitamins-Minerals  (PRESERVISION AREDS 2) CAPS Take 1 capsule by mouth 2 (two) times daily.   Yes Historical Provider, MD  Probiotic Product (PROBIOTIC DAILY PO) Take 1 tablet by mouth daily.   Yes Historical Provider, MD  promethazine (PHENERGAN) 25 MG tablet Take 25 mg by mouth daily as needed for nausea or vomiting.  03/14/13  Yes Historical Provider, MD  rizatriptan (MAXALT) 10 MG tablet TAKE 1 TABLET AS NEEDED FOR MIGRAINE. MAY REPEAT IN 2 HOURS IF NEEDED. MAXIMUM 3 TABLETS DAILY. 11/29/14  Yes Kathrynn Ducking, MD  Vortioxetine HBr (BRINTELLIX) 20 MG TABS Take 20 mg by mouth at bedtime.   Yes Historical Provider, MD  zonisamide (ZONEGRAN) 100 MG capsule TAKE 1 CAPSULE TWICE A DAY 08/24/15  Yes Kathrynn Ducking, MD  zonisamide (ZONEGRAN) 25 MG capsule TAKE 1 CAPSULE EVERY EVENING WITH 100 MG CAPSULE TO MAKE 125 MG NIGHTLY DOSE. 07/19/15  Yes Ward Givens, NP    ROS:  Out of a complete 14 system review of symptoms, the patient complains only of the following symptoms, and all other reviewed systems are negative.  Insomnia Headache Decreased concentration, anxiety Itching  Blood pressure 108/73, pulse 62, height 5' 9.5" (1.765 m), weight 158 lb 8 oz (71.9 kg).  Physical Exam  General: The patient is alert and cooperative at the time of the examination.  Skin: No significant peripheral edema is noted.   Neurologic Exam  Mental status: The patient is alert and oriented x 3 at the time of the examination. The patient has apparent normal recent and remote memory, with an apparently normal attention span and concentration ability.   Cranial nerves: Facial symmetry is present. Speech is normal, no aphasia or dysarthria is noted. Extraocular movements are full. Visual fields are full.  Motor: The patient has good strength in all 4 extremities.  Sensory examination: Soft touch sensation is symmetric on the face, arms, and legs.  Coordination: The patient has good finger-nose-finger and heel-to-shin  bilaterally.  Gait and station: The patient has a normal gait. Tandem gait is normal. Romberg is negative. No drift is seen.  Reflexes: Deep tendon reflexes are symmetric.   Assessment/Plan:  1. Intractable migraine  2. Insomnia  The patient will go up on the Zonegran taking 100 mg the morning, 150 mg in the evening. Trazodone will be added at night for sleep, 50 mg. The patient will be given a prednisone Dosepak, 5 mg 6 day pack. She will follow-up in 3 months, sooner if needed. If the headaches do not abate, we may bring her back in for a Depacon injection. Hopefully, the headaches will become well controlled again once the stress level is reduced.  Jill Alexanders MD 11/15/2015 10:46  AM  [facility] [address] [place], [place] [address]  Phone [phone] Fax [phone]

## 2015-11-22 ENCOUNTER — Telehealth: Payer: Self-pay | Admitting: Neurology

## 2015-11-22 NOTE — Telephone Encounter (Signed)
Talked to [employer], Tax inspector. She can do infusion this afternoon if pt can come now. Called to notify pt and she is currently on her way to [place]. Husband will drop her off for depacon injection.

## 2015-11-22 NOTE — Telephone Encounter (Signed)
Pt called back said she hasn't heard back. She thought the msg would be sent to [facility]. Her husband has an appt today at 38, they live in [place]. She said he could drop her off. Please call

## 2015-11-22 NOTE — Telephone Encounter (Signed)
The patient saw Dr. Jannifer Franklin last week (11/15/15). Zonegran was increased to 100 mg the morning, 150 mg in the evening. Trazodone 50 mg was added at night for sleep. The patient was also given a prednisone Dosepak (5 mg x 6 days). Dr. Jannifer Franklin told pt that if the headaches did not abate, we could have her come back in for a Depacon injection.

## 2015-11-22 NOTE — Telephone Encounter (Signed)
Ok for migraine infusion. -VRP

## 2015-11-22 NOTE — Telephone Encounter (Signed)
Pt called in stating she still has a migraine and discussed a depacon injection with Dr. Jannifer Franklin. She would like to discuss this further with the nurse. She would like to come in today if possible. Please call and advise

## 2015-11-30 ENCOUNTER — Emergency Department (HOSPITAL_BASED_OUTPATIENT_CLINIC_OR_DEPARTMENT_OTHER)
Admission: EM | Admit: 2015-11-30 | Discharge: 2015-11-30 | Disposition: A | Discharge status: Home / Self Care | Payer: BLUE CROSS/BLUE SHIELD | Attending: Physician Assistant | Admitting: Physician Assistant

## 2015-11-30 ENCOUNTER — Encounter (HOSPITAL_BASED_OUTPATIENT_CLINIC_OR_DEPARTMENT_OTHER): Payer: Self-pay

## 2015-11-30 DIAGNOSIS — R109 Unspecified abdominal pain: Secondary | ICD-10-CM | POA: Diagnosis not present

## 2015-11-30 DIAGNOSIS — E860 Dehydration: Secondary | ICD-10-CM

## 2015-11-30 DIAGNOSIS — J45909 Unspecified asthma, uncomplicated: Secondary | ICD-10-CM | POA: Insufficient documentation

## 2015-11-30 DIAGNOSIS — R1110 Vomiting, unspecified: Secondary | ICD-10-CM | POA: Diagnosis present

## 2015-11-30 DIAGNOSIS — Z87891 Personal history of nicotine dependence: Secondary | ICD-10-CM | POA: Insufficient documentation

## 2015-11-30 DIAGNOSIS — R112 Nausea with vomiting, unspecified: Secondary | ICD-10-CM

## 2015-11-30 DIAGNOSIS — K529 Noninfective gastroenteritis and colitis, unspecified: Secondary | ICD-10-CM | POA: Diagnosis not present

## 2015-11-30 DIAGNOSIS — R197 Diarrhea, unspecified: Secondary | ICD-10-CM

## 2015-11-30 LAB — I-STAT CG4 LACTIC ACID, ED: Lactic Acid, Venous: 0.74 mmol/L (ref 0.5–1.9)

## 2015-11-30 LAB — COMPREHENSIVE METABOLIC PANEL
ALT: 26 U/L (ref 14–54)
AST: 24 U/L (ref 15–41)
Albumin: 3.8 g/dL (ref 3.5–5.0)
Alkaline Phosphatase: 52 U/L (ref 38–126)
Anion gap: 6 (ref 5–15)
BUN: 9 mg/dL (ref 6–20)
CO2: 25 mmol/L (ref 22–32)
Calcium: 8.8 mg/dL — ABNORMAL LOW (ref 8.9–10.3)
Chloride: 110 mmol/L (ref 101–111)
Creatinine, Ser: 0.97 mg/dL (ref 0.44–1.00)
GFR calc Af Amer: >60 mL/min (ref >60)
GFR calc non Af Amer: >60 mL/min (ref >60)
Glucose, Bld: 99 mg/dL (ref 65–99)
Potassium: 3.7 mmol/L (ref 3.5–5.1)
Sodium: 141 mmol/L (ref 135–145)
Total Bilirubin: 0.3 mg/dL (ref 0.3–1.2)
Total Protein: 6.5 g/dL (ref 6.5–8.1)

## 2015-11-30 LAB — CBC WITH DIFFERENTIAL/PLATELET
Basophils Absolute: 0.1 10*3/uL (ref 0.0–0.1)
Basophils Relative: 1 %
Eosinophils Absolute: 0 10*3/uL (ref 0.0–0.7)
Eosinophils Relative: 0 %
HCT: 37.9 % (ref 36.0–46.0)
Hemoglobin: 13 g/dL (ref 12.0–15.0)
Lymphocytes Relative: 40 %
Lymphs Abs: 2.5 10*3/uL (ref 0.7–4.0)
MCH: 31.1 pg (ref 26.0–34.0)
MCHC: 34.3 g/dL (ref 30.0–36.0)
MCV: 90.7 fL (ref 78.0–100.0)
Monocytes Absolute: 0.6 10*3/uL (ref 0.1–1.0)
Monocytes Relative: 9 %
Neutro Abs: 3 10*3/uL (ref 1.7–7.7)
Neutrophils Relative %: 50 %
Platelets: 195 10*3/uL (ref 150–400)
RBC: 4.18 MIL/uL (ref 3.87–5.11)
RDW: 13.1 % (ref 11.5–15.5)
WBC: 6.2 10*3/uL (ref 4.0–10.5)

## 2015-11-30 LAB — URINALYSIS, ROUTINE W REFLEX MICROSCOPIC
Bilirubin Urine: NEGATIVE
Glucose, UA: NEGATIVE mg/dL
Hgb urine dipstick: NEGATIVE
Ketones, ur: NEGATIVE mg/dL
Leukocytes, UA: NEGATIVE
Nitrite: NEGATIVE
Protein, ur: NEGATIVE mg/dL
Specific Gravity, Urine: 1.012 (ref 1.005–1.030)
pH: 7 (ref 5.0–8.0)

## 2015-11-30 LAB — LIPASE, BLOOD: Lipase: 21 U/L (ref 11–51)

## 2015-11-30 MED ORDER — PROMETHAZINE HCL 25 MG/ML IJ SOLN
25.0000 mg | Freq: Once | INTRAMUSCULAR | Status: AC
  Administered 2015-11-30: 25 mg via INTRAVENOUS
  Filled 2015-11-30: qty 1

## 2015-11-30 MED ORDER — ONDANSETRON HCL 4 MG/2ML IJ SOLN
4.0000 mg | Freq: Once | INTRAMUSCULAR | Status: AC
  Administered 2015-11-30: 4 mg via INTRAVENOUS
  Filled 2015-11-30: qty 2

## 2015-11-30 MED ORDER — PROMETHAZINE HCL 25 MG PO TABS: 25.0000 mg | ORAL_TABLET | Freq: Four times a day (QID) | ORAL | 0 refills | Status: AC | PRN

## 2015-11-30 MED ORDER — SODIUM CHLORIDE 0.9 % IV BOLUS (SEPSIS)
1000.0000 mL | Freq: Once | INTRAVENOUS | Status: AC
  Administered 2015-11-30: 1000 mL via INTRAVENOUS

## 2015-11-30 NOTE — ED Notes (Signed)
Patient ambulated to the restroom with tech stand by assist.

## 2015-11-30 NOTE — ED Triage Notes (Signed)
C/o n/v/d since last Thursday-multiple mosquito bites while camping 1-2 days prior to s/s-NAD-steady gait

## 2015-11-30 NOTE — ED Provider Notes (Signed)
[place] DEPT [facility] Provider Note   CSN: [id number] Arrival date & time: 11/30/15  1657  By signing my name below, I, Jeanell Sparrow, attest that this documentation has been prepared under the direction and in the presence of non-physician practitioner, Irena Cords, PA-C. Electronically Signed: Jeanell Sparrow, Scribe. 11/30/2015. 5:20 PM.   History   Chief Complaint Chief Complaint  Patient presents with  . Emesis   IThe history is provided by the patient and the spouse. No language interpreter was used.   HPI Comments: INEZE SUSI is a 65 y.o. female who presents to the Emergency Department complaining of intermittent moderate vomiting that started 4 days ago. Husband states that pt started having chills 5 days ago when they went camping and pt got bit by mosquitoes, which progressed to nausea, vomiting, diarrhea, diaphoresis, confusion and headache the next day. Pt states that her vomiting improved slightly last night, as now she can keep down liquids, but still not solids. She reports also having a back rash, abdominal pain, and a fever of 104 last night. Pt's temperature in the ED today was 97.8. She reports she was advised to come to the ED by her PCP. She denies any use of antibiotics. She also denies any bloody stools, cough, or rhinorrhea.      PCP: Jani Gravel, MD  Past Medical History:  Diagnosis Date  . Allergic rhinitis   . Asthma   . Asthma    inhaler prn  . Celiac disease   . Depression with anxiety   . Dysmenorrhea   . Gastroparesis   . GERD (gastroesophageal reflux disease)   . Hx of low back pain   . IBS (irritable bowel syndrome)   . Migraine   . Migraine without aura, with intractable migraine, so stated, without mention of status migrainosus 05/20/2013  . Osteopenia     Patient Active Problem List   Diagnosis Date Noted  . Chest pain 06/15/2013  . Intractable migraine without aura 05/20/2013    Past Surgical History:  Procedure Laterality  Date  . ABDOMINAL SURGERY    . BREAST BIOPSY Right    x2  . COLONOSCOPY    . FOOT SURGERY Left    Bunionectomy  . MASS EXCISION  08/08/2011   Procedure: MINOR EXCISION OF MASS;  Surgeon: Cammie Sickle., MD;  Location: [place];  Service: Orthopedics;  Laterality: Left;  left ring excision cyst proximal phalangeal joint  . NISSEN FUNDOPLICATION    . ROTATOR CUFF REPAIR Right 05/2015  . TONSILLECTOMY      OB History    No data available       Home Medications    Prior to Admission medications   Medication Sig Start Date End Date Taking? Authorizing Provider  albuterol (PROVENTIL HFA;VENTOLIN HFA) 108 (90 BASE) MCG/ACT inhaler Inhale 1 puff into the lungs every 6 (six) hours as needed for wheezing or shortness of breath.    Historical Provider, MD  Calcium Carb-Cholecalciferol 600-100 MG-UNIT CAPS Take by mouth.    Historical Provider, MD  Coenzyme Q10 (COQ10) 100 MG CAPS Take 1 tablet by mouth daily.    Historical Provider, MD  DEXILANT 60 MG capsule  10/06/15   Historical Provider, MD  Eszopiclone (ESZOPICLONE) 3 MG TABS Take 3 mg by mouth at bedtime. Take immediately before bedtime    Historical Provider, MD  fish oil-omega-3 fatty acids 1000 MG capsule Take 2 g by mouth daily.      Historical Provider,  MD  folic acid (FOLVITE) A999333 MCG tablet Take 800 mcg by mouth daily.     Historical Provider, MD  Garlic (GARLIQUE PO) Take by mouth daily.    Historical Provider, MD  Glucosamine-Chondroitin (OSTEO BI-FLEX REGULAR STRENGTH PO) Take 2 tablets by mouth daily.    Historical Provider, MD  magnesium 30 MG tablet Take 30 mg by mouth daily.    Historical Provider, MD  Misc Natural Products (OSTEO BI-FLEX ADV JOINT SHIELD PO) Take 2 tablets by mouth daily.     Historical Provider, MD  montelukast (SINGULAIR) 10 MG tablet Take 10 mg by mouth at bedtime.  11/03/13   Historical Provider, MD  Multiple Vitamins-Minerals (PRESERVISION AREDS 2) CAPS Take 1 capsule by mouth 2  (two) times daily.    Historical Provider, MD  predniSONE (DELTASONE) 5 MG tablet Begin taking 6 tablets daily, taper by one tablet daily until off the medication. 11/15/15   Kathrynn Ducking, MD  Probiotic Product (PROBIOTIC DAILY PO) Take 1 tablet by mouth daily.    Historical Provider, MD  promethazine (PHENERGAN) 25 MG tablet Take 25 mg by mouth daily as needed for nausea or vomiting.  03/14/13   Historical Provider, MD  rizatriptan (MAXALT) 10 MG tablet TAKE 1 TABLET AS NEEDED FOR MIGRAINE. MAY REPEAT IN 2 HOURS IF NEEDED. MAXIMUM 3 TABLETS DAILY. 11/29/14   Kathrynn Ducking, MD  traZODone (DESYREL) 50 MG tablet Take 1 tablet (50 mg total) by mouth at bedtime. 11/15/15   Kathrynn Ducking, MD  Vortioxetine HBr (BRINTELLIX) 20 MG TABS Take 20 mg by mouth at bedtime.    Historical Provider, MD  zonisamide (ZONEGRAN) 100 MG capsule TAKE 1 CAPSULE TWICE A DAY 08/24/15   Kathrynn Ducking, MD  zonisamide (ZONEGRAN) 25 MG capsule Take 2 capsules (50 mg total) by mouth at bedtime. 11/15/15   Kathrynn Ducking, MD    Family History Family History  Problem Relation Age of Onset  . Stroke Father   . Dementia Father   . Atrial fibrillation Father   . Cancer Father   . Hypertension Mother   . Scoliosis Mother   . Heart disease Sister     Congenital heart disease  . Migraines Neg Hx     Social History Social History  Substance Use Topics  . Smoking status: Former Research scientist (life sciences)  . Smokeless tobacco: Never Used     Comment: IN college 1 year  . Alcohol use 0.0 oz/week     Comment: wine occasionally     Allergies   Adhesive [tape] and Morphine and related   Review of Systems Review of Systems  Constitutional: Positive for appetite change, chills, diaphoresis and fever.  HENT: Negative for rhinorrhea.   Respiratory: Negative for cough.   Gastrointestinal: Positive for abdominal pain, diarrhea, nausea and vomiting. Negative for blood in stool.  Skin: Positive for rash (back).  Neurological: Positive  for headaches.  Psychiatric/Behavioral: Positive for confusion.  All other systems reviewed and are negative.    Physical Exam Updated Vital Signs BP 115/75 (BP Location: Left Arm)   Pulse 68   Temp 97.8 F (36.6 C) (Oral)   Resp 18   Ht 5\' 10"  (1.778 m)   Wt 153 lb (69.4 kg)   SpO2 99%   BMI 21.95 kg/m   Physical Exam  Constitutional: She appears well-developed and well-nourished. No distress.  HENT:  Head: Normocephalic and atraumatic.  Mouth/Throat: No oropharyngeal exudate.  Eyes: Conjunctivae are normal.  Neck: Normal range  of motion. Neck supple.  Cardiovascular: Normal rate and regular rhythm.   No murmur heard. Pulmonary/Chest: Effort normal and breath sounds normal. No respiratory distress.  Abdominal: Soft. She exhibits no mass. There is no tenderness. There is no rebound and no guarding.  Musculoskeletal: She exhibits no edema.  Neurological: She is alert.  Skin: Skin is warm and dry. Rash noted.  Psychiatric: She has a normal mood and affect.  Nursing note and vitals reviewed.    ED Treatments / Results  DIAGNOSTIC STUDIES: Oxygen Saturation is 99% on RA, normal by my interpretation.    COORDINATION OF CARE: 5:24 PM- Pt advised of plan for treatment, which includes medication and labs, and pt agrees.  Labs (all labs ordered are listed, but only abnormal results are displayed) Labs Reviewed  COMPREHENSIVE METABOLIC PANEL - Abnormal; Notable for the following:       Result Value   Calcium 8.8 (*)    All other components within normal limits  URINALYSIS, ROUTINE W REFLEX MICROSCOPIC (NOT AT [facility]) - Abnormal; Notable for the following:    APPearance CLOUDY (*)    All other components within normal limits  URINE CULTURE  LIPASE, BLOOD  CBC WITH DIFFERENTIAL/PLATELET  ROCKY MTN SPOTTED FVR ABS PNL(IGG+IGM)  B. BURGDORFI ANTIBODIES  I-STAT CG4 LACTIC ACID, ED    EKG  EKG Interpretation None       Radiology No results  found.  Procedures Procedures (including critical care time)  Medications Ordered in ED Medications  sodium chloride 0.9 % bolus 1,000 mL (1,000 mLs Intravenous New Bag/Given 11/30/15 1751)  ondansetron (ZOFRAN) injection 4 mg (4 mg Intravenous Given 11/30/15 1803)     Initial Impression / Assessment and Plan / ED Course  I have reviewed the triage vital signs and the nursing notes.  Pertinent labs & imaging results that were available during my care of the patient were reviewed by me and considered in my medical decision making (see chart for details).  Clinical Course    Patient be treated with doxycycline for any possible tickborne illnesses.  She is advised follow-up with her primary care Dr. told to slowly increase her fluid intake and rest as much as possible.  Patient's laboratory testing did not show any significant abnormalities.  She has not vomited here, but was nauseated.  She has tolerated oral fluids.  At this time.  Told to return here as needed  Final Clinical Impressions(s) / ED Diagnoses   Final diagnoses:  None    New Prescriptions New Prescriptions   No medications on file      Dalia Heading, PA-C 11/30/15 [place], MD 12/04/15 [phone]

## 2015-11-30 NOTE — Discharge Instructions (Signed)
Follow-up with your primary care doctor.  Return here as needed.  Slowly increase her fluid intake, rest as much possible

## 2015-12-01 LAB — B. BURGDORFI ANTIBODIES: B burgdorferi Ab IgG+IgM: <0.91 {ISR} (ref 0.00–0.90)

## 2015-12-02 LAB — URINE CULTURE

## 2015-12-02 LAB — ROCKY MTN SPOTTED FVR ABS PNL(IGG+IGM)
RMSF IgG: UNDETERMINED
RMSF IgM: 0.22 index (ref 0.00–0.89)

## 2015-12-02 LAB — RMSF, IGG, IFA: RMSF, IGG, IFA: 1:64 {titer} — ABNORMAL HIGH

## 2015-12-03 ENCOUNTER — Telehealth (HOSPITAL_BASED_OUTPATIENT_CLINIC_OR_DEPARTMENT_OTHER): Payer: Self-pay | Admitting: *Deleted

## 2015-12-03 NOTE — Telephone Encounter (Signed)
Spoke with Veda Canning of [facility] Department. States pt is + for [facility] Spotted Fever and she needs contact number (only had pt's address) for patient for notification. Pt's home and mobile numbers provided

## 2016-01-26 ENCOUNTER — Ambulatory Visit (HOSPITAL_COMMUNITY)
Admission: EM | Admit: 2016-01-26 | Discharge: 2016-01-26 | Disposition: A | Discharge status: Home / Self Care | Payer: BLUE CROSS/BLUE SHIELD | Attending: Family Medicine | Admitting: Family Medicine

## 2016-01-26 ENCOUNTER — Encounter (HOSPITAL_COMMUNITY): Payer: Self-pay | Admitting: Family Medicine

## 2016-01-26 DIAGNOSIS — L03317 Cellulitis of buttock: Secondary | ICD-10-CM | POA: Diagnosis not present

## 2016-01-26 NOTE — ED Provider Notes (Signed)
[place]    CSN: [id number] Arrival date & time: 01/26/16  1438     History   Chief Complaint Chief Complaint  Patient presents with  . Abscess    HPI Katelyn Mcclain is a 65 y.o. female.   The history is provided by the patient.  Abscess  Location:  Pelvis Pelvic abscess location:  R buttock Size:  1cm Abscess quality: induration and painful   Abscess quality: not draining, no fluctuance, no redness, no warmth and not weeping   Abscess quality comment:  Currently on septra from [facility] with 3 more days of therapy. abscess resolving. Red streaking: no   Progression:  Partially resolved Pain details:    Severity:  Mild   Progression:  Improving Chronicity:  New Associated symptoms: no fever   Risk factors: hx of MRSA and prior abscess     Past Medical History:  Diagnosis Date  . Allergic rhinitis   . Asthma   . Asthma    inhaler prn  . Celiac disease   . Depression with anxiety   . Dysmenorrhea   . Gastroparesis   . GERD (gastroesophageal reflux disease)   . Hx of low back pain   . IBS (irritable bowel syndrome)   . Migraine   . Migraine without aura, with intractable migraine, so stated, without mention of status migrainosus 05/20/2013  . Osteopenia     Patient Active Problem List   Diagnosis Date Noted  . Chest pain 06/15/2013  . Intractable migraine without aura 05/20/2013    Past Surgical History:  Procedure Laterality Date  . ABDOMINAL SURGERY    . BREAST BIOPSY Right    x2  . COLONOSCOPY    . FOOT SURGERY Left    Bunionectomy  . MASS EXCISION  08/08/2011   Procedure: MINOR EXCISION OF MASS;  Surgeon: Cammie Sickle., MD;  Location: [facility];  Service: Orthopedics;  Laterality: Left;  left ring excision cyst proximal phalangeal joint  . NISSEN FUNDOPLICATION    . ROTATOR CUFF REPAIR Right 05/2015  . TONSILLECTOMY      OB History    No data available       Home Medications    Prior to Admission  medications   Medication Sig Start Date End Date Taking? Authorizing Provider  albuterol (PROVENTIL HFA;VENTOLIN HFA) 108 (90 BASE) MCG/ACT inhaler Inhale 1 puff into the lungs every 6 (six) hours as needed for wheezing or shortness of breath.    Historical Provider, MD  Calcium Carb-Cholecalciferol 600-100 MG-UNIT CAPS Take by mouth.    Historical Provider, MD  Coenzyme Q10 (COQ10) 100 MG CAPS Take 1 tablet by mouth daily.    Historical Provider, MD  DEXILANT 60 MG capsule  10/06/15   Historical Provider, MD  Eszopiclone (ESZOPICLONE) 3 MG TABS Take 3 mg by mouth at bedtime. Take immediately before bedtime    Historical Provider, MD  fish oil-omega-3 fatty acids 1000 MG capsule Take 2 g by mouth daily.      Historical Provider, MD  folic acid (FOLVITE) 163 MCG tablet Take 800 mcg by mouth daily.     Historical Provider, MD  Garlic (GARLIQUE PO) Take by mouth daily.    Historical Provider, MD  Glucosamine-Chondroitin (OSTEO BI-FLEX REGULAR STRENGTH PO) Take 2 tablets by mouth daily.    Historical Provider, MD  magnesium 30 MG tablet Take 30 mg by mouth daily.    Historical Provider, MD  Misc Natural Products (OSTEO BI-FLEX ADV  JOINT SHIELD PO) Take 2 tablets by mouth daily.     Historical Provider, MD  montelukast (SINGULAIR) 10 MG tablet Take 10 mg by mouth at bedtime.  11/03/13   Historical Provider, MD  Multiple Vitamins-Minerals (PRESERVISION AREDS 2) CAPS Take 1 capsule by mouth 2 (two) times daily.    Historical Provider, MD  predniSONE (DELTASONE) 5 MG tablet Begin taking 6 tablets daily, taper by one tablet daily until off the medication. 11/15/15   Kathrynn Ducking, MD  Probiotic Product (PROBIOTIC DAILY PO) Take 1 tablet by mouth daily.    Historical Provider, MD  promethazine (PHENERGAN) 25 MG tablet Take 1 tablet (25 mg total) by mouth every 6 (six) hours as needed for nausea or vomiting. 11/30/15   Dalia Heading, PA-C  rizatriptan (MAXALT) 10 MG tablet TAKE 1 TABLET AS NEEDED FOR  MIGRAINE. MAY REPEAT IN 2 HOURS IF NEEDED. MAXIMUM 3 TABLETS DAILY. 11/29/14   Kathrynn Ducking, MD  traZODone (DESYREL) 50 MG tablet Take 1 tablet (50 mg total) by mouth at bedtime. 11/15/15   Kathrynn Ducking, MD  Vortioxetine HBr (BRINTELLIX) 20 MG TABS Take 20 mg by mouth at bedtime.    Historical Provider, MD  zonisamide (ZONEGRAN) 100 MG capsule TAKE 1 CAPSULE TWICE A DAY 08/24/15   Kathrynn Ducking, MD  zonisamide (ZONEGRAN) 25 MG capsule Take 2 capsules (50 mg total) by mouth at bedtime. 11/15/15   Kathrynn Ducking, MD    Family History Family History  Problem Relation Age of Onset  . Stroke Father   . Dementia Father   . Atrial fibrillation Father   . Cancer Father   . Hypertension Mother   . Scoliosis Mother   . Heart disease Sister     Congenital heart disease  . Migraines Neg Hx     Social History Social History  Substance Use Topics  . Smoking status: Former Research scientist (life sciences)  . Smokeless tobacco: Never Used     Comment: IN college 1 year  . Alcohol use 0.0 oz/week     Comment: wine occasionally     Allergies   Adhesive [tape] and Morphine and related   Review of Systems Review of Systems  Constitutional: Negative for fever.  Skin: Positive for rash. Negative for wound.  All other systems reviewed and are negative.    Physical Exam Triage Vital Signs ED Triage Vitals [01/26/16 1510]  Enc Vitals Group     BP 124/73     Pulse Rate 71     Resp 18     Temp 97.6 F (36.4 C)     Temp src      SpO2 100 %     Weight      Height      Head Circumference      Peak Flow      Pain Score 5     Pain Loc      Pain Edu?      Excl. in [place]?    No data found.   Updated Vital Signs BP 124/73   Pulse 71   Temp 97.6 F (36.4 C)   Resp 18   SpO2 100%   Visual Acuity Right Eye Distance:   Left Eye Distance:   Bilateral Distance:    Right Eye Near:   Left Eye Near:    Bilateral Near:     Physical Exam  Constitutional: She is oriented to person, place, and time.  She appears well-developed and well-nourished. No distress.  Neurological: She is alert and oriented to person, place, and time.  Skin: Skin is warm and dry. There is erythema.  1cm right gluteal lesion, nonfluctuant, no drainage  Nursing note and vitals reviewed.    UC Treatments / Results  Labs (all labs ordered are listed, but only abnormal results are displayed) Labs Reviewed - No data to display  EKG  EKG Interpretation None       Radiology No results found.  Procedures Procedures (including critical care time)  Medications Ordered in UC Medications - No data to display   Initial Impression / Assessment and Plan / UC Course  I have reviewed the triage vital signs and the nursing notes.  Pertinent labs & imaging results that were available during my care of the patient were reviewed by me and considered in my medical decision making (see chart for details).  Clinical Course      Final Clinical Impressions(s) / UC Diagnoses   Final diagnoses:  None    New Prescriptions New Prescriptions   No medications on file     Billy Fischer, MD 01/26/16 1524

## 2016-01-26 NOTE — Discharge Instructions (Signed)
Finish antibiotic, continue warm soak. See your doctor or return if problems.

## 2016-01-26 NOTE — ED Triage Notes (Signed)
Pt here for abscess to right bottocks over a week. sts she was seen by her doctor and has been taking Bactrim and the redness is better but the area is hard.

## 2016-02-10 ENCOUNTER — Other Ambulatory Visit: Payer: Self-pay | Admitting: Internal Medicine

## 2016-02-10 DIAGNOSIS — Z1231 Encounter for screening mammogram for malignant neoplasm of breast: Secondary | ICD-10-CM

## 2016-02-15 ENCOUNTER — Ambulatory Visit (INDEPENDENT_AMBULATORY_CARE_PROVIDER_SITE_OTHER): Payer: BLUE CROSS/BLUE SHIELD | Admitting: Adult Health

## 2016-02-15 ENCOUNTER — Encounter: Payer: Self-pay | Admitting: Adult Health

## 2016-02-15 VITALS — BP 95/63 | HR 71 | Resp 16 | Ht 69.5 in | Wt 155.0 lb

## 2016-02-15 DIAGNOSIS — G43009 Migraine without aura, not intractable, without status migrainosus: Secondary | ICD-10-CM | POA: Diagnosis not present

## 2016-02-15 NOTE — Patient Instructions (Signed)
Continue Zonegran Use maxalt if needed If headache evolves and becomes persistent call  If your symptoms worsen or you develop new symptoms please let us know.

## 2016-02-15 NOTE — Progress Notes (Signed)
I have read the note, and I agree with the clinical assessment and plan.  WILLIS,CHARLES KEITH   

## 2016-02-15 NOTE — Progress Notes (Signed)
PATIENT: Katelyn Mcclain DOB: [birth year]  REASON FOR VISIT: follow up- migraine headaches HISTORY FROM: patient  HISTORY OF PRESENT ILLNESS: Katelyn Mcclain as a 65 year old female with a history of migraine headaches. She returns today for follow-up. She states that at the last visit she received an infusion for her headache. Since then she has not had any headaches. She has been 2 months with no headaches at all. She does state that a couple days after the infusion she was diagnosed with Escherichia coli infection. She states that she was in bed for approximately 3 weeks. She feels that maybe her headache frequency improved during that time because she was in bed resting. She states that yesterday she started to feel a "headache looming in the background." She states that she did take Maxalt but she still has that sensation. She states she did try trazodone for sleep however during that same time she was diagnosed with Escherichia coli. She has not tried this medication since. She continues to take Zonegran 100 mg in the morning and 150 mg at bedtime. She does note that stress is a trigger for her migraines. Currently both her parents has health issues that she has to deal with. She returns today for evaluation.   HISTORY 11/15/15: [name] is a 65 year old right-handed white female with a history of migraine headache. The patient has recently been under stress with illness of her father and the fact that her sister has come to visit. The patient has had a headache that has been going on daily for the last week. In the past, she has had allergy associated headaches. The patient is having a lot of photophobia and phonophobia with her current headache. She denies nausea and vomiting. The patient indicates that the Maxalt was not beneficial for these headaches. She is not sleeping well with the stress. She takes Lunesta at night but she is only getting 1 or 2 hours of sleep. She returns to this office  for an evaluation. In the past, prednisone has been helpful to suppress her headache.   REVIEW OF SYSTEMS: Out of a complete 14 system review of symptoms, the patient complains only of the following symptoms, and all other reviewed systems are negative.  See history of present illness  ALLERGIES: Allergies  Allergen Reactions  . Adhesive [Tape] Other (See Comments)    Tear's skin off   . Morphine And Related Other (See Comments)    Sensitive to narcotics, worsening migraines.    HOME MEDICATIONS: Outpatient Medications Prior to Visit  Medication Sig Dispense Refill  . albuterol (PROVENTIL HFA;VENTOLIN HFA) 108 (90 BASE) MCG/ACT inhaler Inhale 1 puff into the lungs every 6 (six) hours as needed for wheezing or shortness of breath.    . Calcium Carb-Cholecalciferol 600-100 MG-UNIT CAPS Take by mouth.    . Coenzyme Q10 (COQ10) 100 MG CAPS Take 1 tablet by mouth daily.    Marland Kitchen DEXILANT 60 MG capsule     . Eszopiclone (ESZOPICLONE) 3 MG TABS Take 3 mg by mouth at bedtime. Take immediately before bedtime    . fish oil-omega-3 fatty acids 1000 MG capsule Take 2 g by mouth daily.      . folic acid (FOLVITE) [id number] MCG tablet Take 800 mcg by mouth daily.     . Garlic (GARLIQUE PO) Take by mouth daily.    . Glucosamine-Chondroitin (OSTEO BI-FLEX REGULAR STRENGTH PO) Take 2 tablets by mouth daily.    . magnesium 30 MG tablet Take 30  mg by mouth daily.    . Misc Natural Products (OSTEO BI-FLEX ADV JOINT SHIELD PO) Take 2 tablets by mouth daily.     . montelukast (SINGULAIR) 10 MG tablet Take 10 mg by mouth at bedtime.     . Multiple Vitamins-Minerals (PRESERVISION AREDS 2) CAPS Take 1 capsule by mouth 2 (two) times daily.    . Probiotic Product (PROBIOTIC DAILY PO) Take 1 tablet by mouth daily.    . promethazine (PHENERGAN) 25 MG tablet Take 1 tablet (25 mg total) by mouth every 6 (six) hours as needed for nausea or vomiting. 10 tablet 0  . rizatriptan (MAXALT) 10 MG tablet TAKE 1 TABLET AS NEEDED FOR  MIGRAINE. MAY REPEAT IN 2 HOURS IF NEEDED. MAXIMUM 3 TABLETS DAILY. 36 tablet 1  . Vortioxetine HBr (BRINTELLIX) 20 MG TABS Take 20 mg by mouth at bedtime.    Marland Kitchen zonisamide (ZONEGRAN) 100 MG capsule TAKE 1 CAPSULE TWICE A DAY 180 capsule 3  . zonisamide (ZONEGRAN) 25 MG capsule Take 2 capsules (50 mg total) by mouth at bedtime. 180 capsule 1  . predniSONE (DELTASONE) 5 MG tablet Begin taking 6 tablets daily, taper by one tablet daily until off the medication. 21 tablet 0  . traZODone (DESYREL) 50 MG tablet Take 1 tablet (50 mg total) by mouth at bedtime. 30 tablet 1   Facility-Administered Medications Prior to Visit  Medication Dose Route Frequency Provider Last Rate Last Dose  . albuterol (PROVENTIL) (2.5 MG/3ML) 0.083% nebulizer solution 2.5 mg  2.5 mg Nebulization Once Ryan M Dunn, PA-C      . ipratropium (ATROVENT) nebulizer solution 0.5 mg  0.5 mg Nebulization Once Rise Mu, PA-C        PAST MEDICAL HISTORY: Past Medical History:  Diagnosis Date  . Allergic rhinitis   . Asthma   . Asthma    inhaler prn  . Celiac disease   . Depression with anxiety   . Dysmenorrhea   . Gastroparesis   . GERD (gastroesophageal reflux disease)   . Hx of low back pain   . IBS (irritable bowel syndrome)   . Migraine   . Migraine without aura, with intractable migraine, so stated, without mention of status migrainosus 05/20/2013  . Osteopenia     PAST SURGICAL HISTORY: Past Surgical History:  Procedure Laterality Date  . ABDOMINAL SURGERY    . BREAST BIOPSY Right    x2  . COLONOSCOPY    . FOOT SURGERY Left    Bunionectomy  . MASS EXCISION  08/08/2011   Procedure: MINOR EXCISION OF MASS;  Surgeon: Cammie Sickle., MD;  Location: [facility];  Service: Orthopedics;  Laterality: Left;  left ring excision cyst proximal phalangeal joint  . NISSEN FUNDOPLICATION    . ROTATOR CUFF REPAIR Right 05/2015  . TONSILLECTOMY      FAMILY HISTORY: Family History  Problem Relation  Age of Onset  . Stroke Father   . Dementia Father   . Atrial fibrillation Father   . Cancer Father   . Hypertension Mother   . Scoliosis Mother   . Heart disease Sister     Congenital heart disease  . Migraines Neg Hx     SOCIAL HISTORY: Social History   Social History  . Marital status: Married    Spouse name: N/A  . Number of children: 0  . Years of education: college   Occupational History  .  [employer]  Social History Main Topics  . Smoking status: Former Research scientist (life sciences)  . Smokeless tobacco: Never Used     Comment: IN college 1 year  . Alcohol use 0.0 oz/week     Comment: wine occasionally  . Drug use: No  . Sexual activity: Yes    Birth control/ protection: Post-menopausal   Other Topics Concern  . Not on file   Social History Narrative   Patient lives at home with her husband Hoy Morn).   Patient is retired.   Education college.   Right handed.    Caffeine one cup daily.      PHYSICAL EXAM  Vitals:   02/15/16 1344  BP: 95/63  Pulse: 71  Resp: 16  Weight: 155 lb (70.3 kg)  Height: 5' 9.5" (1.765 m)   Body mass index is 22.56 kg/m.  Generalized: Well developed, in no acute distress   Neurological examination  Mentation: Alert oriented to time, place, history taking. Follows all commands speech and language fluent Cranial nerve II-XII: Pupils were equal round reactive to light. Extraocular movements were full, visual field were full on confrontational test. Facial sensation and strength were normal. Uvula tongue midline. Head turning and shoulder shrug  were normal and symmetric. Motor: The motor testing reveals 5 over 5 strength of all 4 extremities. Good symmetric motor tone is noted throughout.  Sensory: Sensory testing is intact to soft touch on all 4 extremities. No evidence of extinction is noted.  Coordination: Cerebellar testing reveals good finger-nose-finger and heel-to-shin bilaterally.  Gait and station: Gait is normal.  Tandem gait is normal. Romberg is negative. No drift is seen.  Reflexes: Deep tendon reflexes are symmetric and normal bilaterally.   DIAGNOSTIC DATA (LABS, IMAGING, TESTING) - I reviewed patient records, labs, notes, testing and imaging myself where available.  Lab Results  Component Value Date   WBC 6.2 11/30/2015   HGB 13.0 11/30/2015   HCT 37.9 11/30/2015   MCV 90.7 11/30/2015   PLT 195 11/30/2015      Component Value Date/Time   NA 141 11/30/2015 1745   K 3.7 11/30/2015 1745   CL 110 11/30/2015 1745   CO2 25 11/30/2015 1745   GLUCOSE 99 11/30/2015 1745   BUN 9 11/30/2015 1745   CREATININE 0.97 11/30/2015 1745   CALCIUM 8.8 (L) 11/30/2015 1745   PROT 6.5 11/30/2015 1745   ALBUMIN 3.8 11/30/2015 1745   AST 24 11/30/2015 1745   ALT 26 11/30/2015 1745   ALKPHOS 52 11/30/2015 1745   BILITOT 0.3 11/30/2015 1745   GFRNONAA >60 11/30/2015 1745   GFRAA >60 11/30/2015 1745      ASSESSMENT AND PLAN 65 y.o. year old female  has a past medical history of Allergic rhinitis; Asthma; Asthma; Celiac disease; Depression with anxiety; Dysmenorrhea; Gastroparesis; GERD (gastroesophageal reflux disease); low back pain; IBS (irritable bowel syndrome); Migraine; Migraine without aura, with intractable migraine, so stated, without mention of status migrainosus (05/20/2013); and Osteopenia. here with:  1. Migraine headaches  The patient's headaches have been under relatively good control for the last 2 months. She will continue on Zonegran 100 mg in the morning 150 mg in the evening. Patient advised that if her headache evolves and becomes persistent she will let us know. At that time we will consider an IV infusion or steroid Dosepak. Patient is amenable to this plan. She will continue to use Maxalt as needed. If her symptoms worsen or she develops any new symptoms she will let us know. She will follow-up in  6 months or sooner if needed.     Ward Givens, MSN, NP-C 02/15/2016, 2:05  PM [facility] [address], [place], [place] [address] [phone]

## 2016-02-24 ENCOUNTER — Ambulatory Visit
Admission: RE | Admit: 2016-02-24 | Discharge: 2016-02-24 | Disposition: A | Discharge status: Home / Self Care | Payer: BLUE CROSS/BLUE SHIELD | Source: Ambulatory Visit | Attending: Internal Medicine | Admitting: Internal Medicine

## 2016-02-24 DIAGNOSIS — Z1231 Encounter for screening mammogram for malignant neoplasm of breast: Secondary | ICD-10-CM

## 2016-03-28 ENCOUNTER — Telehealth: Payer: Self-pay | Admitting: Neurology

## 2016-03-28 ENCOUNTER — Other Ambulatory Visit: Payer: Self-pay

## 2016-03-28 NOTE — Telephone Encounter (Signed)
Zonegran 25 mg rx sent to [facility] 11/15/15 w/ 6 mos of refills. Next refill will be due in Feb.

## 2016-03-28 NOTE — Telephone Encounter (Signed)
Patient requesting refill of zonisamide (ZONEGRAN) 25 MG capsule called to [facility].

## 2016-06-01 ENCOUNTER — Ambulatory Visit
Admission: RE | Admit: 2016-06-01 | Discharge: 2016-06-01 | Disposition: A | Discharge status: Home / Self Care | Payer: BLUE CROSS/BLUE SHIELD | Source: Ambulatory Visit | Attending: Internal Medicine | Admitting: Internal Medicine

## 2016-06-01 ENCOUNTER — Other Ambulatory Visit: Payer: Self-pay | Admitting: Internal Medicine

## 2016-06-01 DIAGNOSIS — R1031 Right lower quadrant pain: Secondary | ICD-10-CM

## 2016-06-01 MED ORDER — IOPAMIDOL (ISOVUE-300) INJECTION 61%
100.0000 mL | Freq: Once | INTRAVENOUS | Status: AC | PRN
  Administered 2016-06-01: 100 mL via INTRAVENOUS

## 2016-06-05 ENCOUNTER — Other Ambulatory Visit: Payer: Self-pay | Admitting: Neurology

## 2016-06-12 ENCOUNTER — Other Ambulatory Visit: Payer: Self-pay | Admitting: General Surgery

## 2016-06-12 ENCOUNTER — Ambulatory Visit (HOSPITAL_COMMUNITY)
Admission: RE | Admit: 2016-06-12 | Discharge: 2016-06-12 | Disposition: A | Discharge status: Home / Self Care | Payer: BLUE CROSS/BLUE SHIELD | Source: Ambulatory Visit | Attending: General Surgery | Admitting: General Surgery

## 2016-06-12 DIAGNOSIS — K219 Gastro-esophageal reflux disease without esophagitis: Secondary | ICD-10-CM | POA: Insufficient documentation

## 2016-06-12 DIAGNOSIS — K9189 Other postprocedural complications and disorders of digestive system: Secondary | ICD-10-CM | POA: Diagnosis present

## 2016-06-13 ENCOUNTER — Other Ambulatory Visit (HOSPITAL_COMMUNITY): Payer: Self-pay | Admitting: General Surgery

## 2016-06-13 DIAGNOSIS — R1011 Right upper quadrant pain: Secondary | ICD-10-CM

## 2016-06-16 ENCOUNTER — Ambulatory Visit (HOSPITAL_COMMUNITY)
Admission: RE | Admit: 2016-06-16 | Discharge: 2016-06-16 | Disposition: A | Discharge status: Home / Self Care | Payer: BLUE CROSS/BLUE SHIELD | Source: Ambulatory Visit | Attending: General Surgery | Admitting: General Surgery

## 2016-06-16 DIAGNOSIS — R1011 Right upper quadrant pain: Secondary | ICD-10-CM | POA: Diagnosis not present

## 2016-06-16 MED ORDER — TECHNETIUM TC 99M MEBROFENIN IV KIT
5.4000 | PACK | Freq: Once | INTRAVENOUS | Status: AC | PRN
  Administered 2016-06-16: 5.4 via INTRAVENOUS

## 2016-06-22 ENCOUNTER — Other Ambulatory Visit: Payer: Self-pay | Admitting: Gastroenterology

## 2016-06-22 DIAGNOSIS — R1084 Generalized abdominal pain: Secondary | ICD-10-CM

## 2016-06-23 ENCOUNTER — Ambulatory Visit (HOSPITAL_COMMUNITY)
Admission: RE | Admit: 2016-06-23 | Discharge: 2016-06-23 | Disposition: A | Discharge status: Home / Self Care | Payer: BLUE CROSS/BLUE SHIELD | Source: Ambulatory Visit | Attending: Gastroenterology | Admitting: Gastroenterology

## 2016-06-23 DIAGNOSIS — D1803 Hemangioma of intra-abdominal structures: Secondary | ICD-10-CM | POA: Insufficient documentation

## 2016-06-23 DIAGNOSIS — R109 Unspecified abdominal pain: Secondary | ICD-10-CM | POA: Diagnosis not present

## 2016-06-23 DIAGNOSIS — R1084 Generalized abdominal pain: Secondary | ICD-10-CM

## 2016-07-18 ENCOUNTER — Telehealth: Payer: Self-pay | Admitting: Neurology

## 2016-07-18 NOTE — Telephone Encounter (Signed)
Pt is having colonoscopy tomorrow and will begin prepping today at 4pm. She is wanting to know if gavilyte-e solution could trigger migraines or Meniere's disease. Please call

## 2016-07-18 NOTE — Telephone Encounter (Signed)
I called the patient. I'm not aware that the bowel prep for colonoscopy is a big activator for migraine or Mnire's disease, the patient should be able to continue her medications throughout the procedure.  It is possible that the stress of the procedure may bring on a migraine.

## 2016-07-19 ENCOUNTER — Other Ambulatory Visit: Payer: Self-pay | Admitting: Gastroenterology

## 2016-07-19 DIAGNOSIS — K449 Diaphragmatic hernia without obstruction or gangrene: Secondary | ICD-10-CM

## 2016-08-10 ENCOUNTER — Other Ambulatory Visit: Payer: Self-pay | Admitting: Neurology

## 2016-08-23 ENCOUNTER — Ambulatory Visit (INDEPENDENT_AMBULATORY_CARE_PROVIDER_SITE_OTHER): Payer: BLUE CROSS/BLUE SHIELD | Admitting: Adult Health

## 2016-08-23 ENCOUNTER — Encounter (INDEPENDENT_AMBULATORY_CARE_PROVIDER_SITE_OTHER): Payer: Self-pay

## 2016-08-23 ENCOUNTER — Encounter: Payer: Self-pay | Admitting: Adult Health

## 2016-08-23 VITALS — BP 99/71 | HR 68 | Ht 69.5 in | Wt 152.6 lb

## 2016-08-23 DIAGNOSIS — G43009 Migraine without aura, not intractable, without status migrainosus: Secondary | ICD-10-CM

## 2016-08-23 MED ORDER — RIZATRIPTAN BENZOATE 10 MG PO TBDP: ORAL_TABLET | ORAL | 11 refills | Status: DC

## 2016-08-23 NOTE — Progress Notes (Signed)
I have read the note, and I agree with the clinical assessment and plan.  Keily Lepp KEITH   

## 2016-08-23 NOTE — Patient Instructions (Addendum)
Continue Zonegran Maxalt- take at the onset of migraine- can repeat in 2 hours if needed If your symptoms worsen or you develop new symptoms please let us know.

## 2016-08-23 NOTE — Progress Notes (Signed)
PATIENT: Katelyn Mcclain DOB: [birth year]  REASON FOR VISIT: follow up- migraine headache HISTORY FROM: patient  HISTORY OF PRESENT ILLNESS: Katelyn Mcclain is a 66 year old female with a history of migraine headaches. She returns today for follow-up. She continues on Zonegran 100 mg in the morning and 150 mg at bedtime. She states that her #1 trigger for her headaches is stress related to her parents health. She reports that her dad is now on hospice care and her mother is also in a nursing home. She states that her headaches normally occur when her dad has a health issue. She states in the past month she has had 7 headaches. Her headaches typically respond to Maxalt but not always. She does state that she spent a weekend camping and was headache free. She does state that sometimes she tries to "ride the headache out" before taking Maxalt. She returns today for an evaluation.  HISTORY 02/15/16: Katelyn Mcclain as a 66 year old female with a history of migraine headaches. She returns today for follow-up. She states that at the last visit she received an infusion for her headache. Since then she has not had any headaches. She has been 2 months with no headaches at all. She does state that a couple days after the infusion she was diagnosed with Escherichia coli infection. She states that she was in bed for approximately 3 weeks. She feels that maybe her headache frequency improved during that time because she was in bed resting. She states that yesterday she started to feel a "headache looming in the background." She states that she did take Maxalt but she still has that sensation. She states she did try trazodone for sleep however during that same time she was diagnosed with Escherichia coli. She has not tried this medication since. She continues to take Zonegran 100 mg in the morning and 150 mg at bedtime. She does note that stress is a trigger for her migraines. Currently both her parents has health issues  that she has to deal with. She returns today for evaluation.   HISTORY 11/15/15: Katelyn Mcclain a 66 year old right-handed white female with a history of migraine headache. The patient has recently been under stress with illness of her father and the fact that her sister has come to visit. The patient has had a headache that has been going on daily for the last week. In the past, she has had allergy associated headaches. The patient is having a lot of photophobia and phonophobia with her current headache. She denies nausea and vomiting. The patient indicates that the Maxalt was not beneficial for these headaches. She is not sleeping well with the stress. She takes Lunesta at night but she is only getting 1 or 2 hours of sleep. She returns to this office for an evaluation. In the past, prednisone has been helpful to suppress her headache.    REVIEW OF SYSTEMS: Out of a complete 14 system review of symptoms, the patient complains only of the following symptoms, and all other reviewed systems are negative.  Blurred vision, food allergies  ALLERGIES: Allergies  Allergen Reactions  . Adhesive [Tape] Other (See Comments)    Tear's skin off   . Morphine And Related Other (See Comments)    Sensitive to narcotics, worsening migraines.    HOME MEDICATIONS: Outpatient Medications Prior to Visit  Medication Sig Dispense Refill  . albuterol (PROVENTIL HFA;VENTOLIN HFA) 108 (90 BASE) MCG/ACT inhaler Inhale 1 puff into the lungs every 6 (six) hours as needed  for wheezing or shortness of breath.    . Calcium Carb-Cholecalciferol 600-100 MG-UNIT CAPS Take by mouth.    . Coenzyme Q10 (COQ10) 100 MG CAPS Take 1 tablet by mouth daily.    Marland Kitchen DEXILANT 60 MG capsule     . Eszopiclone (ESZOPICLONE) 3 MG TABS Take 3 mg by mouth at bedtime. Take immediately before bedtime    . fish oil-omega-3 fatty acids 1000 MG capsule Take 2 g by mouth daily.      . folic acid (FOLVITE) 417 MCG tablet Take 800 mcg by mouth  daily.     . Garlic (GARLIQUE PO) Take by mouth daily.    . Glucosamine-Chondroitin (OSTEO BI-FLEX REGULAR STRENGTH PO) Take 2 tablets by mouth daily.    . magnesium 30 MG tablet Take 30 mg by mouth daily.    . Misc Natural Products (OSTEO BI-FLEX ADV JOINT SHIELD PO) Take 2 tablets by mouth daily.     . montelukast (SINGULAIR) 10 MG tablet Take 10 mg by mouth at bedtime.     . Multiple Vitamins-Minerals (PRESERVISION AREDS 2) CAPS Take 1 capsule by mouth 2 (two) times daily.    . Probiotic Product (PROBIOTIC DAILY PO) Take 1 tablet by mouth daily.    . promethazine (PHENERGAN) 25 MG tablet Take 1 tablet (25 mg total) by mouth every 6 (six) hours as needed for nausea or vomiting. 10 tablet 0  . rizatriptan (MAXALT) 10 MG tablet TAKE 1 TABLET AS NEEDED FOR MIGRAINE. MAY REPEAT IN 2 HOURS IF NEEDED. MAXIMUM 3 TABLETS DAILY. 36 tablet 1  . Vortioxetine HBr (BRINTELLIX) 20 MG TABS Take 20 mg by mouth at bedtime.    Marland Kitchen zonisamide (ZONEGRAN) 100 MG capsule TAKE 1 CAPSULE TWICE A DAY 180 capsule 3  . zonisamide (ZONEGRAN) 25 MG capsule TAKE 2 CAPSULES AT BEDTIME 180 capsule 1   Facility-Administered Medications Prior to Visit  Medication Dose Route Frequency Provider Last Rate Last Dose  . albuterol (PROVENTIL) (2.5 MG/3ML) 0.083% nebulizer solution 2.5 mg  2.5 mg Nebulization Once Dunn, Ryan M, PA-C      . ipratropium (ATROVENT) nebulizer solution 0.5 mg  0.5 mg Nebulization Once Rise Mu, PA-C        PAST MEDICAL HISTORY: Past Medical History:  Diagnosis Date  . Allergic rhinitis   . Asthma   . Asthma    inhaler prn  . Celiac disease   . Depression with anxiety   . Dysmenorrhea   . Gastroparesis   . GERD (gastroesophageal reflux disease)   . Hx of low back pain   . IBS (irritable bowel syndrome)   . Migraine   . Migraine without aura, with intractable migraine, so stated, without mention of status migrainosus 05/20/2013  . Osteopenia     PAST SURGICAL HISTORY: Past Surgical  History:  Procedure Laterality Date  . ABDOMINAL SURGERY    . BREAST BIOPSY Right    x2  . COLONOSCOPY    . FOOT SURGERY Left    Bunionectomy  . MASS EXCISION  08/08/2011   Procedure: MINOR EXCISION OF MASS;  Surgeon: Cammie Sickle., MD;  Location: [facility];  Service: Orthopedics;  Laterality: Left;  left ring excision cyst proximal phalangeal joint  . NISSEN FUNDOPLICATION    . ROTATOR CUFF REPAIR Right 05/2015  . TONSILLECTOMY      FAMILY HISTORY: Family History  Problem Relation Age of Onset  . Stroke Father   . Dementia Father   .  Atrial fibrillation Father   . Cancer Father   . Hypertension Mother   . Scoliosis Mother   . Heart disease Sister        Congenital heart disease  . Migraines Neg Hx     SOCIAL HISTORY: Social History   Social History  . Marital status: Married    Spouse name: N/A  . Number of children: 0  . Years of education: college   Occupational History  .  [employer]   Social History Main Topics  . Smoking status: Former Research scientist (life sciences)  . Smokeless tobacco: Never Used     Comment: IN college 1 year  . Alcohol use 0.0 oz/week     Comment: wine occasionally  . Drug use: No  . Sexual activity: Yes    Birth control/ protection: Post-menopausal   Other Topics Concern  . Not on file   Social History Narrative   Patient lives at home with her husband Hoy Morn).   Patient is retired.   Education college.   Right handed.    Caffeine one cup daily.      PHYSICAL EXAM  Vitals:   08/23/16 1319  BP: 99/71  Pulse: 68  Weight: 152 lb 9.6 oz (69.2 kg)  Height: 5' 9.5" (1.765 m)   Body mass index is 22.21 kg/m.  Generalized: Well developed, in no acute distress   Neurological examination  Mentation: Alert oriented to time, place, history taking. Follows all commands speech and language fluent Cranial nerve II-XII: Pupils were equal round reactive to light. Extraocular movements were full, visual field  were full on confrontational test. Facial sensation and strength were normal. Uvula tongue midline. Head turning and shoulder shrug  were normal and symmetric. Motor: The motor testing reveals 5 over 5 strength of all 4 extremities. Good symmetric motor tone is noted throughout.  Sensory: Sensory testing is intact to soft touch on all 4 extremities. No evidence of extinction is noted.  Coordination: Cerebellar testing reveals good finger-nose-finger and heel-to-shin bilaterally.  Gait and station: Gait is normal. Tandem gait is normal. Romberg is negative. No drift is seen.  Reflexes: Deep tendon reflexes are symmetric and normal bilaterally.   DIAGNOSTIC DATA (LABS, IMAGING, TESTING) - I reviewed patient records, labs, notes, testing and imaging myself where available.  Lab Results  Component Value Date   WBC 6.2 11/30/2015   HGB 13.0 11/30/2015   HCT 37.9 11/30/2015   MCV 90.7 11/30/2015   PLT 195 11/30/2015      Component Value Date/Time   NA 141 11/30/2015 1745   K 3.7 11/30/2015 1745   CL 110 11/30/2015 1745   CO2 25 11/30/2015 1745   GLUCOSE 99 11/30/2015 1745   BUN 9 11/30/2015 1745   CREATININE 0.97 11/30/2015 1745   CALCIUM 8.8 (L) 11/30/2015 1745   PROT 6.5 11/30/2015 1745   ALBUMIN 3.8 11/30/2015 1745   AST 24 11/30/2015 1745   ALT 26 11/30/2015 1745   ALKPHOS 52 11/30/2015 1745   BILITOT 0.3 11/30/2015 1745   GFRNONAA >60 11/30/2015 1745   GFRAA >60 11/30/2015 1745   No results found for: CHOL, HDL, LDLCALC, LDLDIRECT, TRIG, CHOLHDL No results found for: HGBA1C No results found for: VITAMINB12 No results found for: TSH    ASSESSMENT AND PLAN 66 y.o. year old female  has a past medical history of Allergic rhinitis; Asthma; Asthma; Celiac disease; Depression with anxiety; Dysmenorrhea; Gastroparesis; GERD (gastroesophageal reflux disease); low back pain; IBS (irritable  bowel syndrome); Migraine; Migraine without aura, with intractable migraine, so stated,  without mention of status migrainosus (05/20/2013); and Osteopenia. here with:  1. Migraine headaches  Overall the patient has remained stable. She will continue Zonegran 100 mg in the morning and 150 mg in the evening. She is advised that she should take Maxalt at the onset of her migraine and repeat in 2 hours if needed. If she has a prolonged headache that does not respond to Maxalt she should let us know  And we can consider a prednisone Dosepak or IV infusion. Patient voiced understanding. She does plan to take more time destress in order to prevent her migraines. She will return in 6 months or sooner if needed.  I spent 15 minutes with the patient 50% of this time was spent discussing her medication.    Ward Givens, MSN, NP-C 08/23/2016, 1:23 PM [facility] [address] [place], [place] [address] [phone]

## 2016-09-15 ENCOUNTER — Telehealth: Payer: Self-pay | Admitting: Adult Health

## 2016-09-15 MED ORDER — PREDNISONE 5 MG PO TABS: ORAL_TABLET | ORAL | 0 refills | Status: DC

## 2016-09-15 NOTE — Telephone Encounter (Signed)
Patient called and stated she is having a bad migraine and she would like to know if someone can call in an rx for prednisone for her. Please call and advise.

## 2016-09-15 NOTE — Telephone Encounter (Signed)
I called to inform the patient that we had sent over the rx for prednisone.

## 2016-11-09 ENCOUNTER — Emergency Department (HOSPITAL_COMMUNITY): Payer: BLUE CROSS/BLUE SHIELD

## 2016-11-09 ENCOUNTER — Emergency Department (HOSPITAL_COMMUNITY)
Admission: EM | Admit: 2016-11-09 | Discharge: 2016-11-09 | Disposition: A | Discharge status: Home / Self Care | Payer: BLUE CROSS/BLUE SHIELD | Attending: Emergency Medicine | Admitting: Emergency Medicine

## 2016-11-09 ENCOUNTER — Encounter (HOSPITAL_COMMUNITY): Payer: Self-pay

## 2016-11-09 DIAGNOSIS — R109 Unspecified abdominal pain: Secondary | ICD-10-CM

## 2016-11-09 DIAGNOSIS — Z79899 Other long term (current) drug therapy: Secondary | ICD-10-CM | POA: Diagnosis not present

## 2016-11-09 DIAGNOSIS — R1011 Right upper quadrant pain: Secondary | ICD-10-CM | POA: Diagnosis not present

## 2016-11-09 DIAGNOSIS — Z87891 Personal history of nicotine dependence: Secondary | ICD-10-CM | POA: Insufficient documentation

## 2016-11-09 DIAGNOSIS — J45909 Unspecified asthma, uncomplicated: Secondary | ICD-10-CM | POA: Insufficient documentation

## 2016-11-09 HISTORY — DX: Periapical abscess without sinus: K04.7

## 2016-11-09 LAB — URINALYSIS, ROUTINE W REFLEX MICROSCOPIC
BACTERIA UA: NONE SEEN
Bilirubin Urine: NEGATIVE
Glucose, UA: NEGATIVE mg/dL
Hgb urine dipstick: NEGATIVE
KETONES UR: 80 mg/dL — AB
NITRITE: NEGATIVE
PH: 5 (ref 5.0–8.0)
Protein, ur: 30 mg/dL — AB
SPECIFIC GRAVITY, URINE: 1.024 (ref 1.005–1.030)

## 2016-11-09 LAB — COMPREHENSIVE METABOLIC PANEL
ALBUMIN: 4.3 g/dL (ref 3.5–5.0)
ALT: 26 U/L (ref 14–54)
AST: 35 U/L (ref 15–41)
Alkaline Phosphatase: 83 U/L (ref 38–126)
Anion gap: 16 — ABNORMAL HIGH (ref 5–15)
BILIRUBIN TOTAL: 1.1 mg/dL (ref 0.3–1.2)
BUN: 18 mg/dL (ref 6–20)
CALCIUM: 9.8 mg/dL (ref 8.9–10.3)
CO2: 18 mmol/L — ABNORMAL LOW (ref 22–32)
CREATININE: 1.07 mg/dL — AB (ref 0.44–1.00)
Chloride: 106 mmol/L (ref 101–111)
GFR calc Af Amer: >60 mL/min (ref >60)
GFR calc non Af Amer: 54 mL/min — ABNORMAL LOW (ref >60)
GLUCOSE: 72 mg/dL (ref 65–99)
Potassium: 4.6 mmol/L (ref 3.5–5.1)
Sodium: 140 mmol/L (ref 135–145)
TOTAL PROTEIN: 7.2 g/dL (ref 6.5–8.1)

## 2016-11-09 LAB — CBC
HEMATOCRIT: 44.1 % (ref 36.0–46.0)
Hemoglobin: 15 g/dL (ref 12.0–15.0)
MCH: 30.7 pg (ref 26.0–34.0)
MCHC: 34 g/dL (ref 30.0–36.0)
MCV: 90.4 fL (ref 78.0–100.0)
PLATELETS: 294 10*3/uL (ref 150–400)
RBC: 4.88 MIL/uL (ref 3.87–5.11)
RDW: 13.3 % (ref 11.5–15.5)
WBC: 12.9 10*3/uL — ABNORMAL HIGH (ref 4.0–10.5)

## 2016-11-09 LAB — LIPASE, BLOOD: Lipase: 23 U/L (ref 11–51)

## 2016-11-09 MED ORDER — FLUCONAZOLE 200 MG PO TABS: 200.0000 mg | ORAL_TABLET | Freq: Once | ORAL | 0 refills | Status: AC

## 2016-11-09 MED ORDER — DICYCLOMINE HCL 10 MG/ML IM SOLN
20.0000 mg | Freq: Once | INTRAMUSCULAR | Status: AC
  Administered 2016-11-09: 20 mg via INTRAMUSCULAR
  Filled 2016-11-09: qty 2

## 2016-11-09 MED ORDER — FLUCONAZOLE 100 MG PO TABS
150.0000 mg | ORAL_TABLET | Freq: Every day | ORAL | Status: DC
  Administered 2016-11-09: 150 mg via ORAL
  Filled 2016-11-09: qty 2

## 2016-11-09 MED ORDER — SUCRALFATE 1 GM/10ML PO SUSP: 1.0000 g | Freq: Three times a day (TID) | ORAL | 0 refills | Status: DC

## 2016-11-09 MED ORDER — GI COCKTAIL ~~LOC~~
30.0000 mL | Freq: Once | ORAL | Status: AC
  Administered 2016-11-09: 30 mL via ORAL
  Filled 2016-11-09: qty 30

## 2016-11-09 MED ORDER — SODIUM CHLORIDE 0.9 % IV BOLUS (SEPSIS)
1000.0000 mL | Freq: Once | INTRAVENOUS | Status: AC
  Administered 2016-11-09: 1000 mL via INTRAVENOUS

## 2016-11-09 NOTE — ED Provider Notes (Signed)
PROGRESS NOTE                                                                                                                 This is a sign-out from [place] at shift change: Katelyn Mcclain is a 66 y.o. female presenting with recurrent right upper quadrant pain with poor oral intake. She's been worked up multiple times for this in the past. GI is Dr. Collene Mares. She has a second opinion with surgery in October. She is not vomiting today. She denies any fever or significant change in her chronic pain. She is tender in the right upper quadrant. Plan is to obtain a right upper quadrant ultrasound. Please refer to previous note for full HPI, ROS, PMH and PE. Blood work reassuring with no significant electrolyte abnormality. Bicarbonate is low at 18, she also has greater than 80 ketones in the urine. Likely dehydration. Patient is being aggressively hydrated. Mild leukocytosis.  Ultrasound reassuring. Extensive discussion about the findings with patient and her husband. It appears that there is a multidisciplinary team for reevaluation scheduled at [facility] in October. Also recommended she be seen by her primary care physician, Dr. Ninfa Meeker. She takes Bentyl at home, she also has a prescription for Zofran and Reglan. She's been hesitant to take that because she feels it may mask any gallbladder symptoms. This patient is day hydrated and I advised her that it is very important that she take her nausea medication and obtain her hydration. She verbalized understanding back technique. She is also reporting a yeast infection after having antibiotics for dental surgery several weeks ago. She is not reporting any pelvic pain or severe vaginal discharge, she's been using Monistat at home for the last few days with no relief. Mild inflammation. She'll be given fluconazole in the ED, I will write her a prescription she needs a second dose in 7-10 days. She understands take this medication when she goes home tonight. She states that  she's had EGDs in the past and she doesn't have any ulcerations or H pylori however, in an attempt to help control her pain I have offered her a prescription for Carafate which I think may be helpful for her. She declines any narcotic pain medications at this time. Repeat abdominal exam is benign and nonsurgical.  Vitals:   11/09/16 1315 11/09/16 1330 11/09/16 1400 11/09/16 1523  BP:  118/72 112/79 125/71  Pulse: 74 71 69 76  Resp:    20  Temp:      TempSrc:      SpO2: 97% 96% 97% 100%  Weight:      Height:        Medications  dicyclomine (BENTYL) injection 20 mg (not administered)  gi cocktail (Maalox,Lidocaine,Donnatal) (not administered)  fluconazole (DIFLUCAN) tablet 150 mg (not administered)  sodium chloride 0.9 % bolus 1,000 mL (0 mLs Intravenous Stopped 11/09/16 1528)   Evaluation does not show pathology that would require ongoing emergent intervention or inpatient treatment. Pt is hemodynamically stable and mentating appropriately. Discussed  findings and plan with patient/guardian, who agrees with care plan. All questions answered. Return precautions discussed and outpatient follow up given.         Monico Blitz, Hershal Coria 11/09/16 1757    Veryl Speak, MD 11/11/16 [phone]

## 2016-11-09 NOTE — ED Provider Notes (Signed)
[facility] DEPT Provider Note   CSN: [id number] Arrival date & time: 11/09/16  1117     History   Chief Complaint Chief Complaint  Patient presents with  . Abdominal Pain    HPI Katelyn Mcclain is a 66 y.o. female.  Patient presents with complaint of recurrent right upper quadrant and right flank pain. Patient has had multiple episodes of similar symptoms over the past year. Symptoms today have been more severe than usual. Pain does not radiate. She has associated nausea but no vomiting. She has recently been on antibiotics for a dental abscess which she feels made her pain worse. Patient has been evaluated with CT scan of the abdomen, ultrasound of the gallbladder, HIDA scan. She states that she has seen a surgeon who do not feel that she needed her gallbladder to come out and that she is seeking a second opinion. She was going to see her GI physician today but came to the emergency department because of pain. No fevers, diarrhea, urinary symptoms.      Past Medical History:  Diagnosis Date  . Abscessed tooth   . Allergic rhinitis   . Asthma   . Asthma    inhaler prn  . Celiac disease   . Depression with anxiety   . Dysmenorrhea   . Gastroparesis   . GERD (gastroesophageal reflux disease)   . Hx of low back pain   . IBS (irritable bowel syndrome)   . Migraine   . Migraine without aura, with intractable migraine, so stated, without mention of status migrainosus 05/20/2013  . Osteopenia     Patient Active Problem List   Diagnosis Date Noted  . Chest pain 06/15/2013  . Intractable migraine without aura 05/20/2013    Past Surgical History:  Procedure Laterality Date  . ABDOMINAL SURGERY    . BREAST BIOPSY Right    x2  . COLONOSCOPY    . FOOT SURGERY Left    Bunionectomy  . MASS EXCISION  08/08/2011   Procedure: MINOR EXCISION OF MASS;  Surgeon: Cammie Sickle., MD;  Location: [facility];  Service: Orthopedics;  Laterality: Left;  left ring  excision cyst proximal phalangeal joint  . NISSEN FUNDOPLICATION    . ROTATOR CUFF REPAIR Right 05/2015  . TONSILLECTOMY      OB History    No data available       Home Medications    Prior to Admission medications   Medication Sig Start Date End Date Taking? Authorizing Provider  albuterol (PROVENTIL HFA;VENTOLIN HFA) 108 (90 BASE) MCG/ACT inhaler Inhale 1 puff into the lungs every 6 (six) hours as needed for wheezing or shortness of breath.    [provider]  Calcium Carb-Cholecalciferol 600-100 MG-UNIT CAPS Take by mouth.    [provider]  Coenzyme Q10 (COQ10) 100 MG CAPS Take 1 tablet by mouth daily.    [provider]  dicyclomine (BENTYL) 20 MG tablet Take 20 mg by mouth 2 (two) times daily. 06/26/16   [provider]  Eszopiclone (ESZOPICLONE) 3 MG TABS Take 3 mg by mouth at bedtime. Take immediately before bedtime    [provider]  fish oil-omega-3 fatty acids 1000 MG capsule Take 2 g by mouth daily.      [provider]  folic acid (FOLVITE) 683 MCG tablet Take 800 mcg by mouth daily.     [provider]  Garlic (GARLIQUE PO) Take by mouth daily.    [provider]  Glucosamine-Chondroitin (OSTEO BI-FLEX REGULAR STRENGTH PO) Take 2 tablets by mouth daily.    [provider]  lansoprazole (PREVACID) 15 MG capsule Take 15 mg by mouth daily at 12 noon.    [provider]  magnesium 30 MG tablet Take 30 mg by mouth daily.    [provider]  Misc Natural Products (OSTEO BI-FLEX ADV JOINT SHIELD PO) Take 2 tablets by mouth daily.     [provider]  montelukast (SINGULAIR) 10 MG tablet Take 10 mg by mouth at bedtime.  11/03/13   [provider]  Multiple Vitamins-Minerals (PRESERVISION AREDS 2) CAPS Take 1 capsule by mouth daily.     [provider]  predniSONE (DELTASONE) 5 MG tablet Begin taking 6 tablets daily, taper by one tablet daily until off the  medication. 09/15/16   Ward Givens, NP  Probiotic Product (PROBIOTIC DAILY PO) Take 1 tablet by mouth daily.    [provider]  promethazine (PHENERGAN) 25 MG tablet Take 1 tablet (25 mg total) by mouth every 6 (six) hours as needed for nausea or vomiting. 11/30/15   Lawyer, Harrell Gave, PA-C  rizatriptan (MAXALT-MLT) 10 MG disintegrating tablet Take 1 tablet PO at the onset of migraine and May repeat in 2 hours if needed. Not to exceed 2 tabs/24 hours. 08/23/16   Ward Givens, NP  UNABLE TO FIND IB GARD and FD GARD   I cap twice daily  (peppermint oil)    [provider]  Vortioxetine HBr (BRINTELLIX) 20 MG TABS Take 20 mg by mouth at bedtime.    [provider]  zonisamide (ZONEGRAN) 100 MG capsule TAKE 1 CAPSULE TWICE A DAY 08/11/16   Kathrynn Ducking, MD  zonisamide [facility]) 25 MG capsule TAKE 2 CAPSULES AT BEDTIME 06/06/16   Kathrynn Ducking, MD    Family History Family History  Problem Relation Age of Onset  . Stroke Father   . Dementia Father   . Atrial fibrillation Father   . Cancer Father   . Hypertension Mother   . Scoliosis Mother   . Heart disease Sister        Congenital heart disease  . Migraines Neg Hx     Social History Social History  Substance Use Topics  . Smoking status: Former Research scientist (life sciences)  . Smokeless tobacco: Never Used     Comment: IN college 1 year  . Alcohol use 0.0 oz/week     Comment: wine occasionally     Allergies   Adhesive [tape]; Morphine and related; Lactose intolerance (gi); and Other   Review of Systems Review of Systems  Constitutional: Negative for fever.  HENT: Negative for rhinorrhea and sore throat.   Eyes: Negative for redness.  Respiratory: Negative for cough and shortness of breath.   Cardiovascular: Negative for chest pain.  Gastrointestinal: Positive for abdominal pain and nausea. Negative for diarrhea and vomiting.  Genitourinary: Negative for dysuria.  Musculoskeletal: Negative for myalgias.    Skin: Negative for rash.  Neurological: Negative for headaches.     Physical Exam Updated Vital Signs BP (!) 116/98 (BP Location: Right Arm)   Pulse 74   Temp 97.7 F (36.5 C) (Oral)   Resp 16   Ht 5' 10.5" (1.791 m)   Wt 68 kg (150 lb)   SpO2 98%   BMI 21.22 kg/m   Physical Exam  Constitutional: She appears well-developed and well-nourished. She appears distressed.  HENT:  Head: Normocephalic and atraumatic.  Mouth/Throat: Uvula is midline and oropharynx is  clear and moist. Mucous membranes are not pale and dry.  Eyes: Conjunctivae are normal. Right eye exhibits no discharge. Left eye exhibits no discharge.  Neck: Normal range of motion. Neck supple.  Cardiovascular: Normal rate, regular rhythm and normal heart sounds.   No murmur heard. Pulmonary/Chest: Effort normal and breath sounds normal. No respiratory distress. She has no wheezes. She has no rales.  Abdominal: Soft. There is tenderness. There is no rebound and no guarding.  Mild right upper quadrant tenderness.  Neurological: She is alert.  Skin: Skin is warm and dry. No pallor.  Psychiatric: She has a normal mood and affect.  Nursing note and vitals reviewed.    ED Treatments / Results  Labs (all labs ordered are listed, but only abnormal results are displayed) Labs Reviewed  COMPREHENSIVE METABOLIC PANEL - Abnormal; Notable for the following:       Result Value   CO2 18 (*)    Creatinine, Ser 1.07 (*)    GFR calc non Af Amer 54 (*)    Anion gap 16 (*)    All other components within normal limits  CBC - Abnormal; Notable for the following:    WBC 12.9 (*)    All other components within normal limits  URINALYSIS, ROUTINE W REFLEX MICROSCOPIC - Abnormal; Notable for the following:    Color, Urine AMBER (*)    APPearance HAZY (*)    Ketones, ur 80 (*)    Protein, ur 30 (*)    Leukocytes, UA TRACE (*)    Squamous Epithelial / LPF 0-5 (*)    All other components within normal limits  LIPASE, BLOOD     EKG  EKG Interpretation None       Radiology No results found.  Procedures Procedures (including critical care time)  Medications Ordered in ED Medications  sodium chloride 0.9 % bolus 1,000 mL (0 mLs Intravenous Stopped 11/09/16 1528)     Initial Impression / Assessment and Plan / ED Course  I have reviewed the triage vital signs and the nursing notes.  Pertinent labs & imaging results that were available during my care of the patient were reviewed by me and considered in my medical decision making (see chart for details).     Patient seen and examined. Work-up initiated. Fluids ordered, patient declines pain medication at this time. Will obtain RUQ ultrasound.   Vital signs reviewed and are as follows: BP (!) 116/98 (BP Location: Right Arm)   Pulse 74   Temp 97.7 F (36.5 C) (Oral)   Resp 16   Ht 5' 10.5" (1.791 m)   Wt 68 kg (150 lb)   SpO2 98%   BMI 21.22 kg/m   4:18 PM Patient seen by Dr. Stark Jock. Agrees with plan.   Handoff to [place] PA-C at shift change.   F/u on [place] results. Treat sx. Home with GI f/u if neg.   Final Clinical Impressions(s) / ED Diagnoses   Final diagnoses:  RUQ pain   Pending completion of evaluation.   New Prescriptions New Prescriptions   No medications on file     Carlisle Cater, Hershal Coria 11/09/16 1633    Veryl Speak, MD 11/11/16 [phone]

## 2016-11-09 NOTE — Discharge Instructions (Signed)
Please follow with your primary care doctor in the next 2 days for a check-up. They must obtain records for further management.  ° °Do not hesitate to return to the Emergency Department for any new, worsening or concerning symptoms.  ° °

## 2016-11-09 NOTE — ED Triage Notes (Signed)
Per Pt, Pt is coming from home with complaints of RUQ pain that has been recurrent over the last year, but reports becoming worse since yesterday. Pt reports having an abscessed tooth recently and being placed on antibiotics. Pt reports antibiotics making her nauseated and being unable to eat anything. Pt was to see MD Collene Mares, but the pain increased and patient came here.

## 2016-11-09 NOTE — ED Notes (Signed)
Patient transported to Ultrasound 

## 2016-12-08 ENCOUNTER — Encounter: Payer: Self-pay | Admitting: Internal Medicine

## 2016-12-12 ENCOUNTER — Other Ambulatory Visit (HOSPITAL_COMMUNITY): Payer: Self-pay | Admitting: Internal Medicine

## 2016-12-12 DIAGNOSIS — R1011 Right upper quadrant pain: Secondary | ICD-10-CM

## 2016-12-12 DIAGNOSIS — R1013 Epigastric pain: Secondary | ICD-10-CM

## 2016-12-12 DIAGNOSIS — N898 Other specified noninflammatory disorders of vagina: Secondary | ICD-10-CM | POA: Insufficient documentation

## 2016-12-13 ENCOUNTER — Ambulatory Visit (HOSPITAL_COMMUNITY)
Admission: RE | Admit: 2016-12-13 | Discharge: 2016-12-13 | Disposition: A | Discharge status: Home / Self Care | Payer: BLUE CROSS/BLUE SHIELD | Source: Ambulatory Visit | Attending: Internal Medicine | Admitting: Internal Medicine

## 2016-12-13 DIAGNOSIS — R1011 Right upper quadrant pain: Secondary | ICD-10-CM | POA: Diagnosis present

## 2016-12-13 DIAGNOSIS — K449 Diaphragmatic hernia without obstruction or gangrene: Secondary | ICD-10-CM | POA: Diagnosis not present

## 2016-12-13 DIAGNOSIS — D1803 Hemangioma of intra-abdominal structures: Secondary | ICD-10-CM | POA: Insufficient documentation

## 2016-12-13 DIAGNOSIS — I700 Atherosclerosis of aorta: Secondary | ICD-10-CM | POA: Diagnosis not present

## 2016-12-13 DIAGNOSIS — R1013 Epigastric pain: Secondary | ICD-10-CM

## 2016-12-13 MED ORDER — GADOBENATE DIMEGLUMINE 529 MG/ML IV SOLN
15.0000 mL | Freq: Once | INTRAVENOUS | Status: AC | PRN
  Administered 2016-12-13: 15 mL via INTRAVENOUS

## 2016-12-14 DIAGNOSIS — N952 Postmenopausal atrophic vaginitis: Secondary | ICD-10-CM | POA: Insufficient documentation

## 2016-12-14 DIAGNOSIS — R5383 Other fatigue: Secondary | ICD-10-CM | POA: Insufficient documentation

## 2017-01-08 ENCOUNTER — Other Ambulatory Visit: Payer: Self-pay | Admitting: Adult Health

## 2017-01-08 ENCOUNTER — Telehealth: Payer: Self-pay | Admitting: Neurology

## 2017-01-08 MED ORDER — DEXAMETHASONE 2 MG PO TABS: ORAL_TABLET | ORAL | 0 refills | Status: DC

## 2017-01-08 NOTE — Addendum Note (Signed)
Addended by: Kathrynn Ducking on: 01/08/2017 10:04 AM   Modules accepted: Orders

## 2017-01-08 NOTE — Telephone Encounter (Signed)
I called patient. Patient has been under some stress with illness with her husband. The headaches have converted to been daily over the last 3 days, I will send in a prescription for a three-day taper of Decadron.  The patient will contact me if the headaches do not abate.

## 2017-01-08 NOTE — Telephone Encounter (Signed)
She complains of frequent headaches for a few days now, wants a steroid pack,  I have told her office will call her back Monday January 08 2017 to check on her headache, to see if she still needs steroid package,

## 2017-01-11 ENCOUNTER — Telehealth: Payer: Self-pay | Admitting: Neurology

## 2017-01-11 MED ORDER — RIZATRIPTAN BENZOATE 10 MG PO TBDP: ORAL_TABLET | ORAL | 6 refills | Status: DC

## 2017-01-11 NOTE — Addendum Note (Signed)
Addended by: Florian Buff C on: 01/11/2017 03:05 PM   Modules accepted: Orders

## 2017-01-11 NOTE — Telephone Encounter (Signed)
Pt calling re: the rizatriptan (MAXALT-MLT) 10 MG disintegrating tablet,  Pt states  [facility], [place] - [address] [phone] (Phone) [phone] (Fax)     they need a new Prescription sent to them

## 2017-01-19 ENCOUNTER — Other Ambulatory Visit: Payer: Self-pay | Admitting: Internal Medicine

## 2017-01-19 DIAGNOSIS — Z1231 Encounter for screening mammogram for malignant neoplasm of breast: Secondary | ICD-10-CM

## 2017-02-16 DIAGNOSIS — K6389 Other specified diseases of intestine: Secondary | ICD-10-CM | POA: Diagnosis not present

## 2017-02-16 DIAGNOSIS — A049 Bacterial intestinal infection, unspecified: Secondary | ICD-10-CM | POA: Diagnosis not present

## 2017-02-26 ENCOUNTER — Telehealth: Payer: Self-pay | Admitting: Adult Health

## 2017-02-26 ENCOUNTER — Ambulatory Visit: Payer: BLUE CROSS/BLUE SHIELD

## 2017-02-26 ENCOUNTER — Ambulatory Visit: Payer: BLUE CROSS/BLUE SHIELD | Admitting: Adult Health

## 2017-02-26 MED ORDER — ZONISAMIDE 25 MG PO CAPS: ORAL_CAPSULE | ORAL | 3 refills | Status: DC

## 2017-02-26 NOTE — Addendum Note (Signed)
Addended by: Minna Antis on: 02/26/2017 09:46 AM   Modules accepted: Orders

## 2017-02-26 NOTE — Addendum Note (Signed)
Addended by: Trudie Buckler on: 02/26/2017 10:12 AM   Modules accepted: Orders

## 2017-02-26 NOTE — Telephone Encounter (Signed)
Pt states previous prescriptions of zonisamide (ZONEGRAN) 25 MG capsule pt has taken 2 at night, this last prescription is for 1 .  Pt is asking for a new prescription the will allow her to continue to take 2 at night .  Please call pt on home# and leave message if not available re: her being able to get a new prescription to take 2 pills at night

## 2017-03-14 DIAGNOSIS — H01021 Squamous blepharitis right upper eyelid: Secondary | ICD-10-CM | POA: Diagnosis not present

## 2017-03-14 DIAGNOSIS — H2513 Age-related nuclear cataract, bilateral: Secondary | ICD-10-CM | POA: Diagnosis not present

## 2017-03-14 DIAGNOSIS — H01025 Squamous blepharitis left lower eyelid: Secondary | ICD-10-CM | POA: Diagnosis not present

## 2017-03-14 DIAGNOSIS — H01024 Squamous blepharitis left upper eyelid: Secondary | ICD-10-CM | POA: Diagnosis not present

## 2017-03-14 DIAGNOSIS — H01022 Squamous blepharitis right lower eyelid: Secondary | ICD-10-CM | POA: Diagnosis not present

## 2017-03-19 ENCOUNTER — Ambulatory Visit: Payer: Self-pay | Admitting: Adult Health

## 2017-03-21 DIAGNOSIS — N3010 Interstitial cystitis (chronic) without hematuria: Secondary | ICD-10-CM | POA: Diagnosis not present

## 2017-03-23 ENCOUNTER — Ambulatory Visit: Payer: Self-pay | Admitting: Adult Health

## 2017-03-27 DIAGNOSIS — N3010 Interstitial cystitis (chronic) without hematuria: Secondary | ICD-10-CM | POA: Diagnosis not present

## 2017-04-13 DIAGNOSIS — J45901 Unspecified asthma with (acute) exacerbation: Secondary | ICD-10-CM | POA: Diagnosis not present

## 2017-04-13 DIAGNOSIS — J069 Acute upper respiratory infection, unspecified: Secondary | ICD-10-CM | POA: Diagnosis not present

## 2017-04-16 ENCOUNTER — Ambulatory Visit: Payer: BLUE CROSS/BLUE SHIELD

## 2017-05-04 ENCOUNTER — Ambulatory Visit
Admission: RE | Admit: 2017-05-04 | Discharge: 2017-05-04 | Disposition: A | Discharge status: Home / Self Care | Payer: Medicare Other | Source: Ambulatory Visit | Attending: Internal Medicine | Admitting: Internal Medicine

## 2017-05-04 DIAGNOSIS — Z1231 Encounter for screening mammogram for malignant neoplasm of breast: Secondary | ICD-10-CM

## 2017-05-08 ENCOUNTER — Telehealth: Payer: Self-pay | Admitting: Adult Health

## 2017-05-08 NOTE — Telephone Encounter (Signed)
error 

## 2017-05-09 DIAGNOSIS — K6389 Other specified diseases of intestine: Secondary | ICD-10-CM | POA: Diagnosis not present

## 2017-05-09 DIAGNOSIS — A049 Bacterial intestinal infection, unspecified: Secondary | ICD-10-CM | POA: Diagnosis not present

## 2017-06-07 ENCOUNTER — Other Ambulatory Visit: Payer: Self-pay | Admitting: *Deleted

## 2017-06-07 MED ORDER — ZONISAMIDE 25 MG PO CAPS: ORAL_CAPSULE | ORAL | 3 refills | Status: DC

## 2017-06-07 NOTE — Telephone Encounter (Signed)
Spoke to pt and relayed that [facility]  She said yes that is where she get her medications as they are cheaper.

## 2017-06-25 DIAGNOSIS — K6389 Other specified diseases of intestine: Secondary | ICD-10-CM | POA: Diagnosis not present

## 2017-06-25 DIAGNOSIS — A049 Bacterial intestinal infection, unspecified: Secondary | ICD-10-CM | POA: Diagnosis not present

## 2017-06-25 DIAGNOSIS — R140 Abdominal distension (gaseous): Secondary | ICD-10-CM | POA: Diagnosis not present

## 2017-06-25 DIAGNOSIS — K900 Celiac disease: Secondary | ICD-10-CM | POA: Diagnosis not present

## 2017-07-23 ENCOUNTER — Ambulatory Visit (INDEPENDENT_AMBULATORY_CARE_PROVIDER_SITE_OTHER): Payer: Medicare Other | Admitting: Adult Health

## 2017-07-23 ENCOUNTER — Encounter: Payer: Self-pay | Admitting: Adult Health

## 2017-07-23 VITALS — BP 96/67 | HR 69 | Ht 70.0 in | Wt 144.0 lb

## 2017-07-23 DIAGNOSIS — G43009 Migraine without aura, not intractable, without status migrainosus: Secondary | ICD-10-CM | POA: Diagnosis not present

## 2017-07-23 DIAGNOSIS — E7800 Pure hypercholesterolemia, unspecified: Secondary | ICD-10-CM | POA: Diagnosis not present

## 2017-07-23 MED ORDER — ZONISAMIDE 25 MG PO CAPS: ORAL_CAPSULE | ORAL | 3 refills | Status: DC

## 2017-07-23 MED ORDER — ZONISAMIDE 100 MG PO CAPS: 100.0000 mg | ORAL_CAPSULE | Freq: Two times a day (BID) | ORAL | 3 refills | Status: DC

## 2017-07-23 NOTE — Progress Notes (Signed)
I have read the note, and I agree with the clinical assessment and plan.  Katelyn Mcclain   

## 2017-07-23 NOTE — Progress Notes (Signed)
PATIENT: Katelyn Mcclain DOB: June 23, 1950  REASON FOR VISIT: follow up HISTORY FROM: patient  HISTORY OF PRESENT ILLNESS: Today 07/23/17 Katelyn Mcclain is a 67 year old female with a history of migraine headaches.  She returns today for follow-up.  She continues on 100 mg in the morning and 150 mg at bedtime.  She reports that her headaches are under pretty good control.  She has approximately 1 headache a month.  She states that her migraines tend to be related to weather changes and allergies.  She uses Maxalt with good benefit.  Reports that she has been under a lot of stress recently as her dad passed away and her mom is in a nursing home.  She states that she is also been diagnosed with a chronic bacterial infection in the small GI.  She has been on several different antibiotics.  She is currently on Cipro which she states makes her feel very fatigued.  She returns today for an evaluation.  HISTORY Katelyn Mcclain is a 67 year old female with a history of migraine headaches. She returns today for follow-up. She continues on Zonegran 100 mg in the morning and 150 mg at bedtime. She states that her #1 trigger for her headaches is stress related to her parents health. She reports that her dad is now on hospice care and her mother is also in a nursing home. She states that her headaches normally occur when her dad has a health issue. She states in the past month she has had 7 headaches. Her headaches typically respond to Maxalt but not always. She does state that she spent a weekend camping and was headache free. She does state that sometimes she tries to "ride the headache out" before taking Maxalt. She returns today for an evaluation.   REVIEW OF SYSTEMS: Out of a complete 14 system review of symptoms, the patient complains only of the following symptoms, and all other reviewed systems are negative.  See HPI  ALLERGIES: Allergies  Allergen Reactions  . Adhesive [Tape] Other (See Comments)   Tear's skin off   . Morphine And Related Other (See Comments)    Sensitive to narcotics, worsening migraines.  . Clonazepam Other (See Comments)    Caused anxiety  . Lactose Intolerance (Gi) Diarrhea and Nausea And Vomiting  . Linaclotide Other (See Comments)    Caused spastic bladder (reaction to Linzess)  . Other     Pt does not use artificial sweeteners     HOME MEDICATIONS: Outpatient Medications Prior to Visit  Medication Sig Dispense Refill  . acetaminophen (TYLENOL) 500 MG tablet Take 500 mg by mouth every 6 (six) hours as needed (pain).    Marland Kitchen albuterol (PROVENTIL HFA;VENTOLIN HFA) 108 (90 BASE) MCG/ACT inhaler Inhale 1 puff into the lungs every 6 (six) hours as needed for wheezing or shortness of breath (seasonal allergies).     Marland Kitchen b complex vitamins tablet Take 1 tablet by mouth daily.    . Calcium-Magnesium-Vitamin D (CALCIUM 1200+D3 PO) Take 1,200 mg by mouth daily.    . cycloSPORINE (RESTASIS) 0.05 % ophthalmic emulsion 1 drop.    . dicyclomine (BENTYL) 20 MG tablet Take 20 mg by mouth 2 (two) times daily as needed for spasms (stomach pain).   6  . Estrogens, Conjugated (PREMARIN VA) Place vaginally.    . Eszopiclone (ESZOPICLONE) 3 MG TABS Take 1.5 mg by mouth at bedtime as needed (sleep). Take immediately before bedtime     . Garlic (GARLIQUE PO) Take 1 capsule  by mouth daily.     . Miconazole Nitrate (MONISTAT 7 VA) Place 1 application vaginally at bedtime as needed (yeast infection).    . montelukast (SINGULAIR) 10 MG tablet Take 10 mg by mouth at bedtime.     . Multiple Vitamins-Minerals (PRESERVISION AREDS 2) CAPS Take 1 capsule by mouth daily.     . Omega-3 Fatty Acids (OMEGA 3 PO) Take 1,280 mg by mouth daily.    Marland Kitchen omeprazole (PRILOSEC) 40 MG capsule Take 40 mg by mouth daily.    Marland Kitchen OVER THE COUNTER MEDICATION Take 1 capsule by mouth 2 (two) times daily. IBGard    . OVER THE COUNTER MEDICATION Take 1 capsule by mouth 2 (two) times daily. FDGard    . OVER THE COUNTER  MEDICATION Take 500 mg by mouth daily. Vitamin B5    . OVER THE COUNTER MEDICATION Take 500 mg by mouth daily. Berberine    . polyethylene glycol (MIRALAX / GLYCOLAX) packet Take 17 g by mouth at bedtime as needed (constipation). Mix in 8 oz liquid and drink     . Probiotic Product (PROBIOTIC DAILY PO) Take 1 tablet by mouth 2 (two) times daily.     . promethazine (PHENERGAN) 25 MG tablet Take 1 tablet (25 mg total) by mouth every 6 (six) hours as needed for nausea or vomiting. 10 tablet 0  . pyridoxine (B-6) 200 MG tablet Take 200 mg by mouth daily.    . rizatriptan (MAXALT-MLT) 10 MG disintegrating tablet Take 1 tablet PO at the onset of migraine and May repeat in 2 hours if needed. Not to exceed 2 tabs/24 hours. 9 tablet 6  . vitamin C (ASCORBIC ACID) 500 MG tablet Take 500 mg by mouth 2 (two) times daily.    Marland Kitchen vortioxetine HBr (TRINTELLIX) 20 MG TABS Take 20 mg by mouth at bedtime.    Marland Kitchen zonisamide (ZONEGRAN) 100 MG capsule TAKE 1 CAPSULE TWICE A DAY (Patient taking differently: TAKE 1 CAPSULE (100 MG) BY MOUTH TWICE A DAY (ADD #2 25 MG CAPSULES (50 MG) AT NIGHT)) 180 capsule 3  . zonisamide (ZONEGRAN) 25 MG capsule TAKE 2 CAPSULE EVERY EVENING WITH 100 MG CAPSULE TO MAKE 150 MG NIGHTLY DOSE. 180 capsule 3  . dexamethasone (DECADRON) 2 MG tablet Take 3 tablets the first day, 2 the second and 1 the third day 6 tablet 0   Facility-Administered Medications Prior to Visit  Medication Dose Route Frequency Provider Last Rate Last Dose  . albuterol (PROVENTIL) (2.5 MG/3ML) 0.083% nebulizer solution 2.5 mg  2.5 mg Nebulization Once Dunn, Ryan M, PA-C      . ipratropium (ATROVENT) nebulizer solution 0.5 mg  0.5 mg Nebulization Once Rise Mu, PA-C        PAST MEDICAL HISTORY: Past Medical History:  Diagnosis Date  . Abscessed tooth   . Allergic rhinitis   . Asthma   . Asthma    inhaler prn  . Celiac disease   . Depression with anxiety   . Dysmenorrhea   . Gastroparesis   . GERD  (gastroesophageal reflux disease)   . Hx of low back pain   . IBS (irritable bowel syndrome)   . Migraine   . Migraine without aura, with intractable migraine, so stated, without mention of status migrainosus 05/20/2013  . Osteopenia     PAST SURGICAL HISTORY: Past Surgical History:  Procedure Laterality Date  . ABDOMINAL SURGERY    . BREAST BIOPSY Right    x2  . BREAST CYST  ASPIRATION  1999  . COLONOSCOPY    . FOOT SURGERY Left    Bunionectomy  . MASS EXCISION  08/08/2011   Procedure: MINOR EXCISION OF MASS;  Surgeon: Cammie Sickle., MD;  Location: [facility];  Service: Orthopedics;  Laterality: Left;  left ring excision cyst proximal phalangeal joint  . NISSEN FUNDOPLICATION    . ROTATOR CUFF REPAIR Right 05/2015  . TONSILLECTOMY      FAMILY HISTORY: Family History  Problem Relation Age of Onset  . Stroke Father   . Dementia Father   . Atrial fibrillation Father   . Cancer Father   . Hypertension Mother   . Scoliosis Mother   . Heart disease Sister        Congenital heart disease  . Migraines Neg Hx     SOCIAL HISTORY: Social History   Socioeconomic History  . Marital status: Married    Spouse name: Not on file  . Number of children: 0  . Years of education: college  . Highest education level: Not on file  Occupational History    Employer: [employer]  . Financial resource strain: Not on file  . Food insecurity:    Worry: Not on file    Inability: Not on file  . Transportation needs:    Medical: Not on file    Non-medical: Not on file  Tobacco Use  . Smoking status: Former Research scientist (life sciences)  . Smokeless tobacco: Never Used  . Tobacco comment: IN college 1 year  Substance and Sexual Activity  . Alcohol use: Yes    Alcohol/week: 0.0 oz    Comment: wine occasionally  . Drug use: No  . Sexual activity: Yes    Birth control/protection: Post-menopausal  Lifestyle  . Physical activity:    Days per  week: Not on file    Minutes per session: Not on file  . Stress: Not on file  Relationships  . Social connections:    Talks on phone: Not on file    Gets together: Not on file    Attends religious service: Not on file    Active member of club or organization: Not on file    Attends meetings of clubs or organizations: Not on file    Relationship status: Not on file  . Intimate partner violence:    Fear of current or ex partner: Not on file    Emotionally abused: Not on file    Physically abused: Not on file    Forced sexual activity: Not on file  Other Topics Concern  . Not on file  Social History Narrative   Patient lives at home with her husband Hoy Morn).   Patient is retired.   Education college.   Right handed.    Caffeine one cup daily.      PHYSICAL EXAM  Vitals:   07/23/17 0818  BP: 96/67  Pulse: 69  Weight: 144 lb (65.3 kg)  Height: 5\' 10"  (1.778 m)   Body mass index is 20.66 kg/m.  Generalized: Well developed, in no acute distress   Neurological examination  Mentation: Alert oriented to time, place, history taking. Follows all commands speech and language fluent Cranial nerve II-XII: Pupils were equal round reactive to light. Extraocular movements were full, visual field were full on confrontational test. Facial sensation and strength were normal. Uvula tongue midline. Head turning and shoulder shrug  were normal and symmetric. Motor: The motor testing reveals  5 over 5 strength of all 4 extremities. Good symmetric motor tone is noted throughout.  Sensory: Sensory testing is intact to soft touch on all 4 extremities. No evidence of extinction is noted.  Coordination: Cerebellar testing reveals good finger-nose-finger and heel-to-shin bilaterally.  Gait and station: Gait is normal. Tandem gait is normal. Romberg is negative. No drift is seen.  Reflexes: Deep tendon reflexes are symmetric and normal bilaterally.   DIAGNOSTIC DATA (LABS, IMAGING, TESTING) - I  reviewed patient records, labs, notes, testing and imaging myself where available.  Lab Results  Component Value Date   WBC 12.9 (H) 11/09/2016   HGB 15.0 11/09/2016   HCT 44.1 11/09/2016   MCV 90.4 11/09/2016   PLT 294 11/09/2016      Component Value Date/Time   NA 140 11/09/2016 1134   K 4.6 11/09/2016 1134   CL 106 11/09/2016 1134   CO2 18 (L) 11/09/2016 1134   GLUCOSE 72 11/09/2016 1134   BUN 18 11/09/2016 1134   CREATININE 1.07 (H) 11/09/2016 1134   CALCIUM 9.8 11/09/2016 1134   PROT 7.2 11/09/2016 1134   ALBUMIN 4.3 11/09/2016 1134   AST 35 11/09/2016 1134   ALT 26 11/09/2016 1134   ALKPHOS 83 11/09/2016 1134   BILITOT 1.1 11/09/2016 1134   GFRNONAA 54 (L) 11/09/2016 1134   GFRAA >60 11/09/2016 1134      ASSESSMENT AND PLAN 67 y.o. year old female  has a past medical history of Abscessed tooth, Allergic rhinitis, Asthma, Asthma, Celiac disease, Depression with anxiety, Dysmenorrhea, Gastroparesis, GERD (gastroesophageal reflux disease), low back pain, IBS (irritable bowel syndrome), Migraine, Migraine without aura, with intractable migraine, so stated, without mention of status migrainosus (05/20/2013), and Osteopenia. here with:  1.  Migraine headache  Overall the patient is doing well.  She will continue on Zonegran 100 mg in the morning and 150 mg in the evening.  She will continue on Maxalt as well.  All she is advised that if her symptoms worsen or she develops new symptoms she should let us know.  She will follow-up in 6 months or sooner if needed.   Ward Givens, MSN, NP-C 07/23/2017, 8:56 AM [facility] [address], [place], [place] [address] [phone]  I spent 15 minutes with the patient. 50% of this time was spent discussing her migraine headaches and treatment plan

## 2017-07-23 NOTE — Patient Instructions (Signed)
Your Plan:  Continue Zonegran and maxalt If your symptoms worsen or you develop new symptoms please let us know.   Thank you for coming to see [place] at [facility]. I hope we have been able to provide you high quality care today.  You may receive a patient satisfaction survey over the next few weeks. We would appreciate your feedback and comments so that we may continue to improve ourselves and the health of our patients.

## 2017-07-26 DIAGNOSIS — N3010 Interstitial cystitis (chronic) without hematuria: Secondary | ICD-10-CM | POA: Diagnosis not present

## 2017-07-27 DIAGNOSIS — A049 Bacterial intestinal infection, unspecified: Secondary | ICD-10-CM | POA: Diagnosis not present

## 2017-07-27 DIAGNOSIS — K6389 Other specified diseases of intestine: Secondary | ICD-10-CM | POA: Diagnosis not present

## 2017-07-27 DIAGNOSIS — R197 Diarrhea, unspecified: Secondary | ICD-10-CM | POA: Diagnosis not present

## 2017-07-27 DIAGNOSIS — R109 Unspecified abdominal pain: Secondary | ICD-10-CM | POA: Diagnosis not present

## 2017-07-30 DIAGNOSIS — Z0000 Encounter for general adult medical examination without abnormal findings: Secondary | ICD-10-CM | POA: Diagnosis not present

## 2017-07-30 DIAGNOSIS — E7800 Pure hypercholesterolemia, unspecified: Secondary | ICD-10-CM | POA: Diagnosis not present

## 2017-07-30 DIAGNOSIS — N3010 Interstitial cystitis (chronic) without hematuria: Secondary | ICD-10-CM | POA: Diagnosis not present

## 2017-07-30 DIAGNOSIS — K6389 Other specified diseases of intestine: Secondary | ICD-10-CM | POA: Diagnosis not present

## 2017-07-30 DIAGNOSIS — R110 Nausea: Secondary | ICD-10-CM | POA: Diagnosis not present

## 2017-07-31 DIAGNOSIS — N3010 Interstitial cystitis (chronic) without hematuria: Secondary | ICD-10-CM | POA: Diagnosis not present

## 2017-08-17 DIAGNOSIS — N3010 Interstitial cystitis (chronic) without hematuria: Secondary | ICD-10-CM | POA: Diagnosis not present

## 2017-08-30 DIAGNOSIS — K5641 Fecal impaction: Secondary | ICD-10-CM | POA: Diagnosis not present

## 2017-08-30 DIAGNOSIS — R197 Diarrhea, unspecified: Secondary | ICD-10-CM | POA: Diagnosis not present

## 2017-08-30 DIAGNOSIS — K449 Diaphragmatic hernia without obstruction or gangrene: Secondary | ICD-10-CM | POA: Diagnosis not present

## 2017-09-13 DIAGNOSIS — M25511 Pain in right shoulder: Secondary | ICD-10-CM | POA: Diagnosis not present

## 2017-09-13 DIAGNOSIS — G4700 Insomnia, unspecified: Secondary | ICD-10-CM | POA: Diagnosis not present

## 2017-09-13 DIAGNOSIS — R509 Fever, unspecified: Secondary | ICD-10-CM | POA: Diagnosis not present

## 2017-09-13 DIAGNOSIS — J0110 Acute frontal sinusitis, unspecified: Secondary | ICD-10-CM | POA: Diagnosis not present

## 2017-09-13 DIAGNOSIS — I10 Essential (primary) hypertension: Secondary | ICD-10-CM | POA: Diagnosis not present

## 2017-09-16 ENCOUNTER — Inpatient Hospital Stay (HOSPITAL_BASED_OUTPATIENT_CLINIC_OR_DEPARTMENT_OTHER)
Admission: EM | Admit: 2017-09-16 | Discharge: 2017-09-19 | DRG: 871 | Disposition: A | Discharge status: Home / Self Care | Payer: Medicare Other | Attending: Internal Medicine | Admitting: Internal Medicine

## 2017-09-16 ENCOUNTER — Emergency Department (HOSPITAL_BASED_OUTPATIENT_CLINIC_OR_DEPARTMENT_OTHER): Payer: Medicare Other

## 2017-09-16 ENCOUNTER — Other Ambulatory Visit: Payer: Self-pay

## 2017-09-16 ENCOUNTER — Encounter (HOSPITAL_BASED_OUTPATIENT_CLINIC_OR_DEPARTMENT_OTHER): Payer: Self-pay | Admitting: Emergency Medicine

## 2017-09-16 DIAGNOSIS — E860 Dehydration: Secondary | ICD-10-CM | POA: Diagnosis present

## 2017-09-16 DIAGNOSIS — R945 Abnormal results of liver function studies: Secondary | ICD-10-CM

## 2017-09-16 DIAGNOSIS — D649 Anemia, unspecified: Secondary | ICD-10-CM | POA: Diagnosis present

## 2017-09-16 DIAGNOSIS — W57XXXA Bitten or stung by nonvenomous insect and other nonvenomous arthropods, initial encounter: Secondary | ICD-10-CM | POA: Diagnosis not present

## 2017-09-16 DIAGNOSIS — Z79890 Hormone replacement therapy (postmenopausal): Secondary | ICD-10-CM | POA: Diagnosis not present

## 2017-09-16 DIAGNOSIS — E878 Other disorders of electrolyte and fluid balance, not elsewhere classified: Secondary | ICD-10-CM | POA: Diagnosis present

## 2017-09-16 DIAGNOSIS — K219 Gastro-esophageal reflux disease without esophagitis: Secondary | ICD-10-CM | POA: Diagnosis present

## 2017-09-16 DIAGNOSIS — K6389 Other specified diseases of intestine: Secondary | ICD-10-CM

## 2017-09-16 DIAGNOSIS — Z87891 Personal history of nicotine dependence: Secondary | ICD-10-CM

## 2017-09-16 DIAGNOSIS — R05 Cough: Secondary | ICD-10-CM | POA: Diagnosis not present

## 2017-09-16 DIAGNOSIS — R109 Unspecified abdominal pain: Secondary | ICD-10-CM

## 2017-09-16 DIAGNOSIS — K449 Diaphragmatic hernia without obstruction or gangrene: Secondary | ICD-10-CM | POA: Diagnosis present

## 2017-09-16 DIAGNOSIS — Z791 Long term (current) use of non-steroidal anti-inflammatories (NSAID): Secondary | ICD-10-CM

## 2017-09-16 DIAGNOSIS — Z823 Family history of stroke: Secondary | ICD-10-CM

## 2017-09-16 DIAGNOSIS — A419 Sepsis, unspecified organism: Secondary | ICD-10-CM | POA: Diagnosis not present

## 2017-09-16 DIAGNOSIS — Z8249 Family history of ischemic heart disease and other diseases of the circulatory system: Secondary | ICD-10-CM

## 2017-09-16 DIAGNOSIS — K900 Celiac disease: Secondary | ICD-10-CM | POA: Diagnosis not present

## 2017-09-16 DIAGNOSIS — E876 Hypokalemia: Secondary | ICD-10-CM | POA: Diagnosis present

## 2017-09-16 DIAGNOSIS — R1011 Right upper quadrant pain: Secondary | ICD-10-CM | POA: Diagnosis not present

## 2017-09-16 DIAGNOSIS — N179 Acute kidney failure, unspecified: Secondary | ICD-10-CM

## 2017-09-16 DIAGNOSIS — G43909 Migraine, unspecified, not intractable, without status migrainosus: Secondary | ICD-10-CM | POA: Diagnosis present

## 2017-09-16 DIAGNOSIS — E872 Acidosis: Secondary | ICD-10-CM | POA: Diagnosis present

## 2017-09-16 DIAGNOSIS — R6521 Severe sepsis with septic shock: Secondary | ICD-10-CM | POA: Diagnosis not present

## 2017-09-16 DIAGNOSIS — R6520 Severe sepsis without septic shock: Secondary | ICD-10-CM | POA: Diagnosis not present

## 2017-09-16 DIAGNOSIS — E871 Hypo-osmolality and hyponatremia: Secondary | ICD-10-CM | POA: Diagnosis not present

## 2017-09-16 DIAGNOSIS — R42 Dizziness and giddiness: Secondary | ICD-10-CM | POA: Diagnosis not present

## 2017-09-16 DIAGNOSIS — D696 Thrombocytopenia, unspecified: Secondary | ICD-10-CM

## 2017-09-16 DIAGNOSIS — R7989 Other specified abnormal findings of blood chemistry: Secondary | ICD-10-CM

## 2017-09-16 DIAGNOSIS — R799 Abnormal finding of blood chemistry, unspecified: Secondary | ICD-10-CM | POA: Diagnosis not present

## 2017-09-16 LAB — I-STAT CG4 LACTIC ACID, ED
LACTIC ACID, VENOUS: 2.14 mmol/L — AB (ref 0.5–1.9)
Lactic Acid, Venous: 2.67 mmol/L (ref 0.5–1.9)

## 2017-09-16 LAB — URINALYSIS, MICROSCOPIC (REFLEX): RBC / HPF: NONE SEEN RBC/hpf (ref 0–5)

## 2017-09-16 LAB — URINALYSIS, ROUTINE W REFLEX MICROSCOPIC
Bilirubin Urine: NEGATIVE
Glucose, UA: NEGATIVE mg/dL
Hgb urine dipstick: NEGATIVE
Ketones, ur: NEGATIVE mg/dL
Leukocytes, UA: NEGATIVE
NITRITE: NEGATIVE
PH: 6 (ref 5.0–8.0)
Protein, ur: 30 mg/dL — AB
SPECIFIC GRAVITY, URINE: 1.02 (ref 1.005–1.030)

## 2017-09-16 LAB — COMPREHENSIVE METABOLIC PANEL
ALBUMIN: 3.5 g/dL (ref 3.5–5.0)
ALT: 33 U/L (ref 14–54)
AST: 52 U/L — ABNORMAL HIGH (ref 15–41)
Alkaline Phosphatase: 63 U/L (ref 38–126)
Anion gap: 9 (ref 5–15)
BUN: 25 mg/dL — ABNORMAL HIGH (ref 6–20)
CO2: 21 mmol/L — AB (ref 22–32)
Calcium: 8.2 mg/dL — ABNORMAL LOW (ref 8.9–10.3)
Chloride: 99 mmol/L — ABNORMAL LOW (ref 101–111)
Creatinine, Ser: 1.34 mg/dL — ABNORMAL HIGH (ref 0.44–1.00)
GFR calc non Af Amer: 41 mL/min — ABNORMAL LOW (ref >60)
GFR, EST AFRICAN AMERICAN: 47 mL/min — AB (ref >60)
GLUCOSE: 140 mg/dL — AB (ref 65–99)
POTASSIUM: 3.5 mmol/L (ref 3.5–5.1)
SODIUM: 129 mmol/L — AB (ref 135–145)
Total Bilirubin: 0.4 mg/dL (ref 0.3–1.2)
Total Protein: 6.8 g/dL (ref 6.5–8.1)

## 2017-09-16 LAB — CBC WITH DIFFERENTIAL/PLATELET
BASOS ABS: 0 10*3/uL (ref 0.0–0.1)
Basophils Relative: 0 %
Eosinophils Absolute: 0 10*3/uL (ref 0.0–0.7)
Eosinophils Relative: 0 %
HEMATOCRIT: 33.8 % — AB (ref 36.0–46.0)
HEMOGLOBIN: 11.9 g/dL — AB (ref 12.0–15.0)
LYMPHS PCT: 8 %
Lymphs Abs: 0.5 10*3/uL (ref 0.7–4.0)
MCH: 31.7 pg (ref 26.0–34.0)
MCHC: 35.2 g/dL (ref 30.0–36.0)
MCV: 90.1 fL (ref 78.0–100.0)
MONO ABS: 0.3 10*3/uL (ref 0.1–1.0)
MONOS PCT: 5 %
NEUTROS ABS: 5.4 10*3/uL (ref 1.7–7.7)
NEUTROS PCT: 87 %
Platelets: 72 10*3/uL — ABNORMAL LOW (ref 150–400)
RBC: 3.75 MIL/uL — ABNORMAL LOW (ref 3.87–5.11)
RDW: 13.1 % (ref 11.5–15.5)
WBC: 6.3 10*3/uL (ref 4.0–10.5)

## 2017-09-16 LAB — LIPASE, BLOOD: Lipase: 26 U/L (ref 11–51)

## 2017-09-16 LAB — PROCALCITONIN: Procalcitonin: 0.98 ng/mL

## 2017-09-16 LAB — LACTIC ACID, PLASMA: Lactic Acid, Venous: 2.3 mmol/L (ref 0.5–1.9)

## 2017-09-16 LAB — TROPONIN I: Troponin I: <0.03 ng/mL (ref <0.03)

## 2017-09-16 MED ORDER — VANCOMYCIN HCL 10 G IV SOLR
1500.0000 mg | Freq: Once | INTRAVENOUS | Status: AC
  Administered 2017-09-16: 1500 mg via INTRAVENOUS
  Filled 2017-09-16: qty 1500

## 2017-09-16 MED ORDER — SODIUM CHLORIDE 0.9 % IV BOLUS (SEPSIS)
1000.0000 mL | Freq: Once | INTRAVENOUS | Status: AC
  Administered 2017-09-16: 1000 mL via INTRAVENOUS

## 2017-09-16 MED ORDER — PIPERACILLIN-TAZOBACTAM 3.375 G IVPB 30 MIN
3.3750 g | Freq: Once | INTRAVENOUS | Status: AC
  Administered 2017-09-16: 3.375 g via INTRAVENOUS
  Filled 2017-09-16 (×2): qty 50

## 2017-09-16 MED ORDER — SODIUM CHLORIDE 0.9 % IV BOLUS
1000.0000 mL | Freq: Once | INTRAVENOUS | Status: AC
  Administered 2017-09-16: 1000 mL via INTRAVENOUS

## 2017-09-16 MED ORDER — SODIUM CHLORIDE 0.9 % IV SOLN
INTRAVENOUS | Status: DC
  Administered 2017-09-16 – 2017-09-17 (×4): via INTRAVENOUS

## 2017-09-16 MED ORDER — VANCOMYCIN HCL IN DEXTROSE 1-5 GM/200ML-% IV SOLN: 1000.0000 mg | INTRAVENOUS | Status: DC

## 2017-09-16 MED ORDER — SODIUM CHLORIDE 0.9 % IV BOLUS (SEPSIS): 1000.0000 mL | Freq: Once | INTRAVENOUS | Status: DC

## 2017-09-16 MED ORDER — PHENYLEPHRINE HCL-NACL 10-0.9 MG/250ML-% IV SOLN
0.0000 ug/min | INTRAVENOUS | Status: DC
  Administered 2017-09-16: 40 ug/min via INTRAVENOUS
  Administered 2017-09-16: 20 ug/min via INTRAVENOUS
  Administered 2017-09-17: 40 ug/min via INTRAVENOUS
  Administered 2017-09-17: 10 ug/min via INTRAVENOUS
  Administered 2017-09-17: 40 ug/min via INTRAVENOUS
  Filled 2017-09-16 (×5): qty 250

## 2017-09-16 MED ORDER — PIPERACILLIN-TAZOBACTAM 3.375 G IVPB
3.3750 g | Freq: Three times a day (TID) | INTRAVENOUS | Status: DC
  Administered 2017-09-16 – 2017-09-19 (×10): 3.375 g via INTRAVENOUS
  Filled 2017-09-16 (×10): qty 50

## 2017-09-16 MED ORDER — VANCOMYCIN HCL 500 MG IV SOLR
INTRAVENOUS | Status: AC
  Filled 2017-09-16: qty 500

## 2017-09-16 MED ORDER — SODIUM CHLORIDE 0.9 % IV SOLN
1250.0000 mg | Freq: Once | INTRAVENOUS | Status: DC
  Filled 2017-09-16: qty 1250

## 2017-09-16 MED ORDER — DOXYCYCLINE HYCLATE 100 MG PO TABS
100.0000 mg | ORAL_TABLET | Freq: Once | ORAL | Status: AC
  Administered 2017-09-16: 100 mg via ORAL
  Filled 2017-09-16: qty 1

## 2017-09-16 MED ORDER — ACETAMINOPHEN 500 MG PO TABS
500.0000 mg | ORAL_TABLET | Freq: Once | ORAL | Status: AC
  Administered 2017-09-16: 500 mg via ORAL
  Filled 2017-09-16: qty 1

## 2017-09-16 MED ORDER — DOXYCYCLINE HYCLATE 100 MG PO TABS
100.0000 mg | ORAL_TABLET | Freq: Two times a day (BID) | ORAL | Status: DC
  Administered 2017-09-16 – 2017-09-17 (×2): 100 mg via ORAL
  Filled 2017-09-16 (×3): qty 1

## 2017-09-16 MED ORDER — ONDANSETRON HCL 4 MG/2ML IJ SOLN
4.0000 mg | Freq: Three times a day (TID) | INTRAMUSCULAR | Status: DC | PRN
  Administered 2017-09-17: 4 mg via INTRAVENOUS
  Filled 2017-09-16: qty 2

## 2017-09-16 MED ORDER — ZONISAMIDE 100 MG PO CAPS: 100.0000 mg | ORAL_CAPSULE | Freq: Two times a day (BID) | ORAL | Status: DC

## 2017-09-16 MED ORDER — PIPERACILLIN-TAZOBACTAM 3.375 G IVPB: 3.3750 g | Freq: Three times a day (TID) | INTRAVENOUS | Status: DC

## 2017-09-16 MED ORDER — ONDANSETRON HCL 4 MG/2ML IJ SOLN
4.0000 mg | Freq: Once | INTRAMUSCULAR | Status: AC
  Administered 2017-09-16: 4 mg via INTRAVENOUS
  Filled 2017-09-16: qty 2

## 2017-09-16 MED ORDER — VANCOMYCIN HCL 1000 MG IV SOLR
INTRAVENOUS | Status: AC
  Administered 2017-09-16: 1500 mg
  Filled 2017-09-16: qty 1000

## 2017-09-16 MED ORDER — ALUM & MAG HYDROXIDE-SIMETH 200-200-20 MG/5ML PO SUSP: 30.0000 mL | ORAL | Status: DC | PRN

## 2017-09-16 MED ORDER — ACETAMINOPHEN 325 MG PO TABS
650.0000 mg | ORAL_TABLET | Freq: Once | ORAL | Status: AC
  Administered 2017-09-16: 650 mg via ORAL
  Filled 2017-09-16: qty 2

## 2017-09-16 NOTE — ED Provider Notes (Signed)
Pt presented to the ED with fever, hypotension, concerning for sepsis.  Started with fever earlier in the week and started on empiric abx by PCP per patient.  Recent tick bites, no rash.  No dysuria, or other symptoms to suggest definite source.  Possible RMSF with her thrombocytopenia and hyponatremia.  Empiric abx started to include coverage for tick borne illness.  BP has improved with treatment.    Will continue to monitor closely.  Plan on admission to a hospital.  Medical screening examination/treatment/procedure(s) were conducted as a shared visit with non-physician practitioner(s) and myself.  I personally evaluated the patient during the encounter.  EKG Interpretation  Date/Time:  Sunday September 16 2017 10:15:06 EDT Ventricular Rate:  96 PR Interval:    QRS Duration: 97 QT Interval:  352 QTC Calculation: 445 R Axis:   75 Text Interpretation:  Sinus rhythm Low voltage, precordial leads No old tracing to compare Confirmed by Dorie Rank [phone]) on 09/16/2017 10:31:30 AM     Dorie Rank, MD 09/16/17 [phone]

## 2017-09-16 NOTE — Progress Notes (Signed)
Pharmacy Antibiotic Note  Katelyn Mcclain is a 67 y.o. female admitted on 09/16/2017 with sepsis.  Pharmacy has been consulted for vancomycin + piperacillin/tazobactam dosing.  Patient transferred from [facility] - confirmed with MD on admission that vancomycin, piperacillin/tazobactam, and doxycycline are to be continued.  Pt received vancomycin 1500 mg LD + pip/tazo 3.375 g IV and doxycycline 100 mg once prior to transfer.   Pt presenting with a history of dry cough, sore throat, rhinorrhea. Recently prescribed SMX/TMP and levofloxacin outpatient. Antibiotics started for sepsis unknown origin. Doxycycline started - RMSF titers pending.  09/16/17, today  WBC 6.3  Tmax 102.2 F  Plan:  Piperacillin/tazobactam 3.375 g IV q8h EI  Vancomycin 1500 mg LD followed by vancomycin 1000 mg IV q24h  Goal AUC 400-500  Follow SCr and vancomycin levels at steady state  Height: 5' 10.5" (179.1 cm) Weight: 146 lb 6.2 oz (66.4 kg) IBW/kg (Calculated) : 69.65  Temp (24hrs), Avg:100.2 F (37.9 C), Min:99.1 F (37.3 C), Max:102.2 F (39 C)  Recent Labs  Lab 09/16/17 0954 09/16/17 0957 09/16/17 1158  WBC 6.3  --   --   CREATININE 1.34*  --   --   LATICACIDVEN  --  2.14* 2.67*    Estimated Creatinine Clearance: 43.9 mL/min (A) (by C-G formula based on SCr of 1.34 mg/dL (H)).    Allergies  Allergen Reactions  . Adhesive [Tape] Other (See Comments)    Tear's skin off   . Morphine And Related Other (See Comments)    Sensitive to narcotics, worsening migraines.  . Clonazepam Other (See Comments)    Caused anxiety  . Lactose Intolerance (Gi) Diarrhea and Nausea And Vomiting  . Linaclotide Other (See Comments)    Caused spastic bladder (reaction to Linzess)  . Other     Pt does not use artificial sweeteners     Antimicrobials this admission: Vancomycin 6/9 >> Piperacillin/tazobactam 6/9 >>  Doxycycline 6/9 >>  Dose adjustments this admission:  Microbiology  results: 6/9 BCx: Sent 6/9 UCx: Sent  6/9 MRSA PCR: Sent  Thank you for allowing pharmacy to be a part of this patient's care.  Lenis Noon, PharmD, BCPS Clinical Pharmacist 09/16/2017 4:12 PM

## 2017-09-16 NOTE — ED Notes (Signed)
Report given to Clyda Greener, primary nurse for 1224, at [facility]

## 2017-09-16 NOTE — ED Notes (Signed)
Ambulated to BR , to void. U/A obtained, gait steady

## 2017-09-16 NOTE — Progress Notes (Signed)
[facility] Progress Note Patient Name: Katelyn Mcclain DOB: 08-06-1950 MRN: [id number]   Date of Service  09/16/2017  HPI/Events of Note  Fever to 102.2 F - Already on Vancomycin and Zosyn. As per my previous note, AST is elevated and patient says Motrin upsets her stomach.   eICU Interventions  Will order: 1. Ice packs PRN.   If ice packs not effective in reducing her temperature, may require cooling blanket.      Intervention Category Major Interventions: Other:  Lysle Dingwall 09/16/2017, 11:31 PM

## 2017-09-16 NOTE — ED Notes (Signed)
Patient transported to X-ray 

## 2017-09-16 NOTE — Progress Notes (Signed)
eLink Physician-Brief Progress Note Patient Name: Katelyn Mcclain DOB: [birth year] MRN: [id number]   Date of Service  09/16/2017  HPI/Events of Note  Patient c/o total body aches. Requests Tylenol. AST = 52. Creatinine = 1.52. Patient states that Motrin upsets her stomach.   eICU Interventions  Will order: 1. Tylenol 650 mg PO X 1 now.      Intervention Category Major Interventions: Other:  Viktorya Arguijo Cornelia Copa 09/16/2017, 9:03 PM

## 2017-09-16 NOTE — ED Notes (Signed)
Spoke with Camera operator at [employer], will call back for report

## 2017-09-16 NOTE — ED Triage Notes (Addendum)
Pt reports fever x 4 days. Denies vomiting and diarrhea. Also c/o sore throat and cough. Pt states she is currently being tx with abx for a bacterial infection in her stomach and also mentions that she has had several tick bites over the last 2 weeks.

## 2017-09-16 NOTE — H&P (Addendum)
History and Physical    Katelyn Mcclain SJG:[id number] DOB: December 30, 1950 DOA: 09/16/2017  PCP: Jani Gravel, MD   Patient coming from: Emergency department of [facility]    Chief Complaint: Fever, generalized weakness  HPI: Katelyn Mcclain is a 67 y.o. female with medical history significant of celiac disease, bacterial overgrowth syndrome, GERD, migraine headaches who presented to the [facility] with complaints of fever for last 1 week.  Patient was increasingly weak and had not eaten anything because of severe loss of appetite.  Patient was recently seen by her PCP and was treated with Levaquin for fever.  She was also recently treated with Bactrim for bacterial overgrowth syndrome  by her gastroenterologist at [facility].  Patient reports of fever and chills, nausea.  She also reports of multiple tick bites within last few weeks but this is not a new thing for her as [place]  at [facility].  Patient was found to be hypotensive and febrile on presentation to the emergency department at [facility].  She was found to have elevated lactic acid level and acute kidney injury with thrombocytopenia.  Code sepsis was called there. Patient sent here with direct admission. Patient already received about 3 L of bolus before arriving to [facility] without much improvement in the blood pressure.  Source of sepsis is unknown.  Chest x-ray did not show any pneumonia.  Her abdomen is soft and nontender and she does not complain of any abdominal pain.  She denies any dysuria . Patient seen and examined the bedside.  Looks very weak and dehydrated.  Her blood pressure was in the range of 86 /44 mmHg.  Patient was alert and oriented.  She denies any chest pain, shortness of breath, abdominal pain, dysuria, vomiting, diarrhea or headache.  ED Course: She was given 3 L of normal saline boluses and started on empiric antibiotics with doxycycline, Vanco  and Zosyn.  Review of Systems: As per HPI otherwise 10 point review of systems negative.    Past Medical History:  Diagnosis Date  . Abscessed tooth   . Allergic rhinitis   . Asthma   . Asthma    inhaler prn  . Celiac disease   . Depression with anxiety   . Dysmenorrhea   . Gastroparesis   . GERD (gastroesophageal reflux disease)   . Hx of low back pain   . IBS (irritable bowel syndrome)   . Migraine   . Migraine without aura, with intractable migraine, so stated, without mention of status migrainosus 05/20/2013  . Osteopenia     Past Surgical History:  Procedure Laterality Date  . ABDOMINAL SURGERY    . BREAST BIOPSY Right    x2  . BREAST CYST ASPIRATION  1999  . COLONOSCOPY    . FOOT SURGERY Left    Bunionectomy  . MASS EXCISION  08/08/2011   Procedure: MINOR EXCISION OF MASS;  Surgeon: Cammie Sickle., MD;  Location: [facility];  Service: Orthopedics;  Laterality: Left;  left ring excision cyst proximal phalangeal joint  . NISSEN FUNDOPLICATION    . ROTATOR CUFF REPAIR Right 05/2015  . TONSILLECTOMY       reports that she has quit smoking. She has never used smokeless tobacco. She reports that she drinks alcohol. She reports that she does not use drugs.  Allergies  Allergen Reactions  . Adhesive [Tape] Other (See Comments)  Tear's skin off   . Morphine And Related Other (See Comments)    Sensitive to narcotics, worsening migraines.  . Clonazepam Other (See Comments)    Caused anxiety  . Lactose Intolerance (Gi) Diarrhea and Nausea And Vomiting  . Linaclotide Other (See Comments)    Caused spastic bladder (reaction to Linzess)  . Other     Pt does not use artificial sweeteners     Family History  Problem Relation Age of Onset  . Stroke Father   . Dementia Father   . Atrial fibrillation Father   . Cancer Father   . Hypertension Mother   . Scoliosis Mother   . Heart disease Sister        Congenital heart disease  . Migraines Neg  Hx      Prior to Admission medications   Medication Sig Start Date End Date Taking? Authorizing Provider  diclofenac sodium (VOLTAREN) 1 % GEL Apply 1/2 inch to painful area once a day 07/27/17  Yes [provider]  acetaminophen (TYLENOL) 500 MG tablet Take 500 mg by mouth every 6 (six) hours as needed (pain).    [provider]  albuterol (PROVENTIL HFA;VENTOLIN HFA) 108 (90 BASE) MCG/ACT inhaler Inhale 1 puff into the lungs every 6 (six) hours as needed for wheezing or shortness of breath (seasonal allergies).     [provider]  b complex vitamins tablet Take 1 tablet by mouth daily.    [provider]  Calcium-Magnesium-Vitamin D (CALCIUM 1200+D3 PO) Take 1,200 mg by mouth daily.    [provider]  cycloSPORINE (RESTASIS) 0.05 % ophthalmic emulsion 1 drop.    [provider]  diazepam (VALIUM) 10 MG tablet INSERT 1 TABLET VAGINALLY EVERY NIGHT AT BEDTIME AS DIRECTED 08/17/17   [provider]  dicyclomine (BENTYL) 20 MG tablet Take 20 mg by mouth 2 (two) times daily as needed for spasms (stomach pain).  06/26/16   [provider]  Estrogens, Conjugated (PREMARIN VA) Place vaginally.    [provider]  Eszopiclone (ESZOPICLONE) 3 MG TABS Take 1.5 mg by mouth at bedtime as needed (sleep). Take immediately before bedtime     [provider]  Garlic (GARLIQUE PO) Take 1 capsule by mouth daily.     [provider]  ketorolac (TORADOL) 10 MG tablet Take by mouth.    [provider]  levofloxacin (LEVAQUIN) 500 MG tablet  09/13/17   [provider]  meloxicam (MOBIC) 15 MG tablet Take 15 mg by mouth daily. 08/17/17   [provider]  Miconazole Nitrate (MONISTAT 7 VA) Place 1 application vaginally at bedtime as needed (yeast infection).    [provider]  montelukast (SINGULAIR) 10 MG tablet Take 10 mg by mouth at bedtime.  11/03/13   [provider]    Multiple Vitamins-Minerals (PRESERVISION AREDS 2) CAPS Take 1 capsule by mouth daily.     [provider]  Omega-3 Fatty Acids (OMEGA 3 PO) Take 1,280 mg by mouth daily.    [provider]  omeprazole (PRILOSEC) 40 MG capsule Take 40 mg by mouth daily. 10/10/16   [provider]  ondansetron (ZOFRAN) 4 MG tablet TAKE 1 TABLET BY MOUTH EVERY 6 HOURS AS NEEDED FOR NAUSEA - IF PHERERGAN NOT WORKING 07/30/17   [provider]  OVER THE COUNTER MEDICATION Take 1 capsule by mouth 2 (two) times daily. IBGard    [provider]  OVER THE COUNTER MEDICATION Take 1 capsule by mouth  2 (two) times daily. FDGard    [provider]  OVER THE COUNTER MEDICATION Take 500 mg by mouth daily. Vitamin B5    [provider]  OVER THE COUNTER MEDICATION Take 500 mg by mouth daily. Berberine    [provider]  polyethylene glycol (MIRALAX / GLYCOLAX) packet Take 17 g by mouth at bedtime as needed (constipation). Mix in 8 oz liquid and drink     [provider]  Probiotic Product (PROBIOTIC DAILY PO) Take 1 tablet by mouth 2 (two) times daily.     [provider]  promethazine (PHENERGAN) 25 MG tablet Take 1 tablet (25 mg total) by mouth every 6 (six) hours as needed for nausea or vomiting. 11/30/15   Lawyer, Harrell Gave, PA-C  pyridoxine (B-6) 200 MG tablet Take 200 mg by mouth daily.    [provider]  rizatriptan (MAXALT-MLT) 10 MG disintegrating tablet Take 1 tablet PO at the onset of migraine and May repeat in 2 hours if needed. Not to exceed 2 tabs/24 hours. 01/11/17   Kathrynn Ducking, MD  sulfamethoxazole-trimethoprim (BACTRIM,SEPTRA) 400-80 MG tablet Take 1 tablet by mouth 2 (two) times daily. 08/26/17   [provider]  vitamin C (ASCORBIC ACID) 500 MG tablet Take 500 mg by mouth 2 (two) times daily.    [provider]  vortioxetine HBr (TRINTELLIX) 20 MG TABS Take 20 mg by mouth at bedtime.     [provider]  zonisamide (ZONEGRAN) 100 MG capsule Take 1 capsule (100 mg total) by mouth 2 (two) times daily. 07/23/17   Ward Givens, NP  zonisamide (ZONEGRAN) 25 MG capsule TAKE 2 CAPSULE EVERY EVENING WITH 100 MG CAPSULE TO MAKE 150 MG NIGHTLY DOSE. 07/23/17   Ward Givens, NP    Physical Exam: Vitals:   09/16/17 1400 09/16/17 1516 09/16/17 1535 09/16/17 1557  BP: (!) 84/49 (!) 94/41 (!) 86/44   Pulse: 89  92   Resp: 17 20 16    Temp:  99.1 F (37.3 C)  99.3 F (37.4 C)  TempSrc:  Oral  Oral  SpO2: 100%  92%   Weight:  66.4 kg (146 lb 6.2 oz)    Height:  5' 10.5" (1.791 m)      Constitutional: Very weak, ill looking Vitals:   09/16/17 1400 09/16/17 1516 09/16/17 1535 09/16/17 1557  BP: (!) 84/49 (!) 94/41 (!) 86/44   Pulse: 89  92   Resp: 17 20 16    Temp:  99.1 F (37.3 C)  99.3 F (37.4 C)  TempSrc:  Oral  Oral  SpO2: 100%  92%   Weight:  66.4 kg (146 lb 6.2 oz)    Height:  5' 10.5" (1.791 m)     Eyes: PERRL, lids and conjunctivae normal ENMT: Mucous membranes are dry . Posterior pharynx clear of any exudate or lesions.Normal dentition.  Neck: normal, supple, no masses, no thyromegaly Respiratory: clear to auscultation bilaterally, no wheezing, no crackles. Normal respiratory effort. No accessory muscle use.  Cardiovascular: Regular rate and rhythm, no murmurs / rubs / gallops. No extremity edema. 2+ pedal pulses. No carotid bruits.  Feeble pulse Abdomen: no tenderness, no masses palpated. No hepatosplenomegaly. Bowel sounds positive.  Musculoskeletal: no clubbing / cyanosis. No joint deformity upper and lower extremities. Good ROM, no contractures. Normal muscle tone.  Skin: no rashes, lesions, ulcers. No induration Neurologic: CN 2-12 grossly intact. Sensation intact, DTR normal. Strength 5/5 in all 4.  Psychiatric: Normal judgment and insight. Alert and oriented  x 3. Normal mood.  Foley Catheter:None  Labs on Admission: I have personally reviewed  following labs and imaging studies  CBC: Recent Labs  Lab 09/16/17 0954  WBC 6.3  NEUTROABS 5.4  HGB 11.9*  HCT 33.8*  MCV 90.1  PLT 72*   Basic Metabolic Panel: Recent Labs  Lab 09/16/17 0954  NA 129*  K 3.5  CL 99*  CO2 21*  GLUCOSE 140*  BUN 25*  CREATININE 1.34*  CALCIUM 8.2*   GFR: Estimated Creatinine Clearance: 43.9 mL/min (A) (by C-G formula based on SCr of 1.34 mg/dL (H)). Liver Function Tests: Recent Labs  Lab 09/16/17 0954  AST 52*  ALT 33  ALKPHOS 63  BILITOT 0.4  PROT 6.8  ALBUMIN 3.5   Recent Labs  Lab 09/16/17 0954  LIPASE 26   No results for input(s): AMMONIA in the last 168 hours. Coagulation Profile: No results for input(s): INR, PROTIME in the last 168 hours. Cardiac Enzymes: Recent Labs  Lab 09/16/17 0954  TROPONINI <0.03   BNP (last 3 results) No results for input(s): PROBNP in the last 8760 hours. HbA1C: No results for input(s): HGBA1C in the last 72 hours. CBG: No results for input(s): GLUCAP in the last 168 hours. Lipid Profile: No results for input(s): CHOL, HDL, LDLCALC, TRIG, CHOLHDL, LDLDIRECT in the last 72 hours. Thyroid Function Tests: No results for input(s): TSH, T4TOTAL, FREET4, T3FREE, THYROIDAB in the last 72 hours. Anemia Panel: No results for input(s): VITAMINB12, FOLATE, FERRITIN, TIBC, IRON, RETICCTPCT in the last 72 hours. Urine analysis:    Component Value Date/Time   COLORURINE YELLOW 09/16/2017 [place] 09/16/2017 1127   LABSPEC 1.020 09/16/2017 1127   PHURINE 6.0 09/16/2017 1127   GLUCOSEU NEGATIVE 09/16/2017 1127   HGBUR NEGATIVE 09/16/2017 1127   BILIRUBINUR NEGATIVE 09/16/2017 1127   KETONESUR NEGATIVE 09/16/2017 1127   PROTEINUR 30 (A) 09/16/2017 1127   UROBILINOGEN 1.0 03/12/2011 1939   NITRITE NEGATIVE 09/16/2017 1127   LEUKOCYTESUR NEGATIVE 09/16/2017 1127    Radiological Exams on Admission: Dg Chest 2 View  Result Date: 09/16/2017 CLINICAL DATA:  Cough and fever  EXAM: CHEST - 2 VIEW COMPARISON:  December 07, 2015 FINDINGS: There is no edema or consolidation. The heart size and pulmonary vascularity are normal. No adenopathy. No evident bone lesions. IMPRESSION: No edema or consolidation. Electronically Signed   By: Lowella Grip III M.D.   On: 09/16/2017 10:57     Assessment/Plan Principal Problem:   Severe sepsis with septic shock (HCC) Active Problems:   Celiac disease   Bacterial overgrowth syndrome   Tick bites   AKI (acute kidney injury) ([place])   Hyponatremia   Thrombocytopenia (HCC)   Septic shock (HCC)  Severe sepsis with septic shock: Patient currently hypotensive despite being given 3 L of IV bolus.  We will continue IV fluids.  Lactic acid level elevated at 2.7.  Will check procalcitonin.  We will continue to monitor lactic acid level.  Try to maintain MAP more than 65 mmHg. I have discussed with PCCM attending Dr. Elsworth Soho, who will see the patient upon my request.  She might need vasopressor therapy to maintain her blood pressure. Continue broad-spectrum antibiotics vancomycin and Zosyn.  We will follow-up cultures.  UA not impressive of UTI, chest x-ray did not show any pneumonia.  Her abdomen is soft and nontender.  Acute kidney injury: Secondary to sepsis, dehydration.  We will continue IV fluids.  We will continue to monitor kidney function.  Hyponatremia: Continue normal saline  Thrombocytopenia: Could be secondary to sepsis.  Was also on antibiotics as an outpatient. We will continue to monitor.RMSF needs to be ruled out.  Tick bites: History of multiple tick bites which is not a new thing for her [place] .She says  she removes the ticks with alcohol swab and tweezers.  Regardless, needs to rule out [facility] spotted fever, Lyme disease.  Serology for  [facility] spotted fever/Lyme  Disease have been sent. Patient does not have any rashes or purpura.Added doxycycline.  History of celiac  disease/bacterial overgrowth syndrome: Follows with her gastroenterologist as an outpatient at [facility].  Recently treated with Bactrim for bacterial overgrowth syndrome.  History of migraines: Continue with zonisamide  Severity of Illness: The appropriate patient status for this patient is INPATIENT   DVT prophylaxis: SCD Code Status: Full Family Communication: None present at the bedside Consults called: PCCM     Shelly Coss MD [facility] Pager [phone]  If 7PM-7AM, please contact night-coverage [email or web address] Password [email or web address]  09/16/2017, 4:02 PM

## 2017-09-16 NOTE — ED Notes (Signed)
ED MD informed of temp 102

## 2017-09-16 NOTE — ED Notes (Signed)
Given po fluids, states is feeling" better" Husband at bedside

## 2017-09-16 NOTE — ED Notes (Signed)
Report given to Paul RN with [facility]. 

## 2017-09-16 NOTE — Consult Note (Signed)
Name: Katelyn Mcclain MRN: [id number] DOB: 05-Feb-1951    ADMISSION DATE:  09/16/2017 CONSULTATION DATE:  09/16/2017  REFERRING MD :  Tawanna Solo, triad  CHIEF COMPLAINT:  hypotension    HISTORY OF PRESENT ILLNESS: 67 year old woman with a history of bacterial overgrowth syndrome presented to [facility] emergency room with fever for 7 days and hypotension.  She was started on empiric Levaquin by her PCP. She has a history of bacterial overgrowth syndrome and follows with gastroenterology at [facility] I have reviewed notes in care everywhere including recent MRI that showed small hiatal hernia but no evidence of distended bowel.  She has recently been treated with Bactrim for this.  She also has a history of tick bites.  Blood pressure was in the 80s and arrival to [facility] emergency room with lactate of 2.1, she was given 3 L of IV fluids and transferred to rest along ICU.  Blood pressure remained in the low 90s to 80s, repeat lactate was 2.7 hence we are consulted  Labs did not show any leukocytosis, creatinine was 1.3, chest x-ray did not show any infiltrates UA was negative for nitrite and leukoesterase but microscopy showed many bacteria with WBC casts She has received broad-spectrum antibiotics  PAST MEDICAL HISTORY :   has a past medical history of Abscessed tooth, Allergic rhinitis, Asthma, Asthma, Celiac disease, Depression with anxiety, Dysmenorrhea, Gastroparesis, GERD (gastroesophageal reflux disease), low back pain, IBS (irritable bowel syndrome), Migraine, Migraine without aura, with intractable migraine, so stated, without mention of status migrainosus (05/20/2013), and Osteopenia.  has a past surgical history that includes Colonoscopy; Abdominal surgery; Mass excision (08/08/2011); Nissen fundoplication; Foot surgery (Left); Tonsillectomy; Rotator cuff repair (Right, 05/2015); Breast cyst aspiration (1999); and Breast biopsy (Right).    Prior to Admission medications     Medication Sig Start Date End Date Taking? Authorizing Provider  diclofenac sodium (VOLTAREN) 1 % GEL Apply 1/2 inch to painful area once a day 07/27/17  Yes [provider]  acetaminophen (TYLENOL) 500 MG tablet Take 500 mg by mouth every 6 (six) hours as needed (pain).    [provider]  albuterol (PROVENTIL HFA;VENTOLIN HFA) 108 (90 BASE) MCG/ACT inhaler Inhale 1 puff into the lungs every 6 (six) hours as needed for wheezing or shortness of breath (seasonal allergies).     [provider]  b complex vitamins tablet Take 1 tablet by mouth daily.    [provider]  Calcium-Magnesium-Vitamin D (CALCIUM 1200+D3 PO) Take 1,200 mg by mouth daily.    [provider]  cycloSPORINE (RESTASIS) 0.05 % ophthalmic emulsion 1 drop.    [provider]  diazepam (VALIUM) 10 MG tablet INSERT 1 TABLET VAGINALLY EVERY NIGHT AT BEDTIME AS DIRECTED 08/17/17   [provider]  dicyclomine (BENTYL) 20 MG tablet Take 20 mg by mouth 2 (two) times daily as needed for spasms (stomach pain).  06/26/16   [provider]  Estrogens, Conjugated (PREMARIN VA) Place vaginally.    [provider]  Eszopiclone (ESZOPICLONE) 3 MG TABS Take 1.5 mg by mouth at bedtime as needed (sleep). Take immediately before bedtime     [provider]  Garlic (GARLIQUE PO) Take 1 capsule by mouth daily.     [provider]  ketorolac (TORADOL) 10 MG tablet Take by mouth.    [provider]  levofloxacin (LEVAQUIN) 500 MG tablet  09/13/17   [provider]  meloxicam (MOBIC) 15 MG tablet Take 15 mg by mouth daily.  08/17/17   [provider]  Methen-Hyosc-Meth Blue-Na Phos (ME/NAPHOS/MB/HYO1) 81.6 MG TABS Take 1 tablet by mouth 3 (three) times daily as needed (urinary pain).  07/27/17   [provider]  Miconazole Nitrate (MONISTAT 7 VA) Place 1 application vaginally at bedtime as needed (yeast infection).    [provider]  montelukast (SINGULAIR) 10 MG tablet Take 10 mg by mouth at bedtime.  11/03/13   [provider]  Multiple Vitamins-Minerals (PRESERVISION AREDS 2) CAPS Take 1 capsule by mouth daily.     [provider]  Omega-3 Fatty Acids (OMEGA 3 PO) Take 1,280 mg by mouth daily.    [provider]  omeprazole (PRILOSEC) 40 MG capsule Take 40 mg by mouth daily. 10/10/16   [provider]  ondansetron (ZOFRAN) 4 MG tablet TAKE 1 TABLET BY MOUTH EVERY 6 HOURS AS NEEDED FOR NAUSEA - IF PHERERGAN NOT WORKING 07/30/17   [provider]  OVER THE COUNTER MEDICATION Take 1 capsule by mouth 2 (two) times daily. IBGard    [provider]  OVER THE COUNTER MEDICATION Take 1 capsule by mouth 2 (two) times daily. FDGard    [provider]  OVER THE COUNTER MEDICATION Take 500 mg by mouth daily. Vitamin B5    [provider]  OVER THE COUNTER MEDICATION Take 500 mg by mouth daily. Berberine    [provider]  polyethylene glycol (MIRALAX / GLYCOLAX) packet Take 17 g by mouth at bedtime as needed (constipation). Mix in 8 oz liquid and drink     [provider]  Probiotic Product (PROBIOTIC DAILY PO) Take 1 tablet by mouth 2 (two) times daily.     [provider]  promethazine (PHENERGAN) 25 MG tablet Take 1 tablet (25 mg total) by mouth every 6 (six) hours as needed for nausea or vomiting. 11/30/15   Lawyer, Harrell Gave, PA-C  pyridoxine (B-6) 200 MG tablet Take 200 mg by mouth daily.    [provider]  rizatriptan (MAXALT-MLT) 10 MG disintegrating tablet Take 1 tablet PO at the onset of migraine and May repeat in 2 hours if needed. Not to exceed 2 tabs/24 hours. 01/11/17   Kathrynn Ducking, MD  sulfamethoxazole-trimethoprim (BACTRIM,SEPTRA) 400-80 MG tablet Take 1 tablet by mouth 2 (two) times daily. 08/26/17   [provider]  vortioxetine HBr (TRINTELLIX) 20 MG TABS Take 20 mg by mouth at bedtime.     [provider]  zonisamide (ZONEGRAN) 100 MG capsule Take 1 capsule (100 mg total) by mouth 2 (two) times daily. 07/23/17   Ward Givens, NP  zonisamide (ZONEGRAN) 25 MG capsule TAKE 2 CAPSULE EVERY EVENING WITH 100 MG CAPSULE TO MAKE 150 MG NIGHTLY DOSE. 07/23/17   Ward Givens, NP   Allergies  Allergen Reactions  . Adhesive [Tape] Other (See Comments)    Tear's skin off   . Morphine And Related Other (See Comments)    Sensitive to narcotics, worsening migraines.  . Clonazepam Other (See Comments)    Caused anxiety  . Lactose Intolerance (Gi) Diarrhea and Nausea And Vomiting  . Linaclotide Other (See Comments)    Caused spastic bladder (reaction to Linzess)  . Other     Pt does not use artificial sweeteners     FAMILY HISTORY:  family history includes Atrial fibrillation in her father; Cancer in her father; Dementia in her father; Heart disease in her sister; Hypertension in her mother; Scoliosis in her mother; Stroke in her father. SOCIAL HISTORY:  reports that she has quit smoking. She has never used smokeless tobacco. She reports that she drinks alcohol. She reports that she does not use drugs.  REVIEW OF SYSTEMS:   POSITIVE for for fever, chills, weight loss, malaise/fatigue and diaphoresis.   HENT: Negative for hearing loss, ear pain, nosebleeds, congestion, sore throat, neck pain, tinnitus and ear discharge.   Eyes: Negative for blurred vision, double vision, photophobia, pain, discharge and redness.  Respiratory: Negative for hemoptysis, sputum production, shortness of breath, wheezing and stridor.   Cardiovascular: Negative for chest pain, palpitations, orthopnea, claudication, leg swelling and PND.  Gastrointestinal: Negative for heartburn, nausea, vomiting, abdominal pain, diarrhea, constipation, blood in stool and melena.  Genitourinary: Negative for dysuria, urgency, frequency, hematuria and flank pain.  Musculoskeletal: Negative for myalgias, back  pain, joint pain and falls.  Skin: Negative for itching and rash.  Neurological: Negative for dizziness, tingling, tremors, sensory change, speech change, focal weakness, seizures, loss of consciousness, weakness and headaches.  Endo/Heme/Allergies: Negative for environmental allergies and polydipsia. Does not bruise/bleed easily.  SUBJECTIVE:   VITAL SIGNS: Temp:  [99.1 F (37.3 C)-102.2 F (39 C)] 99.3 F (37.4 C) (06/09 1557) Pulse Rate:  [89-102] 92 (06/09 1630) Resp:  [9-21] 15 (06/09 1630) BP: (80-105)/(41-75) 88/42 (06/09 1630) SpO2:  [92 %-100 %] 97 % (06/09 1630) Weight:  [143 lb (64.9 kg)-146 lb 6.2 oz (66.4 kg)] 146 lb 6.2 oz (66.4 kg) (06/09 1516)  PHYSICAL EXAMINATION: Gen. Pleasant, well-nourished, in no distress, normal affect ENT - no thrush, no post nasal drip Neck: No JVD, no thyromegaly, no carotid bruits Lungs: no use of accessory muscles, no dullness to percussion, clear without rales or rhonchi  Cardiovascular: Rhythm regular, heart sounds  normal, no murmurs or gallops, no peripheral edema Abdomen: soft and non-tender, no hepatosplenomegaly, BS normal. Musculoskeletal: No deformities, no cyanosis or clubbing Neuro:  alert, non focal Skin - no petechiae   Recent Labs  Lab 09/16/17 0954  NA 129*  K 3.5  CL 99*  CO2 21*  BUN 25*  CREATININE 1.34*  GLUCOSE 140*   Recent Labs  Lab 09/16/17 0954  HGB 11.9*  HCT 33.8*  WBC 6.3  PLT 72*   Dg Chest 2 View  Result Date: 09/16/2017 CLINICAL DATA:  Cough and fever EXAM: CHEST - 2 VIEW COMPARISON:  December 07, 2015 FINDINGS: There is no edema or consolidation. The heart size and pulmonary vascularity are normal. No adenopathy. No evident bone lesions. IMPRESSION: No edema or consolidation. Electronically Signed   By: Lowella Grip III M.D.   On: 09/16/2017 10:57    ASSESSMENT / PLAN:  Sepsis - source not clear, concern for WBC clumps and UA and history of bacterial overgrowth syndrome.  No evidence  of endocarditis.  She remains hypotensive in spite of 3 to 4 L of IV fluids she has received broad-spectrum antibiotics for above sources.  .  I did not feel that we have to worry about obstructive uropathy since her recent MRI showed normal kidneys.  Of note she was on Levaquin as outpatient she is mentating fine and does not have any other evidence of organ failure except for very mild AKI  We can start Neo-Synephrine peripherally and aim for systolic blood pressure of 90 or bowel or MEP of 65 and above if she needs high doses Neo-Synephrine then we will consider central line.    AKI - mild, Could be related to sepsis, WBC clumps in the urine also raises concern for  interstitial nephritis but more likely related to her history of interstitial cystitis  Mild hyponatremia and thrombocytopenia noted and being addressed by primary team. History of tick bites also noted.  Does not appear to have a unifying diagnosis at present but remain vigilant  Kara Mead MD. Sh[place] Flood. [place] [facility] Pager [phone] If no response call [phone]    09/16/2017, 4:43 PM

## 2017-09-16 NOTE — ED Notes (Signed)
ED Provider at bedside. 

## 2017-09-16 NOTE — Progress Notes (Signed)
Pharmacy Antibiotic Note  AZKA STEGER is a 67 y.o. female admitted on 09/16/2017 with sepsis.  Pharmacy has been consulted for vancomycin and zosyn dosing. Patient presents with 4 day history of fever, nausea, vomiting, and cough. Patient reports she is being treated for an "abdominal infection" (unknown diagnosis at this time) with antibiotics--most recently bactrim and Levaquin. Patient also reports she has had several tick bites in the past few weeks. Personally discussed with provider about starting doxycycline and will consider adding on after patient is thoroughly worked up for other sources of infection and agree with plan.   Patient presents with fever Tmax of 102.2, WBC count WNL at 6.3, lactic acid elevated at 2.14, Scr elevated from other noted labs at 1.34.   Plan:  Vancomycin 1500 mg IV x 1 dose for load  Then continue vancomycin 1 gram IV q24h  Zosyn 3.375g IV q8h (30 min infusion x1 then 4h EI dosing) Follow cultures, clinical progression, LOT  Height: 5\' 10"  (177.8 cm) Weight: 143 lb (64.9 kg) IBW/kg (Calculated) : 68.5  Temp (24hrs), Avg:101.1 F (38.4 C), Min:100 F (37.8 C), Max:102.2 F (39 C)  Recent Labs  Lab 09/16/17 0954 09/16/17 0957  WBC 6.3  --   CREATININE 1.34*  --   LATICACIDVEN  --  2.14*    Estimated Creatinine Clearance: 42.9 mL/min (A) (by C-G formula based on SCr of 1.34 mg/dL (H)).    Allergies  Allergen Reactions  . Adhesive [Tape] Other (See Comments)    Tear's skin off   . Morphine And Related Other (See Comments)    Sensitive to narcotics, worsening migraines.  . Clonazepam Other (See Comments)    Caused anxiety  . Lactose Intolerance (Gi) Diarrhea and Nausea And Vomiting  . Linaclotide Other (See Comments)    Caused spastic bladder (reaction to Linzess)  . Other     Pt does not use artificial sweeteners     Antimicrobials this admission: Vanc 6/9>> Zosyn 6/9>>  Dose adjustments this admission: Adjusted load of vancomycin  for pyxis availability at [facility]. Confirmed doxycycline is in pyxis if needed.   Microbiology results: 6/9 Bcx:   6/9 [place] spotted fever:   Jalene Mullet, Pharm.D. PGY1 Pharmacy Resident 09/16/2017 11:06 AM Phone: [phone]

## 2017-09-16 NOTE — ED Provider Notes (Addendum)
[facility] EMERGENCY DEPARTMENT Provider Note   CSN: [id number] Arrival date & time: 09/16/17  2641     History   Chief Complaint Chief Complaint  Patient presents with  . Fever    HPI Katelyn Mcclain is a 67 y.o. female.  HPI   Patient is a 67 year old female with a history of celiac disease, gastroparesis, GERD, IBS, who presents the emergency department today to be evaluated for a fever which has been present for the last week.  Patient states that she began to have a sore throat and rhinorrhea 1 week ago.  She also started to have a dry cough as well 1 week ago.  States she has had pneumonia in the past but this feels different than that.  She also reports associated nausea, intermittent vomiting, decreased appetite,  and decreased p.o. Intake.  Denies sore throat, ear pain, headaches, dizziness, vision changes, chest pain, shortness of breath, dysuria, urinary frequency, hematuria, rashes.  Notes that she has been outside a lot over the last 2 weeks and has had found multiple ticks on her body. She took 500mg  tylenol at 6:30AM this morning.  She has a history of chronic right-sided abdominal pain which she states is unchanged today.  She currently sees Dr. Tally Joe with GI and has been diagnosed with bacterial overgrowth syndrome.  She states that he has been treating her with Bactrim daily BID since April.  On review of records she had MR of abdomen on 12/2016 which showed no acute abdominal process.  Also had MR enterography on 08/30/2017 which showed normal caliber small bowel without evidence of obstruction or inflammation.  Moderate to large amount of stool in the right colon and a small hiatal hernia.  Otherwise was negative for acute process.  Patient also states she was seen by her PCP 4 days ago and was started on Levaquin.  She has had no improvement of her symptoms since beginning this medication.  Past Medical History:  Diagnosis Date  . Abscessed tooth   .  Allergic rhinitis   . Asthma   . Asthma    inhaler prn  . Celiac disease   . Depression with anxiety   . Dysmenorrhea   . Gastroparesis   . GERD (gastroesophageal reflux disease)   . Hx of low back pain   . IBS (irritable bowel syndrome)   . Migraine   . Migraine without aura, with intractable migraine, so stated, without mention of status migrainosus 05/20/2013  . Osteopenia     Patient Active Problem List   Diagnosis Date Noted  . Sepsis ([place]) 09/16/2017  . Chest pain 06/15/2013  . Intractable migraine without aura 05/20/2013    Past Surgical History:  Procedure Laterality Date  . ABDOMINAL SURGERY    . BREAST BIOPSY Right    x2  . BREAST CYST ASPIRATION  1999  . COLONOSCOPY    . FOOT SURGERY Left    Bunionectomy  . MASS EXCISION  08/08/2011   Procedure: MINOR EXCISION OF MASS;  Surgeon: Cammie Sickle., MD;  Location: [facility];  Service: Orthopedics;  Laterality: Left;  left ring excision cyst proximal phalangeal joint  . NISSEN FUNDOPLICATION    . ROTATOR CUFF REPAIR Right 05/2015  . TONSILLECTOMY       OB History   None      Home Medications    Prior to Admission medications   Medication Sig Start Date End Date Taking? Authorizing Provider  diclofenac sodium (  VOLTAREN) 1 % GEL Apply 1/2 inch to painful area once a day 07/27/17  Yes [provider]  acetaminophen (TYLENOL) 500 MG tablet Take 500 mg by mouth every 6 (six) hours as needed (pain).    [provider]  albuterol (PROVENTIL HFA;VENTOLIN HFA) 108 (90 BASE) MCG/ACT inhaler Inhale 1 puff into the lungs every 6 (six) hours as needed for wheezing or shortness of breath (seasonal allergies).     [provider]  b complex vitamins tablet Take 1 tablet by mouth daily.    [provider]  Calcium-Magnesium-Vitamin D (CALCIUM 1200+D3 PO) Take 1,200 mg by mouth daily.    [provider]  cycloSPORINE (RESTASIS) 0.05 % ophthalmic emulsion 1 drop.     [provider]  diazepam (VALIUM) 10 MG tablet INSERT 1 TABLET VAGINALLY EVERY NIGHT AT BEDTIME AS DIRECTED 08/17/17   [provider]  dicyclomine (BENTYL) 20 MG tablet Take 20 mg by mouth 2 (two) times daily as needed for spasms (stomach pain).  06/26/16   [provider]  Estrogens, Conjugated (PREMARIN VA) Place vaginally.    [provider]  Eszopiclone (ESZOPICLONE) 3 MG TABS Take 1.5 mg by mouth at bedtime as needed (sleep). Take immediately before bedtime     [provider]  Garlic (GARLIQUE PO) Take 1 capsule by mouth daily.     [provider]  ketorolac (TORADOL) 10 MG tablet Take by mouth.    [provider]  levofloxacin (LEVAQUIN) 500 MG tablet  09/13/17   [provider]  meloxicam (MOBIC) 15 MG tablet Take 15 mg by mouth daily. 08/17/17   [provider]  Miconazole Nitrate (MONISTAT 7 VA) Place 1 application vaginally at bedtime as needed (yeast infection).    [provider]  montelukast (SINGULAIR) 10 MG tablet Take 10 mg by mouth at bedtime.  11/03/13   [provider]  Multiple Vitamins-Minerals (PRESERVISION AREDS 2) CAPS Take 1 capsule by mouth daily.     [provider]  Omega-3 Fatty Acids (OMEGA 3 PO) Take 1,280 mg by mouth daily.    [provider]  omeprazole (PRILOSEC) 40 MG capsule Take 40 mg by mouth daily. 10/10/16   [provider]  ondansetron (ZOFRAN) 4 MG tablet TAKE 1 TABLET BY MOUTH EVERY 6 HOURS AS NEEDED FOR NAUSEA - IF PHERERGAN NOT WORKING 07/30/17   [provider]  OVER THE COUNTER MEDICATION Take 1 capsule by mouth 2 (two) times daily. IBGard    [provider]  OVER THE COUNTER MEDICATION Take 1 capsule by mouth 2 (two) times daily. FDGard    [provider]  OVER THE COUNTER MEDICATION Take 500 mg by mouth daily. Vitamin B5    [provider]  OVER THE COUNTER MEDICATION Take 500 mg by mouth daily.  Berberine    [provider]  polyethylene glycol (MIRALAX / GLYCOLAX) packet Take 17 g by mouth at bedtime as needed (constipation). Mix in 8 oz liquid and drink     [provider]  Probiotic Product (PROBIOTIC DAILY PO) Take 1 tablet by mouth 2 (two) times daily.     [provider]  promethazine (PHENERGAN) 25 MG tablet Take 1 tablet (25 mg total) by mouth every 6 (six) hours as needed for nausea or vomiting. 11/30/15   Lawyer, Harrell Gave, PA-C  pyridoxine (B-6) 200 MG tablet Take 200 mg by mouth daily.    [provider]  rizatriptan (MAXALT-MLT) 10 MG disintegrating tablet  Take 1 tablet PO at the onset of migraine and May repeat in 2 hours if needed. Not to exceed 2 tabs/24 hours. 01/11/17   Kathrynn Ducking, MD  sulfamethoxazole-trimethoprim (BACTRIM,SEPTRA) 400-80 MG tablet Take 1 tablet by mouth 2 (two) times daily. 08/26/17   [provider]  vitamin C (ASCORBIC ACID) 500 MG tablet Take 500 mg by mouth 2 (two) times daily.    [provider]  vortioxetine HBr (TRINTELLIX) 20 MG TABS Take 20 mg by mouth at bedtime.    [provider]  zonisamide (ZONEGRAN) 100 MG capsule Take 1 capsule (100 mg total) by mouth 2 (two) times daily. 07/23/17   Ward Givens, NP  zonisamide (ZONEGRAN) 25 MG capsule TAKE 2 CAPSULE EVERY EVENING WITH 100 MG CAPSULE TO MAKE 150 MG NIGHTLY DOSE. 07/23/17   Ward Givens, NP    Family History Family History  Problem Relation Age of Onset  . Stroke Father   . Dementia Father   . Atrial fibrillation Father   . Cancer Father   . Hypertension Mother   . Scoliosis Mother   . Heart disease Sister        Congenital heart disease  . Migraines Neg Hx     Social History Social History   Tobacco Use  . Smoking status: Former Research scientist (life sciences)  . Smokeless tobacco: Never Used  . Tobacco comment: IN college 1 year  Substance Use Topics  . Alcohol use: Yes    Alcohol/week: 0.0 oz    Comment: wine  occasionally  . Drug use: No     Allergies   Adhesive [tape]; Morphine and related; Clonazepam; Lactose intolerance (gi); Linaclotide; and Other   Review of Systems Review of Systems  Constitutional: Positive for appetite change, fatigue and fever.  HENT: Positive for rhinorrhea and sore throat. Negative for congestion, ear pain, sinus pressure and sinus pain.   Eyes: Negative for visual disturbance.  Respiratory: Positive for cough. Negative for shortness of breath and wheezing.   Cardiovascular: Negative for chest pain and leg swelling.  Gastrointestinal: Positive for abdominal pain (chronic, unchanged), nausea and vomiting. Negative for blood in stool, constipation and diarrhea.  Genitourinary: Negative for dysuria, flank pain, frequency, hematuria and urgency.  Musculoskeletal: Positive for myalgias.  Skin: Negative for rash.  Neurological: Positive for light-headedness. Negative for dizziness, weakness, numbness and headaches.   Physical Exam Updated Vital Signs BP (!) 84/49   Pulse 89   Temp 99.2 F (37.3 C) (Oral)   Resp 17   Ht 5\' 10"  (1.778 m)   Wt 64.9 kg (143 lb)   SpO2 100%   BMI 20.52 kg/m   Physical Exam  Constitutional: She is oriented to person, place, and time. She appears well-developed and well-nourished. No distress.  Rigoring  HENT:  Head: Normocephalic and atraumatic.  Right Ear: External ear normal.  Left Ear: External ear normal.  Left TM normal.  Right TM with effusion but no erythema.  No pharyngeal erythema.  No tonsillar swelling or exudates.  No rhinorrhea.  Eyes: Pupils are equal, round, and reactive to light. Conjunctivae and EOM are normal.  Neck: Normal range of motion. Neck supple.  No nuchal rigidity.  Cardiovascular: Normal rate, regular rhythm and normal heart sounds.  No murmur heard. Pulmonary/Chest: Effort normal and breath sounds normal. No stridor. No respiratory distress. She has no wheezes. She has no rales.  Abdominal:  Soft. Bowel sounds are normal. She exhibits no distension and no mass. There is no tenderness. There  is no guarding.  Musculoskeletal: She exhibits no edema.  Lymphadenopathy:    She has no cervical adenopathy.  Neurological: She is alert and oriented to person, place, and time.  Moving all extremities.  Skin: Skin is warm and dry. Capillary refill takes less than 2 seconds.  Psychiatric: She has a normal mood and affect.  Nursing note and vitals reviewed.  ED Treatments / Results  Labs (all labs ordered are listed, but only abnormal results are displayed) Labs Reviewed  COMPREHENSIVE METABOLIC PANEL - Abnormal; Notable for the following components:      Result Value   Sodium 129 (*)    Chloride 99 (*)    CO2 21 (*)    Glucose, Bld 140 (*)    BUN 25 (*)    Creatinine, Ser 1.34 (*)    Calcium 8.2 (*)    AST 52 (*)    GFR calc non Af Amer 41 (*)    GFR calc Af Amer 47 (*)    All other components within normal limits  CBC WITH DIFFERENTIAL/PLATELET - Abnormal; Notable for the following components:   RBC 3.75 (*)    Hemoglobin 11.9 (*)    HCT 33.8 (*)    Platelets 72 (*)    All other components within normal limits  URINALYSIS, ROUTINE W REFLEX MICROSCOPIC - Abnormal; Notable for the following components:   Protein, ur 30 (*)    All other components within normal limits  URINALYSIS, MICROSCOPIC (REFLEX) - Abnormal; Notable for the following components:   Bacteria, UA MANY (*)    All other components within normal limits  I-STAT CG4 LACTIC ACID, ED - Abnormal; Notable for the following components:   Lactic Acid, Venous 2.14 (*)    All other components within normal limits  I-STAT CG4 LACTIC ACID, ED - Abnormal; Notable for the following components:   Lactic Acid, Venous 2.67 (*)    All other components within normal limits  URINE CULTURE  CULTURE, BLOOD (ROUTINE X 2)  CULTURE, BLOOD (ROUTINE X 2)  LIPASE, BLOOD  TROPONIN I  ROCKY MTN SPOTTED FVR ABS PNL(IGG+IGM)    PATHOLOGIST SMEAR REVIEW    EKG EKG Interpretation  Date/Time:  Sunday September 16 2017 10:15:06 EDT Ventricular Rate:  96 PR Interval:    QRS Duration: 97 QT Interval:  352 QTC Calculation: 445 R Axis:   75 Text Interpretation:  Sinus rhythm Low voltage, precordial leads No old tracing to compare Confirmed by Dorie Rank [phone]) on 09/16/2017 10:31:30 AM   Radiology Dg Chest 2 View  Result Date: 09/16/2017 CLINICAL DATA:  Cough and fever EXAM: CHEST - 2 VIEW COMPARISON:  December 07, 2015 FINDINGS: There is no edema or consolidation. The heart size and pulmonary vascularity are normal. No adenopathy. No evident bone lesions. IMPRESSION: No edema or consolidation. Electronically Signed   By: Lowella Grip III M.D.   On: 09/16/2017 10:57    Procedures Procedures (including critical care time) CRITICAL CARE Performed by: Rodney Booze   Total critical care time: 35 minutes  Critical care time was exclusive of separately billable procedures and treating other patients.  Critical care was necessary to treat or prevent imminent or life-threatening deterioration.  Critical care was time spent personally by me on the following activities: development of treatment plan with patient and/or surrogate as well as nursing, discussions with consultants, evaluation of patient's response to treatment, examination of patient, obtaining history from patient or surrogate, ordering and performing treatments and interventions, ordering and  review of laboratory studies, ordering and review of radiographic studies, pulse oximetry and re-evaluation of patient's condition.  Medications Ordered in ED Medications  piperacillin-tazobactam (ZOSYN) IVPB 3.375 g (has no administration in time range)  vancomycin (VANCOCIN) IVPB 1000 mg/200 mL premix (has no administration in time range)  vancomycin (VANCOCIN) 500 MG powder (has no administration in time range)  sodium chloride 0.9 % bolus 1,000 mL (0 mLs  Intravenous Stopped 09/16/17 1030)  sodium chloride 0.9 % bolus 1,000 mL (0 mLs Intravenous Stopped 09/16/17 1118)  piperacillin-tazobactam (ZOSYN) IVPB 3.375 g (0 g Intravenous Stopped 09/16/17 1118)  acetaminophen (TYLENOL) tablet 500 mg (500 mg Oral Given 09/16/17 1054)  ondansetron (ZOFRAN) injection 4 mg (4 mg Intravenous Given 09/16/17 1053)  vancomycin (VANCOCIN) 1,500 mg in sodium chloride 0.9 % 500 mL IVPB (0 mg Intravenous Stopped 09/16/17 1340)  doxycycline (VIBRA-TABS) tablet 100 mg (100 mg Oral Given 09/16/17 1126)  vancomycin (VANCOCIN) 1000 MG powder (1,500 mg  Given 09/16/17 1120)  sodium chloride 0.9 % bolus 1,000 mL (0 mLs Intravenous Stopped 09/16/17 1347)  acetaminophen (TYLENOL) tablet 500 mg (500 mg Oral Given 09/16/17 1312)  sodium chloride 0.9 % bolus 1,000 mL (1,000 mLs Intravenous Transfusing/Transfer 09/16/17 1405)     Initial Impression / Assessment and Plan / ED Course  I have reviewed the triage vital signs and the nursing notes.  Pertinent labs & imaging results that were available during my care of the patient were reviewed by me and considered in my medical decision making (see chart for details).   11:15 PM Re-evaluated pt. is lying in bed and is still regulating.  Heart rate 101.  Respirations 19.  O2 sat 98% on room air.  BP has improved to 105/60.  Second liter of fluid still going.  1:52 PM Re-evaluated pt. Lying in bed. Rigors. HR 95. Satting at 96% on RA. Normal respirations. BP 86 systolic.  Final Clinical Impressions(s) / ED Diagnoses   Final diagnoses:  Sepsis, due to unspecified organism ([place])  AKI (acute kidney injury) ([place])  Thrombocytopenia ([place])  Hyponatremia   Patient presenting with fever for 1 week.  Presents today with fever to 102.2, elevated heart rate to 97, hypotension with BP 31S to 97W systolic.  Normal respirations and O2 sats.   Cardiopulmonary exam is grossly normal.  Abdominal exam is benign.  Denies urinary symptoms. Normal neurologic  examination and no meningeal signs. Currently being treated with bactrim by her gastroenterologist for bacterial overgrowth syndrome. Also started on Levaquin 4 days ago by her PCP. She has had no improvement of her fevers despite being on these antibiotics.   CBC with no leukocytosis, does have thrombocytopenia. CMP with hyponatremia, mild AKI with elevated Cr to 1.34 from 1.07. BUN 25. AST 52. Lipase, trop negative. Lactic elevated to 2.14, repeat lactic increased to 2.67.  CXR negative for pneumonia. ECG with NSR, HR 96. Low voltage in precordial leads. No ischemic changes.  UA without evidence of UTI.  Blood cultures pending.  Code sepsis initiated an empiric abx initiated for unknown source. RMSF titers pending due to concern for tick bourne illness as no other source has been identified and pt has multiple known recent tick exposures. She has also failed outpatient tx with bactrim and levaquin. Pt received vancomycin, zosyn, and doxycycline in the ED as well at 3L fluid. Additional fluid bolus ordered prior to transfer.   1:01 PM CONSULT with Dr. Prudence Davidson with hospitalist service who will admit the patient to the hospitalist service  in step down bed. She will be transferred to [facility] for admission.   ED Discharge Orders    None          Bishop Dublin 09/16/17 1511    Dorie Rank, MD 09/17/17 [phone]

## 2017-09-17 ENCOUNTER — Inpatient Hospital Stay (HOSPITAL_COMMUNITY): Payer: Medicare Other

## 2017-09-17 ENCOUNTER — Encounter (HOSPITAL_COMMUNITY): Payer: Self-pay

## 2017-09-17 DIAGNOSIS — K6389 Other specified diseases of intestine: Secondary | ICD-10-CM

## 2017-09-17 DIAGNOSIS — R945 Abnormal results of liver function studies: Secondary | ICD-10-CM

## 2017-09-17 LAB — COMPREHENSIVE METABOLIC PANEL
ALBUMIN: 2.6 g/dL — AB (ref 3.5–5.0)
ALT: 62 U/L — ABNORMAL HIGH (ref 14–54)
AST: 101 U/L — AB (ref 15–41)
Alkaline Phosphatase: 70 U/L (ref 38–126)
Anion gap: 5 (ref 5–15)
BUN: 14 mg/dL (ref 6–20)
CHLORIDE: 113 mmol/L — AB (ref 101–111)
CO2: 18 mmol/L — ABNORMAL LOW (ref 22–32)
Calcium: 7.3 mg/dL — ABNORMAL LOW (ref 8.9–10.3)
Creatinine, Ser: 0.89 mg/dL (ref 0.44–1.00)
GFR calc Af Amer: >60 mL/min (ref >60)
GFR calc non Af Amer: >60 mL/min (ref >60)
GLUCOSE: 106 mg/dL — AB (ref 65–99)
Potassium: 3.2 mmol/L — ABNORMAL LOW (ref 3.5–5.1)
Sodium: 136 mmol/L (ref 135–145)
Total Bilirubin: 0.2 mg/dL — ABNORMAL LOW (ref 0.3–1.2)
Total Protein: 5.1 g/dL — ABNORMAL LOW (ref 6.5–8.1)

## 2017-09-17 LAB — GASTROINTESTINAL PANEL BY PCR, STOOL (REPLACES STOOL CULTURE)
Adenovirus F40/41: NOT DETECTED
Astrovirus: NOT DETECTED
CYCLOSPORA CAYETANENSIS: NOT DETECTED
Campylobacter species: NOT DETECTED
Cryptosporidium: NOT DETECTED
ENTAMOEBA HISTOLYTICA: NOT DETECTED
ENTEROAGGREGATIVE E COLI (EAEC): NOT DETECTED
Enteropathogenic E coli (EPEC): NOT DETECTED
Enterotoxigenic E coli (ETEC): NOT DETECTED
GIARDIA LAMBLIA: NOT DETECTED
Norovirus GI/GII: NOT DETECTED
Plesimonas shigelloides: NOT DETECTED
Rotavirus A: NOT DETECTED
SALMONELLA SPECIES: NOT DETECTED
SHIGELLA/ENTEROINVASIVE E COLI (EIEC): NOT DETECTED
Sapovirus (I, II, IV, and V): NOT DETECTED
Shiga like toxin producing E coli (STEC): NOT DETECTED
VIBRIO CHOLERAE: NOT DETECTED
VIBRIO SPECIES: NOT DETECTED
Yersinia enterocolitica: NOT DETECTED

## 2017-09-17 LAB — C DIFFICILE QUICK SCREEN W PCR REFLEX
C DIFFICLE (CDIFF) ANTIGEN: NEGATIVE
C Diff interpretation: NOT DETECTED
C Diff toxin: NEGATIVE

## 2017-09-17 LAB — CBC
HEMATOCRIT: 28 % — AB (ref 36.0–46.0)
HEMOGLOBIN: 9.8 g/dL — AB (ref 12.0–15.0)
MCH: 32 pg (ref 26.0–34.0)
MCHC: 35 g/dL (ref 30.0–36.0)
MCV: 91.5 fL (ref 78.0–100.0)
Platelets: 75 10*3/uL — ABNORMAL LOW (ref 150–400)
RBC: 3.06 MIL/uL — ABNORMAL LOW (ref 3.87–5.11)
RDW: 14.1 % (ref 11.5–15.5)
WBC: 5.4 10*3/uL (ref 4.0–10.5)

## 2017-09-17 LAB — URINE CULTURE: CULTURE: NO GROWTH

## 2017-09-17 LAB — MRSA PCR SCREENING: MRSA by PCR: NEGATIVE

## 2017-09-17 LAB — PATHOLOGIST SMEAR REVIEW

## 2017-09-17 LAB — LACTIC ACID, PLASMA: LACTIC ACID, VENOUS: 1.6 mmol/L (ref 0.5–1.9)

## 2017-09-17 MED ORDER — METOCLOPRAMIDE HCL 5 MG/ML IJ SOLN: 10.0000 mg | Freq: Four times a day (QID) | INTRAMUSCULAR | Status: DC | PRN

## 2017-09-17 MED ORDER — POTASSIUM CHLORIDE CRYS ER 20 MEQ PO TBCR
40.0000 meq | EXTENDED_RELEASE_TABLET | Freq: Once | ORAL | Status: AC
  Administered 2017-09-17: 20 meq via ORAL
  Filled 2017-09-17 (×2): qty 2

## 2017-09-17 MED ORDER — ZONISAMIDE 100 MG PO CAPS
100.0000 mg | ORAL_CAPSULE | Freq: Two times a day (BID) | ORAL | Status: DC
  Administered 2017-09-17 – 2017-09-18 (×2): 100 mg via ORAL
  Filled 2017-09-17 (×2): qty 1

## 2017-09-17 MED ORDER — SODIUM CHLORIDE 0.9 % IV SOLN
100.0000 mg | Freq: Two times a day (BID) | INTRAVENOUS | Status: DC
  Administered 2017-09-17 – 2017-09-19 (×4): 100 mg via INTRAVENOUS
  Filled 2017-09-17 (×5): qty 100

## 2017-09-17 MED ORDER — VANCOMYCIN HCL 10 G IV SOLR
1250.0000 mg | INTRAVENOUS | Status: DC
  Administered 2017-09-17: 1250 mg via INTRAVENOUS
  Filled 2017-09-17: qty 1250

## 2017-09-17 MED ORDER — ONDANSETRON HCL 4 MG/2ML IJ SOLN
4.0000 mg | Freq: Four times a day (QID) | INTRAMUSCULAR | Status: DC | PRN
Start: 2017-09-17 — End: 2017-09-19
  Administered 2017-09-17: 4 mg via INTRAVENOUS
  Filled 2017-09-17: qty 2

## 2017-09-17 MED ORDER — POTASSIUM CHLORIDE CRYS ER 20 MEQ PO TBCR
40.0000 meq | EXTENDED_RELEASE_TABLET | Freq: Once | ORAL | Status: AC
  Administered 2017-09-17: 20 meq via ORAL
  Filled 2017-09-17: qty 2

## 2017-09-17 MED ORDER — POTASSIUM CHLORIDE 10 MEQ/100ML IV SOLN
10.0000 meq | INTRAVENOUS | Status: AC
  Administered 2017-09-17 (×2): 10 meq via INTRAVENOUS
  Filled 2017-09-17 (×2): qty 100

## 2017-09-17 NOTE — Progress Notes (Signed)
Name: Katelyn Mcclain MRN: [id number] DOB: 12/16/1950    ADMISSION DATE:  09/16/2017 CONSULTATION DATE:  09/17/2017  REFERRING MD :  Tawanna Solo, triad  CHIEF COMPLAINT:  hypotension    HISTORY OF PRESENT ILLNESS: 67 year old woman with a history of bacterial overgrowth syndrome presented to [facility] emergency room with fever for 7 days and hypotension.  She was started on empiric Levaquin by her PCP. She has a history of bacterial overgrowth syndrome and follows with gastroenterology at [facility] I have reviewed notes in care everywhere including recent MRI that showed small hiatal hernia but no evidence of distended bowel.  She has recently been treated with Bactrim for this.  She also has a history of tick bites.  Blood pressure was in the 80s on arrival to [facility] emergency room with lactate of 2.1, she was given 3 L of IV fluids and transferred to [facility] long  ICU.  Blood pressure remained in the low 90s to 80s, repeat lactate was 2.7 hence we are consulted  Labs did not show any leukocytosis, creatinine was 1.3, chest x-ray did not show any infiltrates UA was negative for nitrite and leukoesterase but microscopy showed many bacteria with WBC casts She has received broad-spectrum antibiotics  SUBJECTIVE:  Feeling a bit better although slightly pan pos ROS  Weaning pressors - on 58mcg Neo  VITAL SIGNS: Temp:  [98.5 F (36.9 C)-102.2 F (39 C)] 98.7 F (37.1 C) (06/10 0740) Pulse Rate:  [71-103] 80 (06/10 0900) Resp:  [9-26] 18 (06/10 0900) BP: (80-149)/(41-77) 103/55 (06/10 0900) SpO2:  [92 %-100 %] 95 % (06/10 0900) Weight:  [64.9 kg (143 lb)-66.4 kg (146 lb 6.2 oz)] 66.4 kg (146 lb 6.2 oz) (06/09 1516)  PHYSICAL EXAMINATION: Gen. pleasant female, NAD  ENT - mm moist, no JVD, no thrush Neck: No JVD, no thyromegaly, no carotid bruits Lungs: resps even non labored on RA, clear Cardiovascular: s1s2 rrr  Abdomen: soft, non tender, + bs  Musculoskeletal: no edema    Neuro:  Awake, alert, appropriate    Recent Labs  Lab 09/16/17 0954 09/17/17 0331  NA 129* 136  K 3.5 3.2*  CL 99* 113*  CO2 21* 18*  BUN 25* 14  CREATININE 1.34* 0.89  GLUCOSE 140* 106*   Recent Labs  Lab 09/16/17 0954 09/17/17 0331  HGB 11.9* 9.8*  HCT 33.8* 28.0*  WBC 6.3 5.4  PLT 72* 75*   Dg Chest 2 View  Result Date: 09/16/2017 CLINICAL DATA:  Cough and fever EXAM: CHEST - 2 VIEW COMPARISON:  December 07, 2015 FINDINGS: There is no edema or consolidation. The heart size and pulmonary vascularity are normal. No adenopathy. No evident bone lesions. IMPRESSION: No edema or consolidation. Electronically Signed   By: Lowella Grip III M.D.   On: 09/16/2017 10:57    ASSESSMENT / PLAN:  Sepsis - improving.  Weaning pressors, nearly off.  Source not clear, concern for WBC clumps and UA and history of bacterial overgrowth syndrome.  No evidence of endocarditis. Doubt concern for obstructive uropathy since her recent MRI showed normal kidneys.  Of note she was on Levaquin as outpatient.  Mental status wnl, no other evidence end organ failure. Very mild AKI is resolved.    PLAN -  Continue to wean pressors to off as able for SBP 90, MAP 65  Continue broad spectrum abx as above  abd u/s pending  Pan cultures including stool pending   AKI - resolved.  PLAN -  F.u chem   Mild hyponatremia and thrombocytopenia noted and being addressed by primary team. History of tick bites also noted.  Does not appear to have a unifying diagnosis at present but remain vigilant   Looks great other than minimal pressor requirement.  AKI resolved.  Once weaned off whiff of neo can tx out of ICU.    Nickolas Madrid, NP 09/17/2017  9:30 AM Pager: [phone] or [phone]

## 2017-09-17 NOTE — Progress Notes (Signed)
[facility] Progress Note Patient Name: Katelyn Mcclain DOB: 11/11/1950 MRN: [id number]   Date of Service  09/17/2017  HPI/Events of Note  K+ = 3.2 and Creatinine = 0.89.  eICU Interventions  Will replace K+.     Intervention Category Major Interventions: Electrolyte abnormality - evaluation and management  Sommer,Steven Eugene 09/17/2017, 6:15 AM

## 2017-09-17 NOTE — Progress Notes (Signed)
Pharmacy Antibiotic Note  Katelyn Mcclain is a 67 y.o. female admitted on 09/16/2017 with sepsis.  Pharmacy has been consulted for vancomycin + piperacillin/tazobactam dosing.  Patient transferred from [facility] - confirmed with MD on admission that vancomycin, piperacillin/tazobactam, and doxycycline are to be continued.  Pt received vancomycin 1500 mg LD + pip/tazo 3.375 g IV and doxycycline 100 mg once prior to transfer.   Pt presenting with a history of dry cough, sore throat, rhinorrhea. Recently prescribed SMX/TMP and levofloxacin outpatient. Antibiotics started for sepsis unknown origin. Doxycycline started - RMSF titers pending.   Plan:  Piperacillin/tazobactam 3.375 g IV q8h EI  Vancomycin 1500 mg LD and adjust the maintenance dose to 1250 mg IV q24h with improvement in renal function   Goal AUC 400-500  Follow SCr and vancomycin levels at steady state  Height: 5' 10.5" (179.1 cm) Weight: 146 lb 6.2 oz (66.4 kg) IBW/kg (Calculated) : 69.65  Temp (24hrs), Avg:99.9 F (37.7 C), Min:98.5 F (36.9 C), Max:102.2 F (39 C)  Recent Labs  Lab 09/16/17 0954 09/16/17 0957 09/16/17 1158 09/16/17 1620 09/17/17 0331  WBC 6.3  --   --   --  5.4  CREATININE 1.34*  --   --   --  0.89  LATICACIDVEN  --  2.14* 2.67* 2.3* 1.6    Estimated Creatinine Clearance: 66.1 mL/min (by C-G formula based on SCr of 0.89 mg/dL).    Allergies  Allergen Reactions  . Adhesive [Tape] Other (See Comments)    Tear's skin off   . Morphine And Related Other (See Comments)    Sensitive to narcotics, worsening migraines.  . Clonazepam Other (See Comments)    Caused anxiety  . Lactose Intolerance (Gi) Diarrhea and Nausea And Vomiting  . Linaclotide Other (See Comments)    Caused spastic bladder (reaction to Linzess)  . Other     Pt does not use artificial sweeteners     Antimicrobials this admission: Vancomycin 6/9 >> Piperacillin/tazobactam 6/9 >>  Doxycycline 6/9 >>  Dose  adjustments this admission:  Microbiology:  6/9 Bcx:  IP 6/9 [place] spotted fever: IP 6/9 HIV antibody: IP  6/9 B. [place]dor[place] antibodies: IP 6/10 C.diff antigen:   Thank you for allowing pharmacy to be a part of this patient's care.  Royetta Asal, PharmD, BCPS Pager [phone] 09/17/2017 10:21 AM

## 2017-09-17 NOTE — Progress Notes (Signed)
C. Diff and GI panel came back negative. Precautions discontinued.

## 2017-09-17 NOTE — Progress Notes (Signed)
Patient ID: Katelyn Mcclain, female   DOB: May 18, 1950, 67 y.o.   MRN: [id number]  PROGRESS NOTE    Katelyn Mcclain  IBB:[id number] DOB: 1950-08-06 DOA: 09/16/2017 PCP: Jani Gravel, MD   Brief Narrative:  67 year old female with history of celiac disease, bacterial overgrowth syndrome, GERD, migraine headaches who was recently treated with Bactrim for bacterial overgrowth syndrome by her gastroenterologist at [facility] and also subsequently recently treated by her PCP with Levaquin for fever presented on 09/16/2017 with fever for last 1 week.  Patient was found to be hypotensive and was started on broad-spectrum antibiotics including doxycycline for history of tick bites.  She also was started on pressors and was admitted to ICU.   Assessment & Plan:   Principal Problem:   Severe sepsis with septic shock (HCC) Active Problems:   Celiac disease   Bacterial overgrowth syndrome   Tick bites   AKI (acute kidney injury) ([place])   Hyponatremia   Thrombocytopenia (HCC)   Septic shock (HCC)   Septic shock with fever/hypotension/lactic acidosis -Questionable cause.  No source of infection found yet.  Unclear if this is bacterial versus viral.  Currently on a small dose of vasopressor.  Blood pressure improving.  Critical care following, planning to wean off vasopressor.  Follow cultures -Currently on broad-spectrum antibiotics including vancomycin Zosyn and doxycycline.  Doxycycline is for probable tickborne disease as patient has history of multiple tick bites.  [facility] spotted fever and Lyme serology pending. -Currently on normal saline at 125 cc an hour: Will decrease normal saline to 75 cc an hour -Had loose bowel movements on presentation and overnight.  Will check stool for C. difficile along with gastrointestinal PCR  Acute kidney injury -Probably secondary to above. -Repeat a.m. labs  Thrombocytopenia -Probably secondary to above.  Mildly improved.  Repeat a.m. labs.  No signs  of bleeding  Hyponatremia -Probably secondary to poor oral intake.  Improving.  Repeat a.m. labs.  IV fluid plan as above  History of tick bites -Patient has history of multiple tick bites which is not a new thing for [place]  No evidence of rash or purpura.  Doubt that patient has [facility] spotted fever or Lyme's disease.  Await serologies.  History of celiac disease/bacterial overgrowth syndrome -Recently treated with Bactrim for bacterial overgrowth syndrome -Outpatient follow-up with gastroenterologist at [facility]  Hypokalemia -Replace.  Repeat a.m. labs  Elevated LFTs -Probably secondary to sepsis.  Will get hepatitis panel in a.m.  Repeat a.m. LFTs.  Right upper quadrant ultrasound.   DVT prophylaxis: SCDs Code Status: Partial Family Communication: None at bedside Disposition Plan: Depends on clinical outcome  Consultants: PCCM  Procedures: None  Antimicrobials: Vancomycin/Zosyn and doxycycline from 09/16/2017 onwards   Subjective: Patient seen and examined at bedside.  Currently feels slightly better but is still weak.  Had overnight diarrhea with intermittent nausea and vomiting.  No current chest pain, worsening shortness of breath.  Objective: Vitals:   09/17/17 0810 09/17/17 0820 09/17/17 0900 09/17/17 1005  BP: 114/62 (!) 105/55 (!) 103/55 (!) 96/52  Pulse: 75 76 80 75  Resp: 20 20 18 15   Temp:      TempSrc:      SpO2: 93% 93% 95% 94%  Weight:      Height:        Intake/Output Summary (Last 24 hours) at 09/17/2017 1035 Last data filed at 09/17/2017 0600 Gross per 24 hour  Intake 6296.06 ml  Output 100 ml  Net 6196.06 ml   Filed Weights   09/16/17 0941 09/16/17 1516  Weight: 64.9 kg (143 lb) 66.4 kg (146 lb 6.2 oz)    Examination:  General exam: Appears calm and comfortable.  No acute distress Respiratory system: Bilateral decreased breath sound at bases Cardiovascular system: S1 & S2 heard, rate  controlled.  Gastrointestinal system: Abdomen is nondistended, soft and mildly tender in the right upper quadrant. Normal bowel sounds heard. Central nervous system: Alert and oriented. No focal neurological deficits. Moving extremities Extremities: No cyanosis, clubbing, edema  Skin: No rashes, lesions or ulcers Psychiatry: Judgement and insight appear normal. Mood & affect appropriate.     Data Reviewed: I have personally reviewed following labs and imaging studies  CBC: Recent Labs  Lab 09/16/17 0954 09/17/17 0331  WBC 6.3 5.4  NEUTROABS 5.4  --   HGB 11.9* 9.8*  HCT 33.8* 28.0*  MCV 90.1 91.5  PLT 72* 75*   Basic Metabolic Panel: Recent Labs  Lab 09/16/17 0954 09/17/17 0331  NA 129* 136  K 3.5 3.2*  CL 99* 113*  CO2 21* 18*  GLUCOSE 140* 106*  BUN 25* 14  CREATININE 1.34* 0.89  CALCIUM 8.2* 7.3*   GFR: Estimated Creatinine Clearance: 66.1 mL/min (by C-G formula based on SCr of 0.89 mg/dL). Liver Function Tests: Recent Labs  Lab 09/16/17 0954 09/17/17 0331  AST 52* 101*  ALT 33 62*  ALKPHOS 63 70  BILITOT 0.4 0.2*  PROT 6.8 5.1*  ALBUMIN 3.5 2.6*   Recent Labs  Lab 09/16/17 0954  LIPASE 26   No results for input(s): AMMONIA in the last 168 hours. Coagulation Profile: No results for input(s): INR, PROTIME in the last 168 hours. Cardiac Enzymes: Recent Labs  Lab 09/16/17 0954  TROPONINI <0.03   BNP (last 3 results) No results for input(s): PROBNP in the last 8760 hours. HbA1C: No results for input(s): HGBA1C in the last 72 hours. CBG: No results for input(s): GLUCAP in the last 168 hours. Lipid Profile: No results for input(s): CHOL, HDL, LDLCALC, TRIG, CHOLHDL, LDLDIRECT in the last 72 hours. Thyroid Function Tests: No results for input(s): TSH, T4TOTAL, FREET4, T3FREE, THYROIDAB in the last 72 hours. Anemia Panel: No results for input(s): VITAMINB12, FOLATE, FERRITIN, TIBC, IRON, RETICCTPCT in the last 72 hours. Sepsis Labs: Recent Labs   Lab 09/16/17 0957 09/16/17 1158 09/16/17 1620 09/17/17 0331  PROCALCITON  --   --  0.98  --   LATICACIDVEN 2.14* 2.67* 2.3* 1.6    Recent Results (from the past 240 hour(s))  MRSA PCR Screening     Status: None   Collection Time: 09/16/17  3:39 PM  Result Value Ref Range Status   MRSA by PCR NEGATIVE NEGATIVE Final    Comment:        The GeneXpert MRSA Assay (FDA approved for NASAL specimens only), is one component of a comprehensive MRSA colonization surveillance program. It is not intended to diagnose MRSA infection nor to guide or monitor treatment for MRSA infections. Performed at [facility], [place] [address] [place], [place] [address]          Radiology Studies: Dg Chest 2 View  Result Date: 09/16/2017 CLINICAL DATA:  Cough and fever EXAM: CHEST - 2 VIEW COMPARISON:  December 07, 2015 FINDINGS: There is no edema or consolidation. The heart size and pulmonary vascularity are normal. No adenopathy. No evident bone lesions. IMPRESSION: No edema or consolidation. Electronically Signed   By:  Lowella Grip III M.D.   On: 09/16/2017 10:57        Scheduled Meds: . doxycycline  100 mg Oral Q12H   Continuous Infusions: . sodium chloride 75 mL/hr at 09/17/17 0840  . phenylephrine (NEO-SYNEPHRINE) Adult infusion 20 mcg/min (09/17/17 0958)  . piperacillin-tazobactam (ZOSYN)  IV Stopped (09/17/17 1034)  . potassium chloride    . vancomycin       LOS: 1 day        Aline August, MD [facility] Pager [phone]  If 7PM-7AM, please contact night-coverage [email or web address] Password [email or web address] 09/17/2017, 10:35 AM

## 2017-09-18 DIAGNOSIS — R1011 Right upper quadrant pain: Secondary | ICD-10-CM

## 2017-09-18 LAB — COMPREHENSIVE METABOLIC PANEL
ALBUMIN: 2.4 g/dL — AB (ref 3.5–5.0)
ALK PHOS: 69 U/L (ref 38–126)
ALT: 51 U/L (ref 14–54)
ANION GAP: 5 (ref 5–15)
AST: 77 U/L — ABNORMAL HIGH (ref 15–41)
BILIRUBIN TOTAL: 0.5 mg/dL (ref 0.3–1.2)
BUN: 12 mg/dL (ref 6–20)
CALCIUM: 8 mg/dL — AB (ref 8.9–10.3)
CO2: 19 mmol/L — ABNORMAL LOW (ref 22–32)
Chloride: 114 mmol/L — ABNORMAL HIGH (ref 101–111)
Creatinine, Ser: 0.83 mg/dL (ref 0.44–1.00)
GFR calc Af Amer: >60 mL/min (ref >60)
GLUCOSE: 110 mg/dL — AB (ref 65–99)
Potassium: 3.5 mmol/L (ref 3.5–5.1)
Sodium: 138 mmol/L (ref 135–145)
TOTAL PROTEIN: 4.9 g/dL — AB (ref 6.5–8.1)

## 2017-09-18 LAB — CBC WITH DIFFERENTIAL/PLATELET
Basophils Absolute: 0.1 10*3/uL (ref 0.0–0.1)
Basophils Relative: 3 %
Eosinophils Absolute: 0 10*3/uL (ref 0.0–0.7)
Eosinophils Relative: 0 %
HEMATOCRIT: 27.6 % — AB (ref 36.0–46.0)
HEMOGLOBIN: 9.3 g/dL — AB (ref 12.0–15.0)
LYMPHS ABS: 2.7 10*3/uL (ref 0.7–4.0)
LYMPHS PCT: 52 %
MCH: 30.7 pg (ref 26.0–34.0)
MCHC: 33.7 g/dL (ref 30.0–36.0)
MCV: 91.1 fL (ref 78.0–100.0)
Monocytes Absolute: 0.5 10*3/uL (ref 0.1–1.0)
Monocytes Relative: 10 %
NEUTROS ABS: 1.9 10*3/uL (ref 1.7–7.7)
NEUTROS PCT: 35 %
Platelets: 106 10*3/uL — ABNORMAL LOW (ref 150–400)
RBC: 3.03 MIL/uL — ABNORMAL LOW (ref 3.87–5.11)
RDW: 14.6 % (ref 11.5–15.5)
WBC: 5.2 10*3/uL (ref 4.0–10.5)

## 2017-09-18 LAB — ROCKY MTN SPOTTED FVR ABS PNL(IGG+IGM)
RMSF IGG: POSITIVE — AB
RMSF IgM: 0.41 index (ref 0.00–0.89)

## 2017-09-18 LAB — B. BURGDORFI ANTIBODIES: B burgdorferi Ab IgG+IgM: <0.91 {ISR} (ref 0.00–0.90)

## 2017-09-18 LAB — HIV ANTIBODY (ROUTINE TESTING W REFLEX): HIV SCREEN 4TH GENERATION: NONREACTIVE

## 2017-09-18 LAB — MAGNESIUM: Magnesium: 1.7 mg/dL (ref 1.7–2.4)

## 2017-09-18 LAB — RMSF, IGG, IFA: RMSF, IGG, IFA: <1:64 {titer}

## 2017-09-18 MED ORDER — ZONISAMIDE 100 MG PO CAPS
100.0000 mg | ORAL_CAPSULE | Freq: Every morning | ORAL | Status: DC
  Administered 2017-09-19: 100 mg via ORAL
  Filled 2017-09-18: qty 1

## 2017-09-18 MED ORDER — LACTATED RINGERS IV SOLN
INTRAVENOUS | Status: DC
  Administered 2017-09-18 – 2017-09-19 (×2): via INTRAVENOUS

## 2017-09-18 MED ORDER — ZONISAMIDE 25 MG PO CAPS
150.0000 mg | ORAL_CAPSULE | Freq: Every day | ORAL | Status: DC
Start: 2017-09-18 — End: 2017-09-19
  Administered 2017-09-18: 150 mg via ORAL
  Filled 2017-09-18 (×2): qty 2

## 2017-09-18 MED ORDER — VITAMINS A & D EX OINT
TOPICAL_OINTMENT | CUTANEOUS | Status: AC
  Administered 2017-09-18: 21:00:00
  Filled 2017-09-18: qty 5

## 2017-09-18 NOTE — Progress Notes (Signed)
Name: Katelyn Mcclain MRN: [id number] DOB: 06-26-1950    ADMISSION DATE:  09/16/2017 CONSULTATION DATE:  09/18/2017  REFERRING MD :  Tawanna Solo, [facility]  CHIEF COMPLAINT:  hypotension    HISTORY OF PRESENT ILLNESS: 67 year old woman with a history of bacterial overgrowth syndrome presented to [facility] emergency room with fever for 7 days and hypotension.  She was started on empiric Levaquin by her PCP. She has a history of bacterial overgrowth syndrome and follows with gastroenterology at [facility] I have reviewed notes in care everywhere including recent MRI that showed small hiatal hernia but no evidence of distended bowel.  She has recently been treated with Bactrim for this.  She also has a history of tick bites.  Blood pressure was in the 80s on arrival to [facility] emergency room with lactate of 2.1, she was given 3 L of IV fluids and transferred to [facility] long  ICU.  Blood pressure remained in the low 90s to 80s, repeat lactate was 2.7 hence we are consulted  Labs did not show any leukocytosis, creatinine was 1.3, chest x-ray did not show any infiltrates UA was negative for nitrite and leukoesterase but microscopy showed many bacteria with WBC casts She has received broad-spectrum antibiotics  SUBJECTIVE:  Feels better  Still weak   VITAL SIGNS: Temp:  [98.4 F (36.9 C)-100.3 F (37.9 C)] 98.6 F (37 C) (06/11 0800) Pulse Rate:  [61-82] 71 (06/11 0800) Resp:  [14-27] 20 (06/11 0800) BP: (92-105)/(46-61) 95/50 (06/11 0800) SpO2:  [93 %-99 %] 99 % (06/11 0800)  PHYSICAL EXAMINATION: General: awake, alert, no focal def.  HENT:  NCAT. No JVD. MMM Pulm: clear no accessory use  Card: RRR no MRG Abd: soft, not tender  Ext/ms: warm, dry, no edema brisk CR Neuro: intact   Recent Labs  Lab 09/16/17 0954 09/17/17 0331 09/18/17 0316  NA 129* 136 138  K 3.5 3.2* 3.5  CL 99* 113* 114*  CO2 21* 18* 19*  BUN 25* 14 12  CREATININE 1.34* 0.89 0.83  GLUCOSE 140* 106*  110*   Recent Labs  Lab 09/16/17 0954 09/17/17 0331 09/18/17 0316  HGB 11.9* 9.8* 9.3*  HCT 33.8* 28.0* 27.6*  WBC 6.3 5.4 5.2  PLT 72* 75* 106*   Dg Chest 2 View  Result Date: 09/16/2017 CLINICAL DATA:  Cough and fever EXAM: CHEST - 2 VIEW COMPARISON:  December 07, 2015 FINDINGS: There is no edema or consolidation. The heart size and pulmonary vascularity are normal. No adenopathy. No evident bone lesions. IMPRESSION: No edema or consolidation. Electronically Signed   By: Lowella Grip III M.D.   On: 09/16/2017 10:57   US Abdomen Limited Ruq  Result Date: 09/17/2017 CLINICAL DATA:  Elevated LFT.  Abdominal pain EXAM: ULTRASOUND ABDOMEN LIMITED RIGHT UPPER QUADRANT COMPARISON:  MRI liver 12/13/2016.  Ultrasound abdomen 11/09/2016 FINDINGS: Gallbladder: Gallbladder distended. Negative for gallstones. Gallbladder wall mildly thickened 4.2 mm. Negative sonographic Murphy sign. Small amount of pericholecystic fluid. Previously noted small lesion in the fundus of the gallbladder on MRI not visualized by ultrasound. Common bile duct: Diameter: 2.6 mm Liver: No focal lesion identified. Within normal limits in parenchymal echogenicity. Portal vein is patent on color Doppler imaging with normal direction of blood flow towards the liver. IMPRESSION: Distended gallbladder with gallbladder wall thickening and mild pericholecystic fluid. Negative for gallstones or gallbladder pain. Question cholecystitis symptoms. Electronically Signed   By: Franchot Gallo M.D.   On: 09/17/2017 19:02    ASSESSMENT /  PLAN: Resolved issues: AKI  Active issues Sepsis syndrome.  - source remains unclear but best guess is GI & bacterial translocation. Mild distended GB w/ out gallstones or pain. ? Acalculous cholecystitis?. Either way things seem to be improving.   Plan Day # 3 doxy, vanc and zosyn; will dc vanc Dc neo. Accept SBP > 85 Cont doxy for possible tick bite as we await serologies Mobilize and check  orthostatics.  IM has contacted general surg.   Mild hyperchloremia w/ NAG metabolic acidosis.  -likely a result of bicarb loss from diarrhea AND NaCl replacement Plan Change IVF to LR   Anemia w/ out evidence of bleeding  Plan Trend CBC Transfuse for Hgb < 7  Thrombocytopenia -likely d/t sepsis-->improving  Plan Trend    We will look in again -->if stays off pressors we will sign off.   Erick Colace ACNP-BC [place] Pager # [phone] OR # [phone] if no answer

## 2017-09-18 NOTE — Consult Note (Addendum)
[facility] Surgery Consult Note  Katelyn Mcclain 1950-12-19  [id number].    Requesting MD: Starla Link, MD Chief Complaint/Reason for Consult: RUQ pain, fever  HPI:  67 y/o F with a PMH celiac disease, bacterial overgrowth syndrome, and GERD s/p Nissen 2000 who presents with 7 days of fever, refractory to PO abx prescribed by her PCP. She was found to be febrile and hypotensive with an elevated lactate and was admitted to the ICU. Her AST was minimally elevated at 52. General surgery has been asked to see the patient to r.o cholecystitis as a source of her sepsis. Today the patient endorses a 1-2 year history of intermittent, aching, RUQ pain. Pain is not associated with meals. It occasionally radiates to between her shoulder blades. She denies nausea, vomiting, or diarrhea. She reports having a gallbladder workup by Dr. Collene Mares that was negative. She denies tobacco, alcohol, or illicit drug use. States that morphine makes her migraine headaches worse.   Her workup this admission included a RUQ  U/S that was negative for cholelithiasis but showed questionable wall thickening 46m. Negative murphy's.  ROS: Review of Systems  Constitutional: Positive for fever and malaise/fatigue. Negative for chills.  Gastrointestinal: Positive for abdominal pain, constipation and heartburn. Negative for nausea and vomiting.  All other systems reviewed and are negative.   Family History  Problem Relation Age of Onset  . Stroke Father   . Dementia Father   . Atrial fibrillation Father   . Cancer Father   . Hypertension Mother   . Scoliosis Mother   . Heart disease Sister        Congenital heart disease  . Migraines Neg Hx     Past Medical History:  Diagnosis Date  . Abscessed tooth   . Allergic rhinitis   . Asthma   . Asthma    inhaler prn  . Celiac disease   . Depression with anxiety   . Dysmenorrhea   . Gastroparesis   . GERD (gastroesophageal reflux disease)   . Hx of low back pain   .  IBS (irritable bowel syndrome)   . Migraine   . Migraine without aura, with intractable migraine, so stated, without mention of status migrainosus 05/20/2013  . Osteopenia     Past Surgical History:  Procedure Laterality Date  . ABDOMINAL SURGERY    . BREAST BIOPSY Right    x2  . BREAST CYST ASPIRATION  1999  . COLONOSCOPY    . FOOT SURGERY Left    Bunionectomy  . MASS EXCISION  08/08/2011   Procedure: MINOR EXCISION OF MASS;  Surgeon: RCammie Sickle, MD;  Location: [facility]  Service: Orthopedics;  Laterality: Left;  left ring excision cyst proximal phalangeal joint  . NISSEN FUNDOPLICATION    . ROTATOR CUFF REPAIR Right 05/2015  . TONSILLECTOMY      Social History:  reports that she has quit smoking. She has never used smokeless tobacco. She reports that she drinks alcohol. She reports that she does not use drugs.  Allergies:  Allergies  Allergen Reactions  . Adhesive [Tape] Other (See Comments)    Tear's skin off   . Morphine And Related Other (See Comments)    Sensitive to narcotics, worsening migraines.  . Clonazepam Other (See Comments)    Caused anxiety  . Lactose Intolerance (Gi) Diarrhea and Nausea And Vomiting  . Linaclotide Other (See Comments)    Caused spastic bladder (reaction to Linzess)  . Other  Pt does not use artificial sweeteners     Facility-Administered Medications Prior to Admission  Medication Dose Route Frequency Provider Last Rate Last Dose  . albuterol (PROVENTIL) (2.5 MG/3ML) 0.083% nebulizer solution 2.5 mg  2.5 mg Nebulization Once Dunn, Ryan M, PA-C      . ipratropium (ATROVENT) nebulizer solution 0.5 mg  0.5 mg Nebulization Once Dunn, Ryan M, PA-C       Medications Prior to Admission  Medication Sig Dispense Refill  . acetaminophen (TYLENOL) 500 MG tablet Take 500 mg by mouth every 6 (six) hours as needed (pain).    Marland Kitchen albuterol (PROVENTIL HFA;VENTOLIN HFA) 108 (90 BASE) MCG/ACT inhaler Inhale 1 puff into the lungs  every 6 (six) hours as needed for wheezing or shortness of breath (seasonal allergies).     . Calcium-Magnesium-Vitamin D (CALCIUM 1200+D3 PO) Take 1,200 mg by mouth daily.    . cycloSPORINE (RESTASIS) 0.05 % ophthalmic emulsion Place 1 drop into both eyes at bedtime.     . dicyclomine (BENTYL) 20 MG tablet Take 20 mg by mouth 2 (two) times daily as needed for spasms (stomach pain).   6  . docusate sodium (COLACE) 100 MG capsule Take 100 mg by mouth 2 (two) times daily.    . Estrogens, Conjugated (PREMARIN VA) Place 1 application vaginally once a week.     . Eszopiclone (ESZOPICLONE) 3 MG TABS Take 3 mg by mouth at bedtime as needed (sleep). Take immediately before bedtime     . Garlic (GARLIQUE PO) Take 1 capsule by mouth daily.     Marland Kitchen levofloxacin (LEVAQUIN) 500 MG tablet Take 500 mg by mouth daily.     . Miconazole Nitrate (MONISTAT 7 VA) Place 1 application vaginally at bedtime as needed (yeast infection).    . montelukast (SINGULAIR) 10 MG tablet Take 10 mg by mouth at bedtime.     Marland Kitchen omeprazole (PRILOSEC) 40 MG capsule Take 40 mg by mouth daily.    . ondansetron (ZOFRAN) 4 MG tablet TAKE 1 TABLET BY MOUTH EVERY 6 HOURS AS NEEDED FOR NAUSEA - IF PHERERGAN NOT WORKING  2  . OVER THE COUNTER MEDICATION Take 500 mg by mouth daily. Vitamin B5    . OVER THE COUNTER MEDICATION Take 500 mg by mouth daily. Berberine    . Probiotic Product (PROBIOTIC DAILY PO) Take 1 tablet by mouth 2 (two) times daily.     . promethazine (PHENERGAN) 25 MG tablet Take 1 tablet (25 mg total) by mouth every 6 (six) hours as needed for nausea or vomiting. 10 tablet 0  . pyridoxine (B-6) 200 MG tablet Take 200 mg by mouth daily.    . rizatriptan (MAXALT-MLT) 10 MG disintegrating tablet Take 1 tablet PO at the onset of migraine and May repeat in 2 hours if needed. Not to exceed 2 tabs/24 hours. 9 tablet 6  . Sennosides 25 MG TABS Take 50 mg by mouth daily as needed.    . sulfamethoxazole-trimethoprim (BACTRIM,SEPTRA) 400-80  MG tablet Take 1 tablet by mouth 2 (two) times daily.  3  . vortioxetine HBr (TRINTELLIX) 20 MG TABS Take 20 mg by mouth at bedtime.    Marland Kitchen zonisamide (ZONEGRAN) 100 MG capsule Take 1 capsule (100 mg total) by mouth 2 (two) times daily. 180 capsule 3  . zonisamide (ZONEGRAN) 25 MG capsule TAKE 2 CAPSULE EVERY EVENING WITH 100 MG CAPSULE TO MAKE 150 MG NIGHTLY DOSE. 180 capsule 3    Blood pressure (!) 88/46, pulse 80, temperature 98.6 F (  37 C), temperature source Oral, resp. rate 15, height 5' 10.5" (1.791 m), weight 66.4 kg (146 lb 6.2 oz), SpO2 96 %. Physical Exam: Physical Exam  Constitutional: She is oriented to person, place, and time. She appears well-developed and well-nourished. No distress.  HENT:  Head: Normocephalic and atraumatic.  Right Ear: External ear normal.  Left Ear: External ear normal.  Nose: Nose normal.  Eyes: Right eye exhibits no discharge. Left eye exhibits no discharge. No scleral icterus.  Neck: Normal range of motion. No JVD present. No tracheal deviation present.  Cardiovascular: Normal rate, regular rhythm and normal heart sounds. Exam reveals no friction rub.  No murmur heard. Pulmonary/Chest: Effort normal and breath sounds normal. No stridor. No respiratory distress. She has no wheezes. She has no rales.  Abdominal: Soft. Bowel sounds are normal. She exhibits no distension and no mass. There is no tenderness. There is no rebound and no guarding.  Musculoskeletal: Normal range of motion. She exhibits no edema, tenderness or deformity.  Neurological: She is alert and oriented to person, place, and time. No sensory deficit.  Skin: Skin is warm and dry. Capillary refill takes less than 2 seconds. No rash noted. She is not diaphoretic.  Psychiatric: She has a normal mood and affect. Her behavior is normal.    Results for orders placed or performed during the hospital encounter of 09/16/17 (from the past 48 hour(s))  Comprehensive metabolic panel     Status:  Abnormal   Collection Time: 09/16/17  9:54 AM  Result Value Ref Range   Sodium 129 (L) 135 - 145 mmol/L   Potassium 3.5 3.5 - 5.1 mmol/L   Chloride 99 (L) 101 - 111 mmol/L   CO2 21 (L) 22 - 32 mmol/L   Glucose, Bld 140 (H) 65 - 99 mg/dL   BUN 25 (H) 6 - 20 mg/dL   Creatinine, Ser 1.34 (H) 0.44 - 1.00 mg/dL   Calcium 8.2 (L) 8.9 - 10.3 mg/dL   Total Protein 6.8 6.5 - 8.1 g/dL   Albumin 3.5 3.5 - 5.0 g/dL   AST 52 (H) 15 - 41 U/L   ALT 33 14 - 54 U/L   Alkaline Phosphatase 63 38 - 126 U/L   Total Bilirubin 0.4 0.3 - 1.2 mg/dL   GFR calc non Af Amer 41 (L) >60 mL/min   GFR calc Af Amer 47 (L) >60 mL/min    Comment: (NOTE) The eGFR has been calculated using the CKD EPI equation. This calculation has not been validated in all clinical situations. eGFR's persistently <60 mL/min signify possible Chronic Kidney Disease.    Anion gap 9 5 - 15    Comment: Performed at [facility], [address] [place], [place] [address]  CBC with Differential     Status: Abnormal   Collection Time: 09/16/17  9:54 AM  Result Value Ref Range   WBC 6.3 4.0 - 10.5 K/uL   RBC 3.75 (L) 3.87 - 5.11 MIL/uL   Hemoglobin 11.9 (L) 12.0 - 15.0 g/dL   HCT 33.8 (L) 36.0 - 46.0 %   MCV 90.1 78.0 - 100.0 fL   MCH 31.7 26.0 - 34.0 pg   MCHC 35.2 30.0 - 36.0 g/dL   RDW 13.1 11.5 - 15.5 %   Platelets 72 (L) 150 - 400 K/uL    Comment: REPEATED TO VERIFY PLATELET COUNT CONFIRMED BY SMEAR GIANT PLATELETS SEEN    Neutrophils Relative % 87 %   Neutro Abs 5.4 1.7 -  7.7 K/uL   Lymphocytes Relative 8 %   Lymphs Abs 0.5 0.7 - 4.0 K/uL   Monocytes Relative 5 %   Monocytes Absolute 0.3 0.1 - 1.0 K/uL   Eosinophils Relative 0 %   Eosinophils Absolute 0.0 0.0 - 0.7 K/uL   Basophils Relative 0 %   Basophils Absolute 0.0 0.0 - 0.1 K/uL   WBC Morphology ATYPICAL LYMPHOCYTES     Comment: MODERATE LEFT SHIFT (>5% METAS AND MYELOS,OCC PRO NOTED) Performed at [facility], [address] [place], [place] [address]   Blood Culture (routine x 2)     Status: None (Preliminary result)   Collection Time: 09/16/17  9:54 AM  Result Value Ref Range   Specimen Description      BLOOD LEFT ARM Performed at [facility], [address] [place], [place] [address]    Special Requests      BOTTLES DRAWN AEROBIC AND ANAEROBIC Blood Culture adequate volume Performed at [facility], [address] [place], [place] [address]    Culture      NO GROWTH < 24 HOURS Performed at [facility], [place] [address], [place], [place] [address]    Report Status PENDING   Lipase, blood     Status: None   Collection Time: 09/16/17  9:54 AM  Result Value Ref Range   Lipase 26 11 - 51 U/L    Comment: Performed at [facility], [address] [place], [place] [address]  Troponin I     Status: None   Collection Time: 09/16/17  9:54 AM  Result Value Ref Range   Troponin I <0.03 <0.03 ng/mL    Comment: Performed at [facility], [address] [place], [place] [address]  Pathologist smear review     Status: None   Collection Time: 09/16/17  9:54 AM  Result Value Ref Range   Path Review Left shift.     Comment: Reactive appearing lymphocytes. Reviewed by Lennox Solders Lyndon Code, M.D. [phone] Performed at [facility], [place] [address] [place], [place] [address]   I-Stat CG4 Lactic Acid, ED     Status: Abnormal   Collection Time: 09/16/17  9:57 AM  Result Value Ref Range   Lactic Acid, Venous 2.14 (HH) 0.5 - 1.9 mmol/L   Comment NOTIFIED PHYSICIAN   Blood Culture (routine x 2)     Status: None (Preliminary result)   Collection Time: 09/16/17 10:44 AM  Result Value Ref Range   Specimen Description      BLOOD RIGHT ARM Performed at [facility], [address] [place], [place] [address]    Special Requests      BOTTLES DRAWN AEROBIC AND ANAEROBIC Blood Culture adequate volume Performed at [facility], [address] [place], [place] [address]    Culture      NO GROWTH < 24 HOURS Performed at [facility], [place] [address] [place], [place] [address]    Report Status PENDING   Urinalysis, Routine w reflex microscopic     Status: Abnormal   Collection Time: 09/16/17 11:27 AM  Result Value Ref Range   Color, Urine YELLOW YELLOW   APPearance CLEAR CLEAR   Specific Gravity, Urine 1.020 1.005 - 1.030   pH 6.0 5.0 - 8.0   Glucose, UA NEGATIVE NEGATIVE mg/dL   Hgb urine dipstick NEGATIVE NEGATIVE  Bilirubin Urine NEGATIVE NEGATIVE   Ketones, ur NEGATIVE NEGATIVE mg/dL   Protein, ur 30 (A) NEGATIVE mg/dL   Nitrite NEGATIVE NEGATIVE   Leukocytes, UA NEGATIVE NEGATIVE    Comment: Performed at [facility], [address] [place], [place] [address]  Urine culture     Status: None   Collection Time: 09/16/17 11:27 AM  Result Value Ref Range   Specimen Description      URINE, RANDOM Performed at [facility], [address] [place], [place] [address]    Special Requests      NONE Performed at [facility], [address] [place], [place] [address]    Culture      NO GROWTH Performed at [facility], [place] [address], [place], [place] [address]    Report Status 09/17/2017 FINAL   Urinalysis, Microscopic (reflex)     Status: Abnormal   Collection Time: 09/16/17 11:27 AM  Result Value Ref Range   RBC / HPF NONE SEEN 0 - 5 RBC/hpf   WBC, UA 0-5 0 - 5 WBC/hpf   Bacteria, UA MANY (A) NONE SEEN   Squamous Epithelial / LPF 0-5 0 - 5   Hyaline Casts, UA PRESENT    Granular Casts, UA PRESENT    WBC Casts, UA PRESENT     Comment: Performed at [facility], [address] [place], [place] [address]  I-Stat CG4 Lactic Acid, ED     Status: Abnormal   Collection Time: 09/16/17 11:58 AM  Result Value Ref Range   Lactic Acid, Venous 2.67 (HH) 0.5 - 1.9 mmol/L   Comment NOTIFIED PHYSICIAN   MRSA PCR Screening     Status: None   Collection Time: 09/16/17  3:39  PM  Result Value Ref Range   MRSA by PCR NEGATIVE NEGATIVE    Comment:        The GeneXpert MRSA Assay (FDA approved for NASAL specimens only), is one component of a comprehensive MRSA colonization surveillance program. It is not intended to diagnose MRSA infection nor to guide or monitor treatment for MRSA infections. Performed at [facility], [place] [address], [place], [place] [address]   Procalcitonin - Baseline     Status: None   Collection Time: 09/16/17  4:20 PM  Result Value Ref Range   Procalcitonin 0.98 ng/mL    Comment:        Interpretation: PCT > 0.5 ng/mL and <= 2 ng/mL: Systemic infection (sepsis) is possible, but other conditions are known to elevate PCT as well. (NOTE)       Sepsis PCT Algorithm           Lower Respiratory Tract                                      Infection PCT Algorithm    ----------------------------     ----------------------------         PCT < 0.25 ng/mL                PCT < 0.10 ng/mL         Strongly encourage             Strongly discourage   discontinuation of antibiotics    initiation of antibiotics    ----------------------------     -----------------------------  PCT 0.25 - 0.50 ng/mL            PCT 0.10 - 0.25 ng/mL               OR       >80% decrease in PCT            Discourage initiation of                                            antibiotics      Encourage discontinuation           of antibiotics    ----------------------------     -----------------------------         PCT >= 0.50 ng/mL              PCT 0.26 - 0.50 ng/mL                AND       <80% decrease in PCT             Encourage initiation of                                             antibiotics       Encourage continuation           of antibiotics    ----------------------------     -----------------------------        PCT >= 0.50 ng/mL                  PCT > 0.50 ng/mL               AND         increase in PCT                   Strongly encourage                                      initiation of antibiotics    Strongly encourage escalation           of antibiotics                                     -----------------------------                                           PCT <= 0.25 ng/mL                                                 OR                                        > 80% decrease in PCT  Discontinue / Do not initiate                                             antibiotics Performed at [facility] [address] [place], [place] [address]   Lactic acid, plasma     Status: Abnormal   Collection Time: 09/16/17  4:20 PM  Result Value Ref Range   Lactic Acid, Venous 2.3 (HH) 0.5 - 1.9 mmol/L    Comment: CRITICAL RESULT CALLED TO, READ BACK BY AND VERIFIED WITH: GROGAN,B AT 1710 ON [id number] BY HOOKER,B Performed at [facility], [place] [address], [place], [place] [address]   HIV antibody (Routine Testing)     Status: None   Collection Time: 09/16/17  4:20 PM  Result Value Ref Range   HIV Screen 4th Generation wRfx Non Reactive Non Reactive    Comment: (NOTE) Performed At: [facility] [place], [place] [id number] Rush Farmer MD [id number] Performed at [facility], [place] [address], [place], [place] [address]   Lactic acid, plasma     Status: None   Collection Time: 09/17/17  3:31 AM  Result Value Ref Range   Lactic Acid, Venous 1.6 0.5 - 1.9 mmol/L    Comment: Performed at [facility], [place] [address], [place], [place] [address]  Comprehensive metabolic panel     Status: Abnormal   Collection Time: 09/17/17  3:31 AM  Result Value Ref Range   Sodium 136 135 - 145 mmol/L   Potassium 3.2 (L) 3.5 - 5.1 mmol/L   Chloride 113 (H) 101 - 111 mmol/L   CO2 18 (L) 22 - 32 mmol/L   Glucose, Bld 106 (H) 65 - 99 mg/dL   BUN 14 6 - 20 mg/dL   Creatinine, Ser 0.89 0.44  - 1.00 mg/dL   Calcium 7.3 (L) 8.9 - 10.3 mg/dL   Total Protein 5.1 (L) 6.5 - 8.1 g/dL   Albumin 2.6 (L) 3.5 - 5.0 g/dL   AST 101 (H) 15 - 41 U/L   ALT 62 (H) 14 - 54 U/L   Alkaline Phosphatase 70 38 - 126 U/L   Total Bilirubin 0.2 (L) 0.3 - 1.2 mg/dL   GFR calc non Af Amer >60 >60 mL/min   GFR calc Af Amer >60 >60 mL/min    Comment: (NOTE) The eGFR has been calculated using the CKD EPI equation. This calculation has not been validated in all clinical situations. eGFR's persistently <60 mL/min signify possible Chronic Kidney Disease.    Anion gap 5 5 - 15    Comment: Performed at [facility], [place] [address], [place], [place] [address]  CBC     Status: Abnormal   Collection Time: 09/17/17  3:31 AM  Result Value Ref Range   WBC 5.4 4.0 - 10.5 K/uL   RBC 3.06 (L) 3.87 - 5.11 MIL/uL   Hemoglobin 9.8 (L) 12.0 - 15.0 g/dL   HCT 28.0 (L) 36.0 - 46.0 %   MCV 91.5 78.0 - 100.0 fL   MCH 32.0 26.0 - 34.0 pg   MCHC 35.0 30.0 - 36.0 g/dL   RDW 14.1 11.5 - 15.5 %   Platelets 75 (L) 150 - 400 K/uL    Comment: REPEATED TO VERIFY SPECIMEN CHECKED FOR CLOTS PLATELET COUNT CONFIRMED BY SMEAR Performed at [facility], [place] [address], [place], [place] [address]   Gastrointestinal Panel by PCR , Stool     Status: None   Collection Time: 09/17/17  9:20 AM  Result Value Ref Range   Campylobacter species NOT DETECTED NOT DETECTED   Plesimonas shigelloides NOT DETECTED NOT DETECTED   Salmonella species NOT DETECTED NOT DETECTED   Yersinia enterocolitica NOT DETECTED NOT DETECTED   Vibrio species NOT DETECTED NOT DETECTED   Vibrio cholerae NOT DETECTED NOT DETECTED   Enteroaggregative E coli (EAEC) NOT DETECTED NOT DETECTED   Enteropathogenic E coli (EPEC) NOT DETECTED NOT DETECTED   Enterotoxigenic E coli (ETEC) NOT DETECTED NOT DETECTED   Shiga like toxin producing E coli (STEC) NOT DETECTED NOT DETECTED   Shigella/Enteroinvasive E coli (EIEC) NOT  DETECTED NOT DETECTED   Cryptosporidium NOT DETECTED NOT DETECTED   Cyclospora cayetanensis NOT DETECTED NOT DETECTED   Entamoeba histolytica NOT DETECTED NOT DETECTED   Giardia lamblia NOT DETECTED NOT DETECTED   Adenovirus F40/41 NOT DETECTED NOT DETECTED   Astrovirus NOT DETECTED NOT DETECTED   Norovirus GI/GII NOT DETECTED NOT DETECTED   Rotavirus A NOT DETECTED NOT DETECTED   Sapovirus (I, II, IV, and V) NOT DETECTED NOT DETECTED    Comment: Performed at [facility], [address] [place], [place] [address]  C difficile quick scan w PCR reflex     Status: None   Collection Time: 09/17/17  9:20 AM  Result Value Ref Range   C Diff antigen NEGATIVE NEGATIVE   C Diff toxin NEGATIVE NEGATIVE   C Diff interpretation No C. difficile detected.     Comment: Performed at [facility], [place] [address], [place], [place] [address]  CBC with Differential/Platelet     Status: Abnormal   Collection Time: 09/18/17  3:16 AM  Result Value Ref Range   WBC 5.2 4.0 - 10.5 K/uL   RBC 3.03 (L) 3.87 - 5.11 MIL/uL   Hemoglobin 9.3 (L) 12.0 - 15.0 g/dL   HCT 27.6 (L) 36.0 - 46.0 %   MCV 91.1 78.0 - 100.0 fL   MCH 30.7 26.0 - 34.0 pg   MCHC 33.7 30.0 - 36.0 g/dL   RDW 14.6 11.5 - 15.5 %   Platelets 106 (L) 150 - 400 K/uL    Comment: REPEATED TO VERIFY SPECIMEN CHECKED FOR CLOTS PLATELET COUNT CONFIRMED BY SMEAR    Neutrophils Relative % 35 %   Neutro Abs 1.9 1.7 - 7.7 K/uL   Lymphocytes Relative 52 %   Lymphs Abs 2.7 0.7 - 4.0 K/uL   Monocytes Relative 10 %   Monocytes Absolute 0.5 0.1 - 1.0 K/uL   Eosinophils Relative 0 %   Eosinophils Absolute 0.0 0.0 - 0.7 K/uL   Basophils Relative 3 %   Basophils Absolute 0.1 0.0 - 0.1 K/uL    Comment: Performed at [facility], [place] [address], [place], [place] [address]  Comprehensive metabolic panel     Status: Abnormal   Collection Time: 09/18/17  3:16 AM  Result Value Ref Range   Sodium 138 135 - 145  mmol/L   Potassium 3.5 3.5 - 5.1 mmol/L   Chloride 114 (H) 101 - 111 mmol/L   CO2 19 (L) 22 - 32 mmol/L   Glucose, Bld 110 (H) 65 - 99 mg/dL   BUN 12 6 - 20 mg/dL   Creatinine, Ser 0.83 0.44 - 1.00 mg/dL   Calcium 8.0 (L) 8.9 - 10.3 mg/dL  Total Protein 4.9 (L) 6.5 - 8.1 g/dL   Albumin 2.4 (L) 3.5 - 5.0 g/dL   AST 77 (H) 15 - 41 U/L   ALT 51 14 - 54 U/L   Alkaline Phosphatase 69 38 - 126 U/L   Total Bilirubin 0.5 0.3 - 1.2 mg/dL   GFR calc non Af Amer >60 >60 mL/min   GFR calc Af Amer >60 >60 mL/min    Comment: (NOTE) The eGFR has been calculated using the CKD EPI equation. This calculation has not been validated in all clinical situations. eGFR's persistently <60 mL/min signify possible Chronic Kidney Disease.    Anion gap 5 5 - 15    Comment: Performed at [facility], [place] [address] [place], [place] [address]  Magnesium     Status: None   Collection Time: 09/18/17  3:16 AM  Result Value Ref Range   Magnesium 1.7 1.7 - 2.4 mg/dL    Comment: Performed at [facility], [place] [address] [place], [place] [address]   Dg Chest 2 View  Result Date: 09/16/2017 CLINICAL DATA:  Cough and fever EXAM: CHEST - 2 VIEW COMPARISON:  December 07, 2015 FINDINGS: There is no edema or consolidation. The heart size and pulmonary vascularity are normal. No adenopathy. No evident bone lesions. IMPRESSION: No edema or consolidation. Electronically Signed   By: Lowella Grip III M.D.   On: 09/16/2017 10:57   US Abdomen Limited Ruq  Result Date: 09/17/2017 CLINICAL DATA:  Elevated LFT.  Abdominal pain EXAM: ULTRASOUND ABDOMEN LIMITED RIGHT UPPER QUADRANT COMPARISON:  MRI liver 12/13/2016.  Ultrasound abdomen 11/09/2016 FINDINGS: Gallbladder: Gallbladder distended. Negative for gallstones. Gallbladder wall mildly thickened 4.2 mm. Negative sonographic Murphy sign. Small amount of pericholecystic fluid. Previously noted small lesion in the fundus of the gallbladder  on MRI not visualized by ultrasound. Common bile duct: Diameter: 2.6 mm Liver: No focal lesion identified. Within normal limits in parenchymal echogenicity. Portal vein is patent on color Doppler imaging with normal direction of blood flow towards the liver. IMPRESSION: Distended gallbladder with gallbladder wall thickening and mild pericholecystic fluid. Negative for gallstones or gallbladder pain. Question cholecystitis symptoms. Electronically Signed   By: Franchot Gallo M.D.   On: 09/17/2017 19:02   Assessment/Plan Sepsis unknown etiology Celiac disease GERD Bacterial overgrowth syndrome  Chronic RUQ pain  Elevated LFTs  - history not consistent with acalculous cholecystitis or cholestasis, non-tender on exam  - RUQ U/S with questionable GB wall thickening and no other signs of cholecystitis. No leukocytosis. - will follow LFT's, re-check in AM - no acute surgical needs, patient improving since her admission. Recommend outpatient follow up with a general surgeon to discuss the role of elective cholecystectomy, though it may not relieve her chronic RUQ pain.   Jill Alexanders, [facility] 09/18/2017, 9:42 AM Pager: [phone] Consults: [phone] Mon-Fri 7:00 am-4:30 pm Sat-Sun 7:00 am-11:30 am

## 2017-09-18 NOTE — Progress Notes (Signed)
Patient ID: Katelyn Mcclain, female   DOB: 04-20-1950, 67 y.o.   MRN: [id number]  PROGRESS NOTE    Katelyn Mcclain  XLK:[id number] DOB: 1951-03-25 DOA: 09/16/2017 PCP: Jani Gravel, MD   Brief Narrative:  67 year old female with history of celiac disease, bacterial overgrowth syndrome, GERD, migraine headaches who was recently treated with Bactrim for bacterial overgrowth syndrome by her gastroenterologist at [facility] and also subsequently recently treated by her PCP with Levaquin for fever presented on 09/16/2017 with fever for last 1 week.  Patient was found to be hypotensive and was started on broad-spectrum antibiotics including doxycycline for history of tick bites.  She also was started on pressors and was admitted to ICU.   Assessment & Plan:   Principal Problem:   Severe sepsis with septic shock (HCC) Active Problems:   Celiac disease   Bacterial overgrowth syndrome   Tick bites   AKI (acute kidney injury) ([place])   Hyponatremia   Thrombocytopenia (HCC)   Septic shock (HCC)   Septic shock with fever/hypotension/lactic acidosis -Questionable cause.  No source of infection found yet.  Unclear if this is bacterial versus viral.  Currently still on a small dose of vasopressor.  Blood pressure improving.  Critical care following, planning to wean off vasopressor.  Cultures negative so far -Currently on broad-spectrum antibiotics including vancomycin Zosyn and doxycycline.  Doxycycline is for probable tickborne disease as patient has history of multiple tick bites.  [facility] spotted fever and Lyme serology pending.  Continue vancomycin.  Continue Zosyn for now for possible cholecystitis -Continue IV fluids. -Stool for C. difficile and GI PCR negative.  Diarrhea improving  Question of acute cholecystitis -Abdomen was slightly tender yesterday but is improving.  Unclear if the gallbladder thickening is from acute cholecystitis.  Call general surgery for consult.  LFTs  improving  Acute kidney injury -Probably secondary to above. -Improved.  Thrombocytopenia -Probably secondary to above.  Improving.  No signs of bleeding.  Repeat a.m. labs  Hyponatremia -Probably secondary to poor oral intake.  Resolved with IV fluids.  History of tick bites -Patient has history of multiple tick bites which is not a new thing for [place]  No evidence of rash or purpura.  Doubt that patient has [facility] spotted fever or Lyme's disease.  Await serologies.  History of celiac disease/bacterial overgrowth syndrome -Recently treated with Bactrim for bacterial overgrowth syndrome -Outpatient follow-up with gastroenterologist at [facility]  Hypokalemia -Resolved.  Repeat a.m. labs  Elevated LFTs -Probably secondary to sepsis versus cholecystitis.   -LFTs improving.  General surgery consult.  DVT prophylaxis: SCDs Code Status: Partial Family Communication: None at bedside Disposition Plan: Probable home in 1 to 3 days depending on clinical outcome  Consultants: PCCM/general surgery  Procedures: None  Antimicrobials: Vancomycin/Zosyn and doxycycline from 09/16/2017 onwards   Subjective: Patient seen and examined at bedside.  She feels much better today compared to yesterday.  She has more energy.  She had no fever, overnight nausea/vomiting.  Her diarrhea is improving.  Her abdominal pain is improving.  Objective: Vitals:   09/18/17 0500 09/18/17 0600 09/18/17 0700 09/18/17 0800  BP: (!) 95/48 (!) 97/50 (!) 95/48 (!) 95/50  Pulse: 62 68 67 71  Resp: (!) 23 14 (!) 21 20  Temp:    98.6 F (37 C)  TempSrc:    Oral  SpO2: 97% 97% 94% 99%  Weight:      Height:  Intake/Output Summary (Last 24 hours) at 09/18/2017 0814 Last data filed at 09/18/2017 0800 Gross per 24 hour  Intake 3034.33 ml  Output 550 ml  Net 2484.33 ml   Filed Weights   09/16/17 0941 09/16/17 1516  Weight: 64.9 kg (143 lb) 66.4 kg (146 lb 6.2  oz)    Examination:  General exam: Appears calm and comfortable.  No distress  respiratory system: Bilateral decreased breath sounds at bases Cardiovascular system: Rate controlled, S1-S2 positive Gastrointestinal system: Abdomen is nondistended, soft and mildly tender in the right upper quadrant, improved since yesterday. Normal bowel sounds heard. Central nervous system: Alert and oriented. No focal neurological deficits. Moving extremities Extremities: No cyanosis, clubbing, edema  Skin: No rashes, petechiae Psychiatry: Judgement and insight appear normal.    Data Reviewed: I have personally reviewed following labs and imaging studies  CBC: Recent Labs  Lab 09/16/17 0954 09/17/17 0331 09/18/17 0316  WBC 6.3 5.4 5.2  NEUTROABS 5.4  --  1.9  HGB 11.9* 9.8* 9.3*  HCT 33.8* 28.0* 27.6*  MCV 90.1 91.5 91.1  PLT 72* 75* 194*   Basic Metabolic Panel: Recent Labs  Lab 09/16/17 0954 09/17/17 0331 09/18/17 0316  NA 129* 136 138  K 3.5 3.2* 3.5  CL 99* 113* 114*  CO2 21* 18* 19*  GLUCOSE 140* 106* 110*  BUN 25* 14 12  CREATININE 1.34* 0.89 0.83  CALCIUM 8.2* 7.3* 8.0*  MG  --   --  1.7   GFR: Estimated Creatinine Clearance: 70.8 mL/min (by C-G formula based on SCr of 0.83 mg/dL). Liver Function Tests: Recent Labs  Lab 09/16/17 0954 09/17/17 0331 09/18/17 0316  AST 52* 101* 77*  ALT 33 62* 51  ALKPHOS 63 70 69  BILITOT 0.4 0.2* 0.5  PROT 6.8 5.1* 4.9*  ALBUMIN 3.5 2.6* 2.4*   Recent Labs  Lab 09/16/17 0954  LIPASE 26   No results for input(s): AMMONIA in the last 168 hours. Coagulation Profile: No results for input(s): INR, PROTIME in the last 168 hours. Cardiac Enzymes: Recent Labs  Lab 09/16/17 0954  TROPONINI <0.03   BNP (last 3 results) No results for input(s): PROBNP in the last 8760 hours. HbA1C: No results for input(s): HGBA1C in the last 72 hours. CBG: No results for input(s): GLUCAP in the last 168 hours. Lipid Profile: No results for  input(s): CHOL, HDL, LDLCALC, TRIG, CHOLHDL, LDLDIRECT in the last 72 hours. Thyroid Function Tests: No results for input(s): TSH, T4TOTAL, FREET4, T3FREE, THYROIDAB in the last 72 hours. Anemia Panel: No results for input(s): VITAMINB12, FOLATE, FERRITIN, TIBC, IRON, RETICCTPCT in the last 72 hours. Sepsis Labs: Recent Labs  Lab 09/16/17 0957 09/16/17 1158 09/16/17 1620 09/17/17 0331  PROCALCITON  --   --  0.98  --   LATICACIDVEN 2.14* 2.67* 2.3* 1.6    Recent Results (from the past 240 hour(s))  Blood Culture (routine x 2)     Status: None (Preliminary result)   Collection Time: 09/16/17  9:54 AM  Result Value Ref Range Status   Specimen Description   Final    BLOOD LEFT ARM Performed at [facility], [address] [place], [place] [address]    Special Requests   Final    BOTTLES DRAWN AEROBIC AND ANAEROBIC Blood Culture adequate volume Performed at [facility], [address] [place], [place] [address]    Culture   Final    NO GROWTH < 24 HOURS Performed at [facility], [place] [address], [place], [place] [address]    Report Status PENDING  Incomplete  Blood Culture (routine x 2)     Status: None (Preliminary result)   Collection Time: 09/16/17 10:44 AM  Result Value Ref Range Status   Specimen Description   Final    BLOOD RIGHT ARM Performed at [facility], [address] [place], [place] [address]    Special Requests   Final    BOTTLES DRAWN AEROBIC AND ANAEROBIC Blood Culture adequate volume Performed at [facility], [address] [place], [place] [address]    Culture   Final    NO GROWTH < 24 HOURS Performed at [facility], [place] [address] [place], [place] [address]    Report Status PENDING  Incomplete  Urine culture     Status: None   Collection Time: 09/16/17 11:27 AM  Result Value Ref Range Status   Specimen Description   Final    URINE, RANDOM Performed at [facility], [address] [place], [place] [address]    Special Requests   Final    NONE Performed at [facility], [address] [place], [place] [address]    Culture   Final    NO GROWTH Performed at [facility], [place] [address], [place], [place] [address]    Report Status 09/17/2017 FINAL  Final  MRSA PCR Screening     Status: None   Collection Time: 09/16/17  3:39 PM  Result Value Ref Range Status   MRSA by PCR NEGATIVE NEGATIVE Final    Comment:        The GeneXpert MRSA Assay (FDA approved for NASAL specimens only), is one component of a comprehensive MRSA colonization surveillance program. It is not intended to diagnose MRSA infection nor to guide or monitor treatment for MRSA infections. Performed at [facility], [place] [address] [place], [place] [address]   Gastrointestinal Panel by PCR , Stool     Status: None   Collection Time: 09/17/17  9:20 AM  Result Value Ref Range Status   Campylobacter species NOT DETECTED NOT DETECTED Final   Plesimonas shigelloides NOT DETECTED NOT DETECTED Final   Salmonella species NOT DETECTED NOT DETECTED Final   Yersinia enterocolitica NOT DETECTED NOT DETECTED Final   Vibrio species NOT DETECTED NOT DETECTED Final   Vibrio cholerae NOT DETECTED NOT DETECTED Final   Enteroaggregative E coli (EAEC) NOT DETECTED NOT DETECTED Final   Enteropathogenic E coli (EPEC) NOT DETECTED NOT DETECTED Final   Enterotoxigenic E coli (ETEC) NOT DETECTED NOT DETECTED Final   Shiga like toxin producing E coli (STEC) NOT DETECTED NOT DETECTED Final   Shigella/Enteroinvasive E coli (EIEC) NOT DETECTED NOT DETECTED Final   Cryptosporidium NOT DETECTED NOT DETECTED Final   Cyclospora cayetanensis NOT DETECTED NOT DETECTED Final   Entamoeba histolytica NOT DETECTED NOT DETECTED Final   Giardia lamblia NOT DETECTED NOT DETECTED Final   Adenovirus F40/41 NOT DETECTED NOT DETECTED Final   Astrovirus NOT DETECTED NOT DETECTED Final    Norovirus GI/GII NOT DETECTED NOT DETECTED Final   Rotavirus A NOT DETECTED NOT DETECTED Final   Sapovirus (I, II, IV, and V) NOT DETECTED NOT DETECTED Final    Comment: Performed at [facility], [address] [place], [place] [address]  C difficile quick scan w PCR reflex     Status: None   Collection  Time: 09/17/17  9:20 AM  Result Value Ref Range Status   C Diff antigen NEGATIVE NEGATIVE Final   C Diff toxin NEGATIVE NEGATIVE Final   C Diff interpretation No C. difficile detected.  Final    Comment: Performed at [facility], [place] [address] [place], [place] [address]         Radiology Studies: Dg Chest 2 View  Result Date: 09/16/2017 CLINICAL DATA:  Cough and fever EXAM: CHEST - 2 VIEW COMPARISON:  December 07, 2015 FINDINGS: There is no edema or consolidation. The heart size and pulmonary vascularity are normal. No adenopathy. No evident bone lesions. IMPRESSION: No edema or consolidation. Electronically Signed   By: Lowella Grip III M.D.   On: 09/16/2017 10:57   US Abdomen Limited Ruq  Result Date: 09/17/2017 CLINICAL DATA:  Elevated LFT.  Abdominal pain EXAM: ULTRASOUND ABDOMEN LIMITED RIGHT UPPER QUADRANT COMPARISON:  MRI liver 12/13/2016.  Ultrasound abdomen 11/09/2016 FINDINGS: Gallbladder: Gallbladder distended. Negative for gallstones. Gallbladder wall mildly thickened 4.2 mm. Negative sonographic Murphy sign. Small amount of pericholecystic fluid. Previously noted small lesion in the fundus of the gallbladder on MRI not visualized by ultrasound. Common bile duct: Diameter: 2.6 mm Liver: No focal lesion identified. Within normal limits in parenchymal echogenicity. Portal vein is patent on color Doppler imaging with normal direction of blood flow towards the liver. IMPRESSION: Distended gallbladder with gallbladder wall thickening and mild pericholecystic fluid. Negative for gallstones or gallbladder pain. Question cholecystitis symptoms.  Electronically Signed   By: Franchot Gallo M.D.   On: 09/17/2017 19:02        Scheduled Meds: . zonisamide  100 mg Oral BID   Continuous Infusions: . sodium chloride 75 mL/hr at 09/18/17 0600  . doxycycline (VIBRAMYCIN) IV Stopped (09/17/17 2317)  . phenylephrine (NEO-SYNEPHRINE) Adult infusion 10 mcg/min (09/18/17 0600)  . piperacillin-tazobactam (ZOSYN)  IV 3.375 g (09/18/17 0544)     LOS: 2 days        Aline August, MD [facility] Pager [phone]  If 7PM-7AM, please contact night-coverage [email or web address] Password [email or web address] 09/18/2017, 8:14 AM

## 2017-09-19 DIAGNOSIS — E871 Hypo-osmolality and hyponatremia: Secondary | ICD-10-CM

## 2017-09-19 DIAGNOSIS — K900 Celiac disease: Secondary | ICD-10-CM

## 2017-09-19 DIAGNOSIS — W57XXXA Bitten or stung by nonvenomous insect and other nonvenomous arthropods, initial encounter: Secondary | ICD-10-CM

## 2017-09-19 DIAGNOSIS — D696 Thrombocytopenia, unspecified: Secondary | ICD-10-CM

## 2017-09-19 LAB — CBC WITH DIFFERENTIAL/PLATELET
BASOS PCT: 0 %
Band Neutrophils: 0 %
Basophils Absolute: 0 10*3/uL (ref 0.0–0.1)
Blasts: 0 %
EOS PCT: 0 %
Eosinophils Absolute: 0 10*3/uL (ref 0.0–0.7)
HCT: 25.4 % — ABNORMAL LOW (ref 36.0–46.0)
HEMOGLOBIN: 8.8 g/dL — AB (ref 12.0–15.0)
LYMPHS ABS: 2.5 10*3/uL (ref 0.7–4.0)
LYMPHS PCT: 57 %
MCH: 31.2 pg (ref 26.0–34.0)
MCHC: 34.6 g/dL (ref 30.0–36.0)
MCV: 90.1 fL (ref 78.0–100.0)
MONO ABS: 0.4 10*3/uL (ref 0.1–1.0)
Metamyelocytes Relative: 0 %
Monocytes Relative: 10 %
Myelocytes: 0 %
NEUTROS PCT: 33 %
NRBC: 0 /100{WBCs}
Neutro Abs: 1.4 10*3/uL — ABNORMAL LOW (ref 1.7–7.7)
OTHER: 0 %
PROMYELOCYTES RELATIVE: 0 %
Platelets: 143 10*3/uL — ABNORMAL LOW (ref 150–400)
RBC: 2.82 MIL/uL — AB (ref 3.87–5.11)
RDW: 14.7 % (ref 11.5–15.5)
WBC: 4.3 10*3/uL (ref 4.0–10.5)

## 2017-09-19 LAB — COMPREHENSIVE METABOLIC PANEL
ALK PHOS: 58 U/L (ref 38–126)
ALT: 44 U/L (ref 14–54)
ANION GAP: 7 (ref 5–15)
AST: 56 U/L — ABNORMAL HIGH (ref 15–41)
Albumin: 2.3 g/dL — ABNORMAL LOW (ref 3.5–5.0)
BUN: 9 mg/dL (ref 6–20)
CALCIUM: 8 mg/dL — AB (ref 8.9–10.3)
CO2: 19 mmol/L — AB (ref 22–32)
CREATININE: 0.72 mg/dL (ref 0.44–1.00)
Chloride: 115 mmol/L — ABNORMAL HIGH (ref 101–111)
Glucose, Bld: 95 mg/dL (ref 65–99)
Potassium: 2.9 mmol/L — ABNORMAL LOW (ref 3.5–5.1)
SODIUM: 141 mmol/L (ref 135–145)
TOTAL PROTEIN: 4.7 g/dL — AB (ref 6.5–8.1)
Total Bilirubin: 0.6 mg/dL (ref 0.3–1.2)

## 2017-09-19 LAB — BASIC METABOLIC PANEL
Anion gap: 4 — ABNORMAL LOW (ref 5–15)
BUN: 9 mg/dL (ref 6–20)
CHLORIDE: 114 mmol/L — AB (ref 101–111)
CO2: 22 mmol/L (ref 22–32)
Calcium: 7.8 mg/dL — ABNORMAL LOW (ref 8.9–10.3)
Creatinine, Ser: 0.71 mg/dL (ref 0.44–1.00)
GFR calc Af Amer: >60 mL/min (ref >60)
GFR calc non Af Amer: >60 mL/min (ref >60)
Glucose, Bld: 112 mg/dL — ABNORMAL HIGH (ref 65–99)
POTASSIUM: 3.4 mmol/L — AB (ref 3.5–5.1)
SODIUM: 140 mmol/L (ref 135–145)

## 2017-09-19 LAB — MAGNESIUM: Magnesium: 1.9 mg/dL (ref 1.7–2.4)

## 2017-09-19 MED ORDER — FLORANEX PO PACK
1.0000 g | PACK | Freq: Three times a day (TID) | ORAL | Status: DC
  Administered 2017-09-19: 1 g via ORAL
  Filled 2017-09-19 (×2): qty 1

## 2017-09-19 MED ORDER — PANTOPRAZOLE SODIUM 40 MG PO TBEC
40.0000 mg | DELAYED_RELEASE_TABLET | Freq: Every day | ORAL | Status: DC
  Administered 2017-09-19: 40 mg via ORAL
  Filled 2017-09-19: qty 1

## 2017-09-19 MED ORDER — PROMETHAZINE HCL 25 MG PO TABS: 25.0000 mg | ORAL_TABLET | Freq: Four times a day (QID) | ORAL | Status: DC | PRN

## 2017-09-19 MED ORDER — BACID PO TABS
2.0000 | ORAL_TABLET | Freq: Three times a day (TID) | ORAL | Status: DC
  Filled 2017-09-19 (×2): qty 2

## 2017-09-19 MED ORDER — DIPHENOXYLATE-ATROPINE 2.5-0.025 MG PO TABS
2.0000 | ORAL_TABLET | Freq: Once | ORAL | Status: AC
  Administered 2017-09-19: 2 via ORAL
  Filled 2017-09-19: qty 2

## 2017-09-19 MED ORDER — FLUCONAZOLE 100 MG PO TABS: 100.0000 mg | ORAL_TABLET | Freq: Once | ORAL | 3 refills | Status: AC

## 2017-09-19 MED ORDER — DIPHENOXYLATE-ATROPINE 2.5-0.025 MG PO TABS: 1.0000 | ORAL_TABLET | Freq: Two times a day (BID) | ORAL | 1 refills | Status: DC | PRN

## 2017-09-19 MED ORDER — POTASSIUM CHLORIDE CRYS ER 20 MEQ PO TBCR
40.0000 meq | EXTENDED_RELEASE_TABLET | ORAL | Status: AC
Start: 2017-09-19 — End: 2017-09-19
  Administered 2017-09-19 (×2): 40 meq via ORAL
  Filled 2017-09-19 (×2): qty 2

## 2017-09-19 NOTE — Progress Notes (Signed)
NUTRITION NOTE  Consult received for patient with hx of Celiac disease and request for information concerning appropriate foods at this time. Patient reports that she previously tried yogurt, cottage cheese, eggs, and breakfast potatoes and that MD informed her that none of these were good options.  Talked with patient and her husband, who is at bedside. Talked with them about the pro-inflammatory nature of diary and encouraged patient that a cup of yogurt a day would be okay given the benefit of added probiotics (she currently takes a probiotic BID, once in the AM and once in the evening) but to try to avoid milk or cheese at this time. Also talked with them about eggs and potatoes being okay but that these items should be prepared/cooked without oil or any type of fat; discussed that fat is the macronutrient with the slowest transit time which can increase GI irritation.   Discussed consuming the following foods: soft cooked vegetables, soft fruit such as peaches and bananas, potatoes prepared without any fat source, eggs prepared without any fat source, gluten-free hot breakfast cereal (patient has a variety from [employer] brand), gluten-free pancakes/waffles, creamy peanut butter or almond butter (suggested [employer] brand as the only ingredient is the nuts, no additives). Encouraged adequate fluid intake and encouraged water, low-sugar version of Gatorade or Propel which could be further diluted (if desired), and that if she is drinking juice to opt for apple juice or cranberry juice and to avoid orange juice at this time.  Patient and husband both very appreciative and are aware of ability to request RD to return prior to d/c should they have further questions or concerns. Patient reports that she believes she is going to be discharged today.       Jarome Matin, MS, RD, LDN, [facility] Inpatient Clinical Dietitian Pager # [phone] After hours/weekend pager # [phone]

## 2017-09-19 NOTE — Progress Notes (Signed)
Pt requesting a one dose medication for yeast infections prescription for discharge. Paged Dr Wynelle Cleveland  With pt's request.

## 2017-09-19 NOTE — Discharge Summary (Signed)
Physician Discharge Summary  Katelyn Mcclain KKX:[id number] DOB: May 29, 1950 DOA: 09/16/2017  PCP: Jani Gravel, MD  Admit date: 09/16/2017 Discharge date: 09/19/2017  Admitted From: home Disposition:  home   Recommendations for Outpatient Follow-up:  1. F/u Bmet and CBC on next visit  Discharge Condition:  stable   CODE STATUS:  Intubation and BiPAP/ CPAP only Consultations:  PCCM    Discharge Diagnoses:  Principal Problem:   Severe sepsis with septic shock ([place]) Active Problems:   Celiac disease   Bacterial overgrowth syndrome   Tick bites   AKI (acute kidney injury) ([place])   Hyponatremia   Thrombocytopenia [facility])    Hospital course:  Katelyn Mcclain  is a 67 y.o. female with medical history significant of celiac disease, bacterial overgrowth syndrome, GERD, migraine headaches who presented to the [facility] with complaints of fever, nausea and vomiting for 1 week.  Patient was increasingly weak and had not eaten anything because of severe loss of appetite.  Patient was recently seen by her PCP and was treated with Levaquin for fever.  She was also recently treated with Bactrim for bacterial overgrowth syndrome  by her gastroenterologist at [facility]. She was still on Levaquin when her symptoms occurred.   Noted to have fever of 102.2 in ED. Lactic acid 2.14. Given 3 L IVF in [facility] but BP continued to be low and Lactic acid went to 2.7. PCCM was consulted and she was started on pressors. Also noted to have WBC clumps in urine.    Subjective: Having a BM soon after she eats. No abdominal pain or nausea. No blood in stool.   Assessment & Plan:   Principal Problem:   Severe sepsis with septic shock  - treated with neosynephrine, Zosyn, Vanc and Doxy (tick bites) - Urine and blood cultures negative  - B burgdorfi neg - RMSF IGG + but  Ig M neg - C diff neg and GI pathogen panel neg - MRSA PCR neg - fever curve improved- last temp was 100.3 on 6/10 - no source  found as of yet- there was a suspicion of acute acalculous cholecystitis based on RUQ pain and an ultrasound showing wall thickening of 6m. Murphy's sign negative - General surgery feels that  She does not have cholecystitis - I have spoken with Dr KMaudie Mercurywho states he started the Levaquin for possible UTI- the culture he obtained did not grow anything and neither did ours - at this point, I suspect it may have been a viral infection- will stop antibiotics (she has had at least 7 days if the Levaquin is included)  Active Problems:   Celiac disease   Bacterial overgrowth syndrome - treated- take a gluten free, low fat diet for now- PRN Lomotil can be used as she is having Bm after every meal and feels the food is running through her - I have also resumed her probiotic as wel - return to GI doc if symptoms persist   AKI (acute kidney injury)  - Hyponatremia - resolved with hydration  Hypokalemia - K 2.9 improved to 3.4 with 80 meq of K today - will give 40 Meq more prior to d/c     Thrombocytopenia - likely due to above illnss- improving now and is 143 today  Discharge Exam: Vitals:   09/19/17 1520 09/19/17 1600  BP:    Pulse: 72   Resp: 16   Temp:  98.4 F (36.9 C)  SpO2: 100%    Vitals:  09/19/17 1513 09/19/17 1514 09/19/17 1520 09/19/17 1600  BP: (!) 96/55     Pulse: 70 64 72   Resp: 19 19 16    Temp:    98.4 F (36.9 C)  TempSrc:    Oral  SpO2: 99% 100% 100%   Weight:      Height:        General: Pt is alert, awake, not in acute distress Cardiovascular: RRR, S1/S2 +, no rubs, no gallops Respiratory: CTA bilaterally, no wheezing, no rhonchi Abdominal: Soft, NT, ND, bowel sounds + Extremities: no edema, no cyanosis   Discharge Instructions  Discharge Instructions    Diet - low sodium heart healthy   Complete by:  As directed    Take low fat small meals 4-5 x day   Increase activity slowly   Complete by:  As directed      Allergies as of 09/19/2017       Reactions   Adhesive [tape] Other (See Comments)   Tear's skin off    Morphine And Related Other (See Comments)   Sensitive to narcotics, worsening migraines.   Clonazepam Other (See Comments)   Caused anxiety   Linaclotide Other (See Comments)   Caused spastic bladder (reaction to Linzess)   Other    Pt does not use artificial sweeteners      Medication List    STOP taking these medications   levofloxacin 500 MG tablet Commonly known as:  LEVAQUIN   Sennosides 25 MG Tabs   sulfamethoxazole-trimethoprim 400-80 MG tablet Commonly known as:  BACTRIM,SEPTRA     TAKE these medications   acetaminophen 500 MG tablet Commonly known as:  TYLENOL Take 500 mg by mouth every 6 (six) hours as needed (pain).   albuterol 108 (90 Base) MCG/ACT inhaler Commonly known as:  PROVENTIL HFA;VENTOLIN HFA Inhale 1 puff into the lungs every 6 (six) hours as needed for wheezing or shortness of breath (seasonal allergies).   CALCIUM 1200+D3 PO Take 1,200 mg by mouth daily.   cycloSPORINE 0.05 % ophthalmic emulsion Commonly known as:  RESTASIS Place 1 drop into both eyes at bedtime.   dicyclomine 20 MG tablet Commonly known as:  BENTYL Take 20 mg by mouth 2 (two) times daily as needed for spasms (stomach pain).   diphenoxylate-atropine 2.5-0.025 MG tablet Commonly known as:  LOMOTIL Take 1 tablet by mouth 2 (two) times daily as needed for diarrhea or loose stools (stop taking in 3 days if it does not work).   docusate sodium 100 MG capsule Commonly known as:  COLACE Take 100 mg by mouth 2 (two) times daily.   eszopiclone 3 MG Tabs Generic drug:  Eszopiclone Take 3 mg by mouth at bedtime as needed (sleep). Take immediately before bedtime   GARLIQUE PO Take 1 capsule by mouth daily.   MONISTAT 7 VA Place 1 application vaginally at bedtime as needed (yeast infection).   montelukast 10 MG tablet Commonly known as:  SINGULAIR Take 10 mg by mouth at bedtime.   omeprazole 40 MG  capsule Commonly known as:  PRILOSEC Take 40 mg by mouth daily.   ondansetron 4 MG tablet Commonly known as:  ZOFRAN TAKE 1 TABLET BY MOUTH EVERY 6 HOURS AS NEEDED FOR NAUSEA - IF PHERERGAN NOT WORKING   OVER THE COUNTER MEDICATION Take 500 mg by mouth daily. Vitamin B5   OVER THE COUNTER MEDICATION Take 500 mg by mouth daily. Berberine   PREMARIN VA Place 1 application vaginally once a week.  PROBIOTIC DAILY PO Take 1 tablet by mouth 2 (two) times daily.   promethazine 25 MG tablet Commonly known as:  PHENERGAN Take 1 tablet (25 mg total) by mouth every 6 (six) hours as needed for nausea or vomiting.   pyridoxine 200 MG tablet Commonly known as:  B-6 Take 200 mg by mouth daily.   rizatriptan 10 MG disintegrating tablet Commonly known as:  MAXALT-MLT Take 1 tablet PO at the onset of migraine and May repeat in 2 hours if needed. Not to exceed 2 tabs/24 hours.   TRINTELLIX 20 MG Tabs tablet Generic drug:  vortioxetine HBr Take 20 mg by mouth at bedtime.   zonisamide 100 MG capsule Commonly known as:  ZONEGRAN Take 1 capsule (100 mg total) by mouth 2 (two) times daily.   zonisamide 25 MG capsule Commonly known as:  ZONEGRAN TAKE 2 CAPSULE EVERY EVENING WITH 100 MG CAPSULE TO MAKE 150 MG NIGHTLY DOSE.      Follow-up Information    Jani Gravel, MD Follow up.   Specialty:  Internal Medicine Why:  see on Monday at 4:45- ask him to do a Bmet and CBC please.  Contact information: [address] [place] [address] [phone]          Allergies  Allergen Reactions  . Adhesive [Tape] Other (See Comments)    Tear's skin off   . Morphine And Related Other (See Comments)    Sensitive to narcotics, worsening migraines.  . Clonazepam Other (See Comments)    Caused anxiety  . Linaclotide Other (See Comments)    Caused spastic bladder (reaction to Linzess)  . Other     Pt does not use artificial sweeteners      Procedures/Studies:    Dg  Chest 2 View  Result Date: 09/16/2017 CLINICAL DATA:  Cough and fever EXAM: CHEST - 2 VIEW COMPARISON:  December 07, 2015 FINDINGS: There is no edema or consolidation. The heart size and pulmonary vascularity are normal. No adenopathy. No evident bone lesions. IMPRESSION: No edema or consolidation. Electronically Signed   By: Lowella Grip III M.D.   On: 09/16/2017 10:57   US Abdomen Limited Ruq  Result Date: 09/17/2017 CLINICAL DATA:  Elevated LFT.  Abdominal pain EXAM: ULTRASOUND ABDOMEN LIMITED RIGHT UPPER QUADRANT COMPARISON:  MRI liver 12/13/2016.  Ultrasound abdomen 11/09/2016 FINDINGS: Gallbladder: Gallbladder distended. Negative for gallstones. Gallbladder wall mildly thickened 4.2 mm. Negative sonographic Murphy sign. Small amount of pericholecystic fluid. Previously noted small lesion in the fundus of the gallbladder on MRI not visualized by ultrasound. Common bile duct: Diameter: 2.6 mm Liver: No focal lesion identified. Within normal limits in parenchymal echogenicity. Portal vein is patent on color Doppler imaging with normal direction of blood flow towards the liver. IMPRESSION: Distended gallbladder with gallbladder wall thickening and mild pericholecystic fluid. Negative for gallstones or gallbladder pain. Question cholecystitis symptoms. Electronically Signed   By: Franchot Gallo M.D.   On: 09/17/2017 19:02     The results of significant diagnostics from this hospitalization (including imaging, microbiology, ancillary and laboratory) are listed below for reference.     Microbiology: Recent Results (from the past 240 hour(s))  Blood Culture (routine x 2)     Status: None (Preliminary result)   Collection Time: 09/16/17  9:54 AM  Result Value Ref Range Status   Specimen Description   Final    BLOOD LEFT ARM Performed at [facility], [address] [place], [place] [address]  Special Requests   Final    BOTTLES DRAWN AEROBIC AND ANAEROBIC Blood Culture adequate  volume Performed at [facility], [address] [place], [place] [address]    Culture   Final    NO GROWTH 3 DAYS Performed at [facility], [place] [address], [place], [place] [address]    Report Status PENDING  Incomplete  Blood Culture (routine x 2)     Status: None (Preliminary result)   Collection Time: 09/16/17 10:44 AM  Result Value Ref Range Status   Specimen Description   Final    BLOOD RIGHT ARM Performed at [facility], [address] [place], [place] [address]    Special Requests   Final    BOTTLES DRAWN AEROBIC AND ANAEROBIC Blood Culture adequate volume Performed at [facility], [address] [place], [place] [address]    Culture   Final    NO GROWTH 3 DAYS Performed at [facility], [place] [address] [place], [place] [address]    Report Status PENDING  Incomplete  Urine culture     Status: None   Collection Time: 09/16/17 11:27 AM  Result Value Ref Range Status   Specimen Description   Final    URINE, RANDOM Performed at [facility], [address] [place], [place] [address]    Special Requests   Final    NONE Performed at [facility], [address] [place], [place] [address]    Culture   Final    NO GROWTH Performed at [facility], [place] [address] [place], [place] [address]    Report Status 09/17/2017 FINAL  Final  MRSA PCR Screening     Status: None   Collection Time: 09/16/17  3:39 PM  Result Value Ref Range Status   MRSA by PCR NEGATIVE NEGATIVE Final    Comment:        The GeneXpert MRSA Assay (FDA approved for NASAL specimens only), is one component of a comprehensive MRSA colonization surveillance program. It is not intended to diagnose MRSA infection nor to guide or monitor treatment for MRSA infections. Performed at [facility], [place] [address] [place],  [address]   Gastrointestinal Panel by PCR , Stool     Status: None    Collection Time: 09/17/17  9:20 AM  Result Value Ref Range Status   Campylobacter species NOT DETECTED NOT DETECTED Final   Plesimonas shigelloides NOT DETECTED NOT DETECTED Final   Salmonella species NOT DETECTED NOT DETECTED Final   Yersinia enterocolitica NOT DETECTED NOT DETECTED Final   Vibrio species NOT DETECTED NOT DETECTED Final   Vibrio cholerae NOT DETECTED NOT DETECTED Final   Enteroaggregative E coli (EAEC) NOT DETECTED NOT DETECTED Final   Enteropathogenic E coli (EPEC) NOT DETECTED NOT DETECTED Final   Enterotoxigenic E coli (ETEC) NOT DETECTED NOT DETECTED Final   Shiga like toxin producing E coli (STEC) NOT DETECTED NOT DETECTED Final   Shigella/Enteroinvasive E coli (EIEC) NOT DETECTED NOT DETECTED Final   Cryptosporidium NOT DETECTED NOT DETECTED Final   Cyclospora cayetanensis NOT DETECTED NOT DETECTED Final   Entamoeba histolytica NOT DETECTED NOT DETECTED Final   Giardia lamblia NOT DETECTED NOT DETECTED Final   Adenovirus F40/41 NOT DETECTED NOT DETECTED Final   Astrovirus NOT DETECTED NOT DETECTED Final   Norovirus GI/GII NOT DETECTED NOT DETECTED Final   Rotavirus A NOT DETECTED NOT  DETECTED Final   Sapovirus (I, II, IV, and V) NOT DETECTED NOT DETECTED Final    Comment: Performed at [facility], [address] [place], [place] [address]  C difficile quick scan w PCR reflex     Status: None   Collection Time: 09/17/17  9:20 AM  Result Value Ref Range Status   C Diff antigen NEGATIVE NEGATIVE Final   C Diff toxin NEGATIVE NEGATIVE Final   C Diff interpretation No C. difficile detected.  Final    Comment: Performed at [facility], [place] [address], [place], [place] [address]     Labs: BNP (last 3 results) No results for input(s): BNP in the last 8760 hours. Basic Metabolic Panel: Recent Labs  Lab 09/16/17 0954 09/17/17 0331 09/18/17 0316 09/19/17 0308 09/19/17 1503  NA 129* 136 138 141 140  K 3.5 3.2* 3.5 2.9* 3.4*  CL  99* 113* 114* 115* 114*  CO2 21* 18* 19* 19* 22  GLUCOSE 140* 106* 110* 95 112*  BUN 25* 14 12 9 9   CREATININE 1.34* 0.89 0.83 0.72 0.71  CALCIUM 8.2* 7.3* 8.0* 8.0* 7.8*  MG  --   --  1.7 1.9  --    Liver Function Tests: Recent Labs  Lab 09/16/17 0954 09/17/17 0331 09/18/17 0316 09/19/17 0308  AST 52* 101* 77* 56*  ALT 33 62* 51 44  ALKPHOS 63 70 69 58  BILITOT 0.4 0.2* 0.5 0.6  PROT 6.8 5.1* 4.9* 4.7*  ALBUMIN 3.5 2.6* 2.4* 2.3*   Recent Labs  Lab 09/16/17 0954  LIPASE 26   No results for input(s): AMMONIA in the last 168 hours. CBC: Recent Labs  Lab 09/16/17 0954 09/17/17 0331 09/18/17 0316 09/19/17 0308  WBC 6.3 5.4 5.2 4.3  NEUTROABS 5.4  --  1.9 1.4*  HGB 11.9* 9.8* 9.3* 8.8*  HCT 33.8* 28.0* 27.6* 25.4*  MCV 90.1 91.5 91.1 90.1  PLT 72* 75* 106* 143*   Cardiac Enzymes: Recent Labs  Lab 09/16/17 0954  TROPONINI <0.03   BNP: Invalid input(s): POCBNP CBG: No results for input(s): GLUCAP in the last 168 hours. D-Dimer No results for input(s): DDIMER in the last 72 hours. Hgb A1c No results for input(s): HGBA1C in the last 72 hours. Lipid Profile No results for input(s): CHOL, HDL, LDLCALC, TRIG, CHOLHDL, LDLDIRECT in the last 72 hours. Thyroid function studies No results for input(s): TSH, T4TOTAL, T3FREE, THYROIDAB in the last 72 hours.  Invalid input(s): FREET3 Anemia work up No results for input(s): VITAMINB12, FOLATE, FERRITIN, TIBC, IRON, RETICCTPCT in the last 72 hours. Urinalysis    Component Value Date/Time   COLORURINE YELLOW 09/16/2017 [place] 09/16/2017 1127   LABSPEC 1.020 09/16/2017 1127   PHURINE 6.0 09/16/2017 1127   GLUCOSEU NEGATIVE 09/16/2017 1127   HGBUR NEGATIVE 09/16/2017 1127   BILIRUBINUR NEGATIVE 09/16/2017 1127   KETONESUR NEGATIVE 09/16/2017 1127   PROTEINUR 30 (A) 09/16/2017 1127   UROBILINOGEN 1.0 03/12/2011 1939   NITRITE NEGATIVE 09/16/2017 1127   LEUKOCYTESUR NEGATIVE 09/16/2017 1127    Sepsis Labs Invalid input(s): PROCALCITONIN,  WBC,  LACTICIDVEN Microbiology Recent Results (from the past 240 hour(s))  Blood Culture (routine x 2)     Status: None (Preliminary result)   Collection Time: 09/16/17  9:54 AM  Result Value Ref Range Status   Specimen Description   Final    BLOOD LEFT ARM Performed at [facility], [address] [place], [place] [address]  Special Requests   Final    BOTTLES DRAWN AEROBIC AND ANAEROBIC Blood Culture adequate volume Performed at [facility], [address] [place], [place] [address]    Culture   Final    NO GROWTH 3 DAYS Performed at [facility], [place] [address], [place], [place] [address]    Report Status PENDING  Incomplete  Blood Culture (routine x 2)     Status: None (Preliminary result)   Collection Time: 09/16/17 10:44 AM  Result Value Ref Range Status   Specimen Description   Final    BLOOD RIGHT ARM Performed at [facility], [address] [place], [place] [address]    Special Requests   Final    BOTTLES DRAWN AEROBIC AND ANAEROBIC Blood Culture adequate volume Performed at [facility], [address] [place], [place] [address]    Culture   Final    NO GROWTH 3 DAYS Performed at [facility], [place] [address] [place], [place] [address]    Report Status PENDING  Incomplete  Urine culture     Status: None   Collection Time: 09/16/17 11:27 AM  Result Value Ref Range Status   Specimen Description   Final    URINE, RANDOM Performed at [facility], [address] [place], [place] [address]    Special Requests   Final    NONE Performed at [facility], [address] [place], [place] [address]    Culture   Final    NO GROWTH Performed at [facility], [place] [address] [place], [place] [address]    Report Status 09/17/2017 FINAL  Final  MRSA PCR Screening     Status: None   Collection Time: 09/16/17  3:39 PM  Result  Value Ref Range Status   MRSA by PCR NEGATIVE NEGATIVE Final    Comment:        The GeneXpert MRSA Assay (FDA approved for NASAL specimens only), is one component of a comprehensive MRSA colonization surveillance program. It is not intended to diagnose MRSA infection nor to guide or monitor treatment for MRSA infections. Performed at [facility], [place] [address], [place], [place] [address]   Gastrointestinal Panel by PCR , Stool     Status: None   Collection Time: 09/17/17  9:20 AM  Result Value Ref Range Status   Campylobacter species NOT DETECTED NOT DETECTED Final   Plesimonas shigelloides NOT DETECTED NOT DETECTED Final   Salmonella species NOT DETECTED NOT DETECTED Final   Yersinia enterocolitica NOT DETECTED NOT DETECTED Final   Vibrio species NOT DETECTED NOT DETECTED Final   Vibrio cholerae NOT DETECTED NOT DETECTED Final   Enteroaggregative E coli (EAEC) NOT DETECTED NOT DETECTED Final   Enteropathogenic E coli (EPEC) NOT DETECTED NOT DETECTED Final   Enterotoxigenic E coli (ETEC) NOT DETECTED NOT DETECTED Final   Shiga like toxin producing E coli (STEC) NOT DETECTED NOT DETECTED Final   Shigella/Enteroinvasive E coli (EIEC) NOT DETECTED NOT DETECTED Final   Cryptosporidium NOT DETECTED NOT DETECTED Final   Cyclospora cayetanensis NOT DETECTED NOT DETECTED Final   Entamoeba histolytica NOT DETECTED NOT DETECTED Final   Giardia lamblia NOT DETECTED NOT DETECTED Final   Adenovirus F40/41 NOT DETECTED NOT DETECTED Final   Astrovirus NOT DETECTED NOT DETECTED Final   Norovirus GI/GII NOT DETECTED NOT DETECTED Final   Rotavirus A NOT DETECTED NOT DETECTED  Final   Sapovirus (I, II, IV, and V) NOT DETECTED NOT DETECTED Final    Comment: Performed at [facility], [address] [place], [place] [address]  C difficile quick scan w PCR reflex     Status: None   Collection Time: 09/17/17  9:20 AM  Result Value Ref Range Status   C Diff antigen  NEGATIVE NEGATIVE Final   C Diff toxin NEGATIVE NEGATIVE Final   C Diff interpretation No C. difficile detected.  Final    Comment: Performed at [facility], [place] [address] [place], [place] [address]     Time coordinating discharge in minutes: 64  SIGNED:   Debbe Odea, MD  [facility] 09/19/2017, 4:29 PM Pager   If 7PM-7AM, please contact night-coverage [email or web address] Password [email or web address]

## 2017-09-19 NOTE — Progress Notes (Signed)
Reviewed discharge instructions with pt and her spouse who is at bedside. Reviewed new medication and reviewed medications to stop. Also reviewed signs and symptoms of infection and follow up appts that are scheduled. Pt and spouse verbalize understanding. Pt will finish dose of Zosyn and piv's will be removed and pt can discharge

## 2017-09-19 NOTE — Progress Notes (Signed)
Pt d/c with all personal belongings, husband at bedside. Pt escorted by RN in wheelchair off of unit.

## 2017-09-19 NOTE — Evaluation (Signed)
Physical Therapy Evaluation Patient Details Name: ITZEL MCKIBBIN MRN: [id number] DOB: 12-29-1950 Today's Date: 09/19/2017   History of Present Illness  Pt admitted with severe sepsis with septic shock  Clinical Impression  Pt admitted as above and presenting with functional mobility limitations 2* generalized weakness and mild ambulatory instability.  Pt should progress to dc home with family assist.  No HHPT at this time.  Pt states she is following up with her Dr on Monday and will request PT assist if not progressing.    Follow Up Recommendations No PT follow up    Equipment Recommendations  None recommended by PT    Recommendations for Other Services       Precautions / Restrictions Precautions Precautions: Fall Restrictions Weight Bearing Restrictions: No      Mobility  Bed Mobility Overal bed mobility: Modified Independent                Transfers Overall transfer level: Needs assistance   Transfers: Sit to/from Stand Sit to Stand: Min assist;Min guard         General transfer comment: cues for use of UEs to self assist.  Min guard assist to bring wt up  and fwd and to steady in initial standing  Ambulation/Gait Ambulation/Gait assistance: Min assist;Min guard Ambulation Distance (Feet): 300 Feet Assistive device: Rolling walker (2 wheeled);1 person hand held assist Gait Pattern/deviations: Step-through pattern;Decreased step length - right;Decreased step length - left;Shuffle Gait velocity: decr   General Gait Details: Mild general instability but no LOB following 1 week in bed.  Pt reports feeling steadier/safer with use of RW  Stairs            Wheelchair Mobility    Modified Rankin (Stroke Patients Only)       Balance Overall balance assessment: Needs assistance Sitting-balance support: No upper extremity supported;Feet supported Sitting balance-Leahy Scale: Good     Standing balance support: No upper extremity  supported Standing balance-Leahy Scale: Fair                               Pertinent Vitals/Pain Pain Assessment: No/denies pain    Home Living Family/patient expects to be discharged to:: Private residence Living Arrangements: Spouse/significant other Available Help at Discharge: Family Type of Home: House Home Access: Stairs to enter Entrance Stairs-Rails: Right Entrance Stairs-Number of Steps: 9 Home Layout: One level Home Equipment: Environmental consultant - 2 wheels;Cane - single point;Bedside commode      Prior Function Level of Independence: Independent               Hand Dominance        Extremity/Trunk Assessment   Upper Extremity Assessment Upper Extremity Assessment: Generalized weakness    Lower Extremity Assessment Lower Extremity Assessment: Generalized weakness    Cervical / Trunk Assessment Cervical / Trunk Assessment: Normal  Communication   Communication: No difficulties  Cognition Arousal/Alertness: Awake/alert Behavior During Therapy: WFL for tasks assessed/performed Overall Cognitive Status: Within Functional Limits for tasks assessed                                        General Comments      Exercises     Assessment/Plan    PT Assessment Patient needs continued PT services  PT Problem List Decreased strength;Decreased activity tolerance;Decreased balance;Decreased mobility;Decreased knowledge of use of  DME       PT Treatment Interventions DME instruction;Gait training;Stair training;Functional mobility training;Therapeutic activities;Therapeutic exercise;Patient/family education;Balance training    PT Goals (Current goals can be found in the Care Plan section)  Acute Rehab PT Goals Patient Stated Goal: Regain goal PT Goal Formulation: With patient Time For Goal Achievement: 09/26/17 Potential to Achieve Goals: Good    Frequency Min 3X/week   Barriers to discharge        Co-evaluation                AM-PAC PT "6 Clicks" Daily Activity  Outcome Measure Difficulty turning over in bed (including adjusting bedclothes, sheets and blankets)?: A Little Difficulty moving from lying on back to sitting on the side of the bed? : A Lot Difficulty sitting down on and standing up from a chair with arms (e.g., wheelchair, bedside commode, etc,.)?: A Lot Help needed moving to and from a bed to chair (including a wheelchair)?: A Little Help needed walking in hospital room?: A Little Help needed climbing 3-5 steps with a railing? : A Little 6 Click Score: 16    End of Session Equipment Utilized During Treatment: Gait belt Activity Tolerance: Patient tolerated treatment well Patient left: in bed;with call bell/phone within reach;with family/visitor present Nurse Communication: Mobility status PT Visit Diagnosis: Unsteadiness on feet (R26.81);Difficulty in walking, not elsewhere classified (R26.2)    Time: 2409-7353 PT Time Calculation (min) (ACUTE ONLY): 24 min   Charges:   PT Evaluation $PT Eval Low Complexity: 1 Low PT Treatments $Gait Training: 8-22 mins   PT G Codes:        Pg [phone]   Hurshel Bouillon 09/19/2017, 1:19 PM

## 2017-09-19 NOTE — Progress Notes (Signed)
General Surgery [facility]  Assessment & Plan: Sepsis syndrome, febrile illness  No clinical evidence of acute cholecystitis  Tolerating diet, denies abdominal pain  Abdomen non-tender, no mass  LFT's returning to normal  No role for cholecystectomy at this time.  Patient agrees.  Will plan follow up with gastroenterologist in near future. Will sign off.  Call if needed.        Earnstine Regal, MD, [facility]       Office: [phone]    Chief Complaint: Febrile illness, sepsis syndrome  Subjective: Patient denies abdominal pain.  Tolerating diet.  Objective: Vital signs in last 24 hours: Temp:  [97.8 F (36.6 C)-98.8 F (37.1 C)] 98.5 F (36.9 C) (06/12 0757) Pulse Rate:  [61-85] 79 (06/12 0649) Resp:  [11-25] 18 (06/12 0649) BP: (86-105)/(43-58) 90/48 (06/12 0649) SpO2:  [84 %-100 %] 99 % (06/12 0649) Last BM Date: 09/18/17  Intake/Output from previous day: 06/11 0701 - 06/12 0700 In: 3225.6 [P.O.:550; I.V.:1687.5; IV Piggyback:988.1] Out: 4128 [Urine:1326; Stool:1] Intake/Output this shift: No intake/output data recorded.  Physical Exam: HEENT - sclerae clear, mucous membranes moist Neck - soft  Abdomen - soft, non-tender; no mass Ext - no edema, non-tender Neuro - alert & oriented, no focal deficits  Lab Results:  Recent Labs    09/18/17 0316 09/19/17 0308  WBC 5.2 4.3  HGB 9.3* 8.8*  HCT 27.6* 25.4*  PLT 106* 143*   BMET Recent Labs    09/18/17 0316 09/19/17 0308  NA 138 141  K 3.5 2.9*  CL 114* 115*  CO2 19* 19*  GLUCOSE 110* 95  BUN 12 9  CREATININE 0.83 0.72  CALCIUM 8.0* 8.0*   PT/INR No results for input(s): LABPROT, INR in the last 72 hours. Comprehensive Metabolic Panel:    Component Value Date/Time   NA 141 09/19/2017 0308   NA 138 09/18/2017 0316   K 2.9 (L) 09/19/2017 0308   K 3.5 09/18/2017 0316   CL 115 (H) 09/19/2017 0308   CL 114 (H) 09/18/2017 0316   CO2 19  (L) 09/19/2017 0308   CO2 19 (L) 09/18/2017 0316   BUN 9 09/19/2017 0308   BUN 12 09/18/2017 0316   CREATININE 0.72 09/19/2017 0308   CREATININE 0.83 09/18/2017 0316   GLUCOSE 95 09/19/2017 0308   GLUCOSE 110 (H) 09/18/2017 0316   CALCIUM 8.0 (L) 09/19/2017 0308   CALCIUM 8.0 (L) 09/18/2017 0316   AST 56 (H) 09/19/2017 0308   AST 77 (H) 09/18/2017 0316   ALT 44 09/19/2017 0308   ALT 51 09/18/2017 0316   ALKPHOS 58 09/19/2017 0308   ALKPHOS 69 09/18/2017 0316   BILITOT 0.6 09/19/2017 0308   BILITOT 0.5 09/18/2017 0316   PROT 4.7 (L) 09/19/2017 0308   PROT 4.9 (L) 09/18/2017 0316   ALBUMIN 2.3 (L) 09/19/2017 0308   ALBUMIN 2.4 (L) 09/18/2017 0316    Studies/Results: US Abdomen Limited Ruq  Result Date: 09/17/2017 CLINICAL DATA:  Elevated LFT.  Abdominal pain EXAM: ULTRASOUND ABDOMEN LIMITED RIGHT UPPER QUADRANT COMPARISON:  MRI liver 12/13/2016.  Ultrasound abdomen 11/09/2016 FINDINGS: Gallbladder: Gallbladder distended. Negative for gallstones. Gallbladder wall mildly thickened 4.2 mm. Negative sonographic Murphy sign. Small amount of pericholecystic fluid. Previously noted small lesion in the fundus of the gallbladder on MRI not visualized by ultrasound. Common bile duct: Diameter: 2.6 mm Liver: No focal lesion identified. Within normal limits in parenchymal echogenicity. Portal  vein is patent on color Doppler imaging with normal direction of blood flow towards the liver. IMPRESSION: Distended gallbladder with gallbladder wall thickening and mild pericholecystic fluid. Negative for gallstones or gallbladder pain. Question cholecystitis symptoms. Electronically Signed   By: Franchot Gallo M.D.   On: 09/17/2017 19:02      Arie Powell M 09/19/2017  Patient ID: Katelyn Mcclain, female   DOB: 1950/06/30, 67 y.o.   MRN: [id number]

## 2017-09-20 LAB — HEPATITIS PANEL, ACUTE
HEP B C IGM: NEGATIVE
Hep A IgM: NEGATIVE
Hepatitis B Surface Ag: NEGATIVE

## 2017-09-21 LAB — CULTURE, BLOOD (ROUTINE X 2)
CULTURE: NO GROWTH
CULTURE: NO GROWTH
SPECIAL REQUESTS: ADEQUATE
Special Requests: ADEQUATE

## 2017-09-24 DIAGNOSIS — M25511 Pain in right shoulder: Secondary | ICD-10-CM | POA: Diagnosis not present

## 2017-10-01 ENCOUNTER — Other Ambulatory Visit: Payer: Self-pay

## 2017-10-01 ENCOUNTER — Emergency Department (HOSPITAL_BASED_OUTPATIENT_CLINIC_OR_DEPARTMENT_OTHER)
Admission: EM | Admit: 2017-10-01 | Discharge: 2017-10-01 | Disposition: A | Discharge status: Home / Self Care | Payer: Medicare Other | Attending: Emergency Medicine | Admitting: Emergency Medicine

## 2017-10-01 ENCOUNTER — Encounter (HOSPITAL_BASED_OUTPATIENT_CLINIC_OR_DEPARTMENT_OTHER): Payer: Self-pay

## 2017-10-01 DIAGNOSIS — R531 Weakness: Secondary | ICD-10-CM | POA: Diagnosis not present

## 2017-10-01 DIAGNOSIS — Z87891 Personal history of nicotine dependence: Secondary | ICD-10-CM | POA: Diagnosis not present

## 2017-10-01 DIAGNOSIS — J45909 Unspecified asthma, uncomplicated: Secondary | ICD-10-CM | POA: Insufficient documentation

## 2017-10-01 DIAGNOSIS — Z79899 Other long term (current) drug therapy: Secondary | ICD-10-CM | POA: Insufficient documentation

## 2017-10-01 LAB — COMPREHENSIVE METABOLIC PANEL
ALBUMIN: 3.5 g/dL (ref 3.5–5.0)
ALK PHOS: 74 U/L (ref 38–126)
ALT: 20 U/L (ref 14–54)
ANION GAP: 8 (ref 5–15)
AST: 25 U/L (ref 15–41)
BUN: 14 mg/dL (ref 6–20)
CHLORIDE: 106 mmol/L (ref 101–111)
CO2: 23 mmol/L (ref 22–32)
Calcium: 9.1 mg/dL (ref 8.9–10.3)
Creatinine, Ser: 0.97 mg/dL (ref 0.44–1.00)
GFR calc non Af Amer: 60 mL/min — ABNORMAL LOW (ref >60)
GLUCOSE: 105 mg/dL — AB (ref 65–99)
Potassium: 4 mmol/L (ref 3.5–5.1)
Sodium: 137 mmol/L (ref 135–145)
Total Bilirubin: 0.5 mg/dL (ref 0.3–1.2)
Total Protein: 7.1 g/dL (ref 6.5–8.1)

## 2017-10-01 LAB — CBC
HCT: 36.7 % (ref 36.0–46.0)
Hemoglobin: 12.2 g/dL (ref 12.0–15.0)
MCH: 31.8 pg (ref 26.0–34.0)
MCHC: 33.2 g/dL (ref 30.0–36.0)
MCV: 95.6 fL (ref 78.0–100.0)
PLATELETS: 339 10*3/uL (ref 150–400)
RBC: 3.84 MIL/uL — AB (ref 3.87–5.11)
RDW: 15 % (ref 11.5–15.5)
WBC: 7.7 10*3/uL (ref 4.0–10.5)

## 2017-10-01 LAB — URINALYSIS, ROUTINE W REFLEX MICROSCOPIC
Bilirubin Urine: NEGATIVE
GLUCOSE, UA: NEGATIVE mg/dL
Hgb urine dipstick: NEGATIVE
Ketones, ur: NEGATIVE mg/dL
LEUKOCYTES UA: NEGATIVE
NITRITE: NEGATIVE
PH: 6 (ref 5.0–8.0)
Protein, ur: NEGATIVE mg/dL
SPECIFIC GRAVITY, URINE: 1.015 (ref 1.005–1.030)

## 2017-10-01 LAB — I-STAT CG4 LACTIC ACID, ED: LACTIC ACID, VENOUS: 1.86 mmol/L (ref 0.5–1.9)

## 2017-10-01 LAB — TSH: TSH: 1.138 u[IU]/mL (ref 0.350–4.500)

## 2017-10-01 MED ORDER — SODIUM CHLORIDE 0.9 % IV SOLN: INTRAVENOUS | Status: DC

## 2017-10-01 MED ORDER — SODIUM CHLORIDE 0.9 % IV BOLUS
1000.0000 mL | Freq: Once | INTRAVENOUS | Status: AC
  Administered 2017-10-01: 1000 mL via INTRAVENOUS

## 2017-10-01 NOTE — ED Notes (Signed)
Pt tolerated popsicle well.

## 2017-10-01 NOTE — ED Triage Notes (Signed)
C/o fever,fatigue, low BP x 1 week-recent admn for sepsis-states feels "about the same"-taking abx for yeast infection and tick bite-pt NAD-steady gait

## 2017-10-01 NOTE — ED Notes (Signed)
Pt voiced understanding of d/c instructions. Pt has no concerns at this time.

## 2017-10-01 NOTE — ED Notes (Addendum)
Pt given ginger ale and crackers. Pt states she's unable to eat crackers- pop sickle given.

## 2017-10-01 NOTE — Discharge Instructions (Signed)
It was our pleasure to provide your ER care today - we hope that you feel better.  Rest. Drink adequate fluids. Consider supplementing nutrition with shake such as Boost, Ensure or other nutritious shake.   Follow up with primary care doctor in the coming week for recheck.  Return to ER if worse, new symptoms, high fevers, difficulty breathing, other concern.

## 2017-10-01 NOTE — ED Provider Notes (Signed)
[facility] Provider Note   CSN: [id number] Arrival date & time: 10/01/17  1213     History   Chief Complaint Chief Complaint  Patient presents with  . Weakness    HPI Katelyn Mcclain is a 67 y.o. female.  Patient c/o generalized weakness, fatigue, ever since recent d/c from hospital w febrile illness. Pt denies fever. Saw pcp w same last week - was started on doxycycline as reported prior tick bites (of note, rmsf and lyme labs neg for acute tick infection during recent admission). Pt is eating and drinking. Denies diarrhea. No abd pain. Says low grade fever at home although says temps in 98-99 range. Denies cough or uri symptoms. No chest pain or discomfort. No sob. No headache. No neck pain or stiffness. No dysuria or gu c/o. No rash/skin lesions.   The history is provided by the patient.  Fever   Pertinent negatives include no chest pain, no diarrhea, no vomiting, no headaches, no sore throat and no cough.    Past Medical History:  Diagnosis Date  . Abscessed tooth   . Allergic rhinitis   . Asthma   . Asthma    inhaler prn  . Celiac disease   . Depression with anxiety   . Dysmenorrhea   . Gastroparesis   . GERD (gastroesophageal reflux disease)   . Hx of low back pain   . IBS (irritable bowel syndrome)   . Migraine   . Migraine without aura, with intractable migraine, so stated, without mention of status migrainosus 05/20/2013  . Osteopenia     Patient Active Problem List   Diagnosis Date Noted  . Severe sepsis with septic shock ([place]) 09/16/2017  . Celiac disease 09/16/2017  . Bacterial overgrowth syndrome 09/16/2017  . Tick bites 09/16/2017  . AKI (acute kidney injury) ([place]) 09/16/2017  . Hyponatremia 09/16/2017  . Thrombocytopenia ([place]) 09/16/2017  . Chest pain 06/15/2013  . Intractable migraine without aura 05/20/2013    Past Surgical History:  Procedure Laterality Date  . ABDOMINAL SURGERY    . BREAST BIOPSY Right    x2  . BREAST CYST ASPIRATION  1999  . COLONOSCOPY    . FOOT SURGERY Left    Bunionectomy  . MASS EXCISION  08/08/2011   Procedure: MINOR EXCISION OF MASS;  Surgeon: Cammie Sickle., MD;  Location: [facility];  Service: Orthopedics;  Laterality: Left;  left ring excision cyst proximal phalangeal joint  . NISSEN FUNDOPLICATION    . ROTATOR CUFF REPAIR Right 05/2015  . TONSILLECTOMY       OB History   None      Home Medications    Prior to Admission medications   Medication Sig Start Date End Date Taking? Authorizing Provider  acetaminophen (TYLENOL) 500 MG tablet Take 500 mg by mouth every 6 (six) hours as needed (pain).    [provider]  albuterol (PROVENTIL HFA;VENTOLIN HFA) 108 (90 BASE) MCG/ACT inhaler Inhale 1 puff into the lungs every 6 (six) hours as needed for wheezing or shortness of breath (seasonal allergies).     [provider]  Calcium-Magnesium-Vitamin D (CALCIUM 1200+D3 PO) Take 1,200 mg by mouth daily.    [provider]  cycloSPORINE (RESTASIS) 0.05 % ophthalmic emulsion Place 1 drop into both eyes at bedtime.     [provider]  dicyclomine (BENTYL) 20 MG tablet Take 20 mg by mouth 2 (two) times daily as needed for spasms (stomach pain).  06/26/16  [provider]  diphenoxylate-atropine (LOMOTIL) 2.5-0.025 MG tablet Take 1 tablet by mouth 2 (two) times daily as needed for diarrhea or loose stools (stop taking in 3 days if it does not work). 09/19/17 09/19/18  Debbe Odea, MD  docusate sodium (COLACE) 100 MG capsule Take 100 mg by mouth 2 (two) times daily.    [provider]  Estrogens, Conjugated (PREMARIN VA) Place 1 application vaginally once a week.     [provider]  Eszopiclone (ESZOPICLONE) 3 MG TABS Take 3 mg by mouth at bedtime as needed (sleep). Take immediately before bedtime     [provider]  Garlic (GARLIQUE PO) Take 1 capsule by mouth daily.     [provider]  Miconazole Nitrate (MONISTAT 7 VA) Place 1 application vaginally at bedtime as needed (yeast infection).    [provider]  montelukast (SINGULAIR) 10 MG tablet Take 10 mg by mouth at bedtime.  11/03/13   [provider]  omeprazole (PRILOSEC) 40 MG capsule Take 40 mg by mouth daily. 10/10/16   [provider]  ondansetron (ZOFRAN) 4 MG tablet TAKE 1 TABLET BY MOUTH EVERY 6 HOURS AS NEEDED FOR NAUSEA - IF PHERERGAN NOT WORKING 07/30/17   [provider]  OVER THE COUNTER MEDICATION Take 500 mg by mouth daily. Vitamin B5    [provider]  OVER THE COUNTER MEDICATION Take 500 mg by mouth daily. Berberine    [provider]  Probiotic Product (PROBIOTIC DAILY PO) Take 1 tablet by mouth 2 (two) times daily.     [provider]  promethazine (PHENERGAN) 25 MG tablet Take 1 tablet (25 mg total) by mouth every 6 (six) hours as needed for nausea or vomiting. 11/30/15   Lawyer, Harrell Gave, PA-C  pyridoxine (B-6) 200 MG tablet Take 200 mg by mouth daily.    [provider]  rizatriptan (MAXALT-MLT) 10 MG disintegrating tablet Take 1 tablet PO at the onset of migraine and May repeat in 2 hours if needed. Not to exceed 2 tabs/24 hours. 01/11/17   Kathrynn Ducking, MD  vortioxetine HBr (TRINTELLIX) 20 MG TABS Take 20 mg by mouth at bedtime.    [provider]  zonisamide (ZONEGRAN) 100 MG capsule Take 1 capsule (100 mg total) by mouth 2 (two) times daily. 07/23/17   Ward Givens, NP  zonisamide (ZONEGRAN) 25 MG capsule TAKE 2 CAPSULE EVERY EVENING WITH 100 MG CAPSULE TO MAKE 150 MG NIGHTLY DOSE. 07/23/17   Ward Givens, NP    Family History Family History  Problem Relation Age of Onset  . Stroke Father   . Dementia Father   . Atrial fibrillation Father   . Cancer Father   . Hypertension Mother   . Scoliosis Mother   . Heart disease Sister        Congenital heart disease  . Migraines Neg Hx     Social  History Social History   Tobacco Use  . Smoking status: Former Research scientist (life sciences)  . Smokeless tobacco: Never Used  . Tobacco comment: IN college 1 year  Substance Use Topics  . Alcohol use: Yes    Alcohol/week: 0.0 oz    Comment: wine occasionally  . Drug use: No     Allergies   Adhesive [tape]; Morphine and related; Clonazepam; Linaclotide; and Other   Review of Systems Review of Systems  Constitutional: Negative for chills and fever.  HENT: Negative for sore throat.   Eyes: Negative for pain, redness and visual  disturbance.  Respiratory: Negative for cough and shortness of breath.   Cardiovascular: Negative for chest pain.  Gastrointestinal: Negative for abdominal pain, diarrhea and vomiting.  Genitourinary: Negative for dysuria and flank pain.  Musculoskeletal: Negative for neck pain and neck stiffness.  Skin: Negative for rash.  Neurological: Negative for numbness and headaches.  Hematological: Does not bruise/bleed easily.  Psychiatric/Behavioral: Negative for confusion.     Physical Exam Updated Vital Signs BP 114/75 (BP Location: Left Arm)   Pulse 95   Temp 98.2 F (36.8 C) (Oral)   Resp 16   Ht 1.791 m (5' 10.5")   Wt 61.8 kg (136 lb 4.8 oz)   SpO2 100%   BMI 19.28 kg/m   Physical Exam  Constitutional: She appears well-developed and well-nourished. No distress.  HENT:  Mouth/Throat: Oropharynx is clear and moist.  Eyes: Pupils are equal, round, and reactive to light. Conjunctivae are normal. No scleral icterus.  Neck: Neck supple. No tracheal deviation present. No thyromegaly present.  Cardiovascular: Normal rate, regular rhythm, normal heart sounds and intact distal pulses. Exam reveals no gallop and no friction rub.  No murmur heard. Pulmonary/Chest: Effort normal and breath sounds normal. No respiratory distress.  Abdominal: Soft. Normal appearance and bowel sounds are normal. She exhibits no distension and no mass. There is no tenderness. There is no rebound  and no guarding. No hernia.  Genitourinary:  Genitourinary Comments: No cva tenderness  Musculoskeletal: She exhibits no edema.  Neurological: She is alert.  Speech normal. Steady gait.   Skin: Skin is warm and dry. No rash noted. She is not diaphoretic.  Psychiatric: She has a normal mood and affect.  Nursing note and vitals reviewed.    ED Treatments / Results  Labs (all labs ordered are listed, but only abnormal results are displayed) Results for orders placed or performed during the hospital encounter of 10/01/17  CBC  Result Value Ref Range   WBC 7.7 4.0 - 10.5 K/uL   RBC 3.84 (L) 3.87 - 5.11 MIL/uL   Hemoglobin 12.2 12.0 - 15.0 g/dL   HCT 36.7 36.0 - 46.0 %   MCV 95.6 78.0 - 100.0 fL   MCH 31.8 26.0 - 34.0 pg   MCHC 33.2 30.0 - 36.0 g/dL   RDW 15.0 11.5 - 15.5 %   Platelets 339 150 - 400 K/uL  Comprehensive metabolic panel  Result Value Ref Range   Sodium 137 135 - 145 mmol/L   Potassium 4.0 3.5 - 5.1 mmol/L   Chloride 106 101 - 111 mmol/L   CO2 23 22 - 32 mmol/L   Glucose, Bld 105 (H) 65 - 99 mg/dL   BUN 14 6 - 20 mg/dL   Creatinine, Ser 0.97 0.44 - 1.00 mg/dL   Calcium 9.1 8.9 - 10.3 mg/dL   Total Protein 7.1 6.5 - 8.1 g/dL   Albumin 3.5 3.5 - 5.0 g/dL   AST 25 15 - 41 U/L   ALT 20 14 - 54 U/L   Alkaline Phosphatase 74 38 - 126 U/L   Total Bilirubin 0.5 0.3 - 1.2 mg/dL   GFR calc non Af Amer 60 (L) >60 mL/min   GFR calc Af Amer >60 >60 mL/min   Anion gap 8 5 - 15  Urinalysis, Routine w reflex microscopic  Result Value Ref Range   Color, Urine YELLOW YELLOW   APPearance CLEAR CLEAR   Specific Gravity, Urine 1.015 1.005 - 1.030   pH 6.0 5.0 - 8.0   Glucose, UA  NEGATIVE NEGATIVE mg/dL   Hgb urine dipstick NEGATIVE NEGATIVE   Bilirubin Urine NEGATIVE NEGATIVE   Ketones, ur NEGATIVE NEGATIVE mg/dL   Protein, ur NEGATIVE NEGATIVE mg/dL   Nitrite NEGATIVE NEGATIVE   Leukocytes, UA NEGATIVE NEGATIVE  I-Stat CG4 Lactic Acid, ED  Result Value Ref Range    Lactic Acid, Venous 1.86 0.5 - 1.9 mmol/L   Dg Chest 2 View  Result Date: 09/16/2017 CLINICAL DATA:  Cough and fever EXAM: CHEST - 2 VIEW COMPARISON:  December 07, 2015 FINDINGS: There is no edema or consolidation. The heart size and pulmonary vascularity are normal. No adenopathy. No evident bone lesions. IMPRESSION: No edema or consolidation. Electronically Signed   By: Lowella Grip III M.D.   On: 09/16/2017 10:57   US Abdomen Limited Ruq  Result Date: 09/17/2017 CLINICAL DATA:  Elevated LFT.  Abdominal pain EXAM: ULTRASOUND ABDOMEN LIMITED RIGHT UPPER QUADRANT COMPARISON:  MRI liver 12/13/2016.  Ultrasound abdomen 11/09/2016 FINDINGS: Gallbladder: Gallbladder distended. Negative for gallstones. Gallbladder wall mildly thickened 4.2 mm. Negative sonographic Murphy sign. Small amount of pericholecystic fluid. Previously noted small lesion in the fundus of the gallbladder on MRI not visualized by ultrasound. Common bile duct: Diameter: 2.6 mm Liver: No focal lesion identified. Within normal limits in parenchymal echogenicity. Portal vein is patent on color Doppler imaging with normal direction of blood flow towards the liver. IMPRESSION: Distended gallbladder with gallbladder wall thickening and mild pericholecystic fluid. Negative for gallstones or gallbladder pain. Question cholecystitis symptoms. Electronically Signed   By: Franchot Gallo M.D.   On: 09/17/2017 19:02    EKG None  Radiology No results found.  Procedures Procedures (including critical care time)  Medications Ordered in ED Medications  0.9 %  sodium chloride infusion (has no administration in time range)  sodium chloride 0.9 % bolus 1,000 mL (has no administration in time range)     Initial Impression / Assessment and Plan / ED Course  I have reviewed the triage vital signs and the nursing notes.  Pertinent labs & imaging results that were available during my care of the patient were reviewed by me and considered in my  medical decision making (see chart for details).  Iv ns bolus. Labs.   Reviewed nursing notes and prior charts for additional history.   Labs reviewed - lytes normal, lactate normal, ua neg.  Po fluids.   Recheck, normal vital signs, no pain, no fevers, abd soft nt, tolerating po.  Pt appears stable for d/c.   rec close outpt pcp f/u.     Final Clinical Impressions(s) / ED Diagnoses   Final diagnoses:  None    ED Discharge Orders    None       Lajean Saver, MD 10/01/17 1455

## 2017-10-15 DIAGNOSIS — J45909 Unspecified asthma, uncomplicated: Secondary | ICD-10-CM | POA: Diagnosis not present

## 2017-10-15 DIAGNOSIS — Z09 Encounter for follow-up examination after completed treatment for conditions other than malignant neoplasm: Secondary | ICD-10-CM | POA: Diagnosis not present

## 2017-10-15 DIAGNOSIS — K219 Gastro-esophageal reflux disease without esophagitis: Secondary | ICD-10-CM | POA: Diagnosis not present

## 2017-10-31 DIAGNOSIS — Z09 Encounter for follow-up examination after completed treatment for conditions other than malignant neoplasm: Secondary | ICD-10-CM | POA: Diagnosis not present

## 2017-10-31 DIAGNOSIS — E860 Dehydration: Secondary | ICD-10-CM | POA: Diagnosis not present

## 2017-10-31 DIAGNOSIS — R5383 Other fatigue: Secondary | ICD-10-CM | POA: Diagnosis not present

## 2017-10-31 DIAGNOSIS — W57XXXD Bitten or stung by nonvenomous insect and other nonvenomous arthropods, subsequent encounter: Secondary | ICD-10-CM | POA: Diagnosis not present

## 2017-10-31 DIAGNOSIS — K6389 Other specified diseases of intestine: Secondary | ICD-10-CM | POA: Diagnosis not present

## 2017-10-31 DIAGNOSIS — K900 Celiac disease: Secondary | ICD-10-CM | POA: Diagnosis not present

## 2017-11-27 DIAGNOSIS — R102 Pelvic and perineal pain: Secondary | ICD-10-CM | POA: Diagnosis not present

## 2017-11-27 DIAGNOSIS — N393 Stress incontinence (female) (male): Secondary | ICD-10-CM | POA: Diagnosis not present

## 2017-12-18 ENCOUNTER — Telehealth: Payer: Self-pay | Admitting: Neurology

## 2017-12-18 DIAGNOSIS — M79604 Pain in right leg: Secondary | ICD-10-CM | POA: Diagnosis not present

## 2017-12-18 DIAGNOSIS — M79605 Pain in left leg: Secondary | ICD-10-CM | POA: Diagnosis not present

## 2017-12-18 NOTE — Telephone Encounter (Signed)
Pt's husband said she fell [place]. She fell on pavement on her side, she didn't think she fell on her knees. Over the past 2-3 weeks she's had burning in bil knees. Pt uses mineral oil which does help. There is no swelling. He said she was seen by PCP and advised to f/u with Dr Jannifer Franklin. An appt has been scheduled for 11/6 and pt has been added to wait list.  FYI

## 2017-12-18 NOTE — Telephone Encounter (Signed)
Okay for the patient be seen sometime in the next several weeks.

## 2017-12-18 NOTE — Telephone Encounter (Signed)
Dr. Jannifer Franklin- appropriate for pt to see you for this?

## 2017-12-19 NOTE — Telephone Encounter (Signed)
Called and scheduled earlier f/u with Dr. Jannifer Franklin on 12/25/17 at 53am, check in 7am. Pt verbalized understanding and appreciation. Cx prior appt made on 02/13/18 since she is coming in sooner. Also cx appt w/ Megan in October since she'll be seen sooner.

## 2017-12-25 ENCOUNTER — Ambulatory Visit (INDEPENDENT_AMBULATORY_CARE_PROVIDER_SITE_OTHER): Payer: Medicare Other | Admitting: Neurology

## 2017-12-25 ENCOUNTER — Encounter: Payer: Self-pay | Admitting: Neurology

## 2017-12-25 VITALS — BP 117/70 | HR 64 | Wt 147.5 lb

## 2017-12-25 DIAGNOSIS — E538 Deficiency of other specified B group vitamins: Secondary | ICD-10-CM

## 2017-12-25 DIAGNOSIS — G43019 Migraine without aura, intractable, without status migrainosus: Secondary | ICD-10-CM

## 2017-12-25 DIAGNOSIS — R202 Paresthesia of skin: Secondary | ICD-10-CM | POA: Diagnosis not present

## 2017-12-25 MED ORDER — DEXAMETHASONE 2 MG PO TABS: ORAL_TABLET | ORAL | 0 refills | Status: DC

## 2017-12-25 NOTE — Progress Notes (Signed)
Reason for visit: Paresthesias in the legs  Katelyn Mcclain is a 67 y.o. female  History of present illness:  Katelyn Mcclain is a 67 year old right-handed white female with a history of migraine headaches and a history of celiac disease.  The celiac disease has been well controlled, the patient mainly has difficulty with constipation at this point.  She was in the hospital on 16 September 2017 with sepsis of unknown source.  The patient seemed to recover from this, she was seen in the emergency room on 01 October 2017 with generalized weakness.  The patient has been able to maintain her weight, she has tried to get back into regular physical activity.  In July 2019 she was hiking and had a fall, she injured her left hip with significant swelling, this remains sore but is gradually improving.  About 1 month ago, the patient began having burning sensations around the knees bilaterally without any actual swelling in the knees or joint pain.  The burning sensations beginning just above the knees and go down below the knees, occasionally in the pretibial area to the top of the foot.  The episodes are more noticeable when she is inactive, it is keeping her awake at night.  The patient denies any severe weakness, she does feel slightly weak in the legs, she has to think more about how she is walking.  The walking issue began after the sepsis episode.  The patient has a history of migraine headaches, she takes Zonegran for this, over the last week she has had daily headaches and she has had difficulty getting rid of the headache.  The patient denies any changes in bowel or bladder function, she is on Myrbetriq for urinary urgency, she believes that the burning sensation in the knees began shortly after she started Myrbetriq.  The patient denies any neck pain or pain down the arms, she denies low back pain.  She denies any changes in strength or sensation of the arms.  The patient believes that the burning sensations around  the knees has slightly improved as time has gone on.  The patient reports that in the spring 2019 she had several tick bites, a Lyme panel and [place] spotted fever panel done in early June were negative.  Past Medical History:  Diagnosis Date  . Abscessed tooth   . Allergic rhinitis   . Asthma   . Asthma    inhaler prn  . Celiac disease   . Depression with anxiety   . Dysmenorrhea   . Gastroparesis   . GERD (gastroesophageal reflux disease)   . Hx of low back pain   . IBS (irritable bowel syndrome)   . Migraine   . Migraine without aura, with intractable migraine, so stated, without mention of status migrainosus 05/20/2013  . Osteopenia     Past Surgical History:  Procedure Laterality Date  . ABDOMINAL SURGERY    . BREAST BIOPSY Right    x2  . BREAST CYST ASPIRATION  1999  . COLONOSCOPY    . FOOT SURGERY Left    Bunionectomy  . MASS EXCISION  08/08/2011   Procedure: MINOR EXCISION OF MASS;  Surgeon: Cammie Sickle., MD;  Location: [place];  Service: Orthopedics;  Laterality: Left;  left ring excision cyst proximal phalangeal joint  . NISSEN FUNDOPLICATION    . ROTATOR CUFF REPAIR Right 05/2015  . TONSILLECTOMY      Family History  Problem Relation Age of Onset  .  Stroke Father   . Dementia Father   . Atrial fibrillation Father   . Cancer Father   . Hypertension Mother   . Scoliosis Mother   . Heart disease Sister        Congenital heart disease  . Migraines Neg Hx     Social history:  reports that she has quit smoking. She has never used smokeless tobacco. She reports that she drinks alcohol. She reports that she does not use drugs.  Medications:  Prior to Admission medications   Medication Sig Start Date End Date Taking? Authorizing Provider  acetaminophen (TYLENOL) 500 MG tablet Take 500 mg by mouth every 6 (six) hours as needed (pain).   Yes [provider]  albuterol (PROVENTIL HFA;VENTOLIN HFA) 108 (90 BASE) MCG/ACT  inhaler Inhale 1 puff into the lungs every 6 (six) hours as needed for wheezing or shortness of breath (seasonal allergies).    Yes [provider]  Calcium-Magnesium-Vitamin D (CALCIUM 1200+D3 PO) Take 1,200 mg by mouth daily.   Yes [provider]  cycloSPORINE (RESTASIS) 0.05 % ophthalmic emulsion Place 1 drop into both eyes at bedtime.    Yes [provider]  docusate sodium (COLACE) 100 MG capsule Take 100 mg by mouth 2 (two) times daily.   Yes [provider]  Eszopiclone (ESZOPICLONE) 3 MG TABS Take 1.5 mg by mouth at bedtime as needed (sleep). Take immediately before bedtime    Yes [provider]  Garlic (GARLIQUE PO) Take 1 capsule by mouth daily.    Yes [provider]  montelukast (SINGULAIR) 10 MG tablet Take 10 mg by mouth at bedtime.  11/03/13  Yes [provider]  omeprazole (PRILOSEC) 40 MG capsule Take 40 mg by mouth daily. 10/10/16  Yes [provider]  ondansetron (ZOFRAN) 4 MG tablet TAKE 1 TABLET BY MOUTH EVERY 6 HOURS AS NEEDED FOR NAUSEA - IF PHERERGAN NOT WORKING 07/30/17  Yes [provider]  OVER THE COUNTER MEDICATION Take 500 mg by mouth daily. Vitamin B5   Yes [provider]  Probiotic Product (PROBIOTIC DAILY PO) Take 1 tablet by mouth 2 (two) times daily.    Yes [provider]  promethazine (PHENERGAN) 25 MG tablet Take 1 tablet (25 mg total) by mouth every 6 (six) hours as needed for nausea or vomiting. 11/30/15  Yes Lawyer, Harrell Gave, PA-C  pyridoxine (B-6) 200 MG tablet Take 200 mg by mouth daily.   Yes [provider]  rizatriptan (MAXALT-MLT) 10 MG disintegrating tablet Take 1 tablet PO at the onset of migraine and May repeat in 2 hours if needed. Not to exceed 2 tabs/24 hours. 01/11/17  Yes Kathrynn Ducking, MD  vortioxetine HBr (TRINTELLIX) 20 MG TABS Take 20 mg by mouth at bedtime.   Yes [provider]  zonisamide (ZONEGRAN) 100 MG capsule Take 1  capsule (100 mg total) by mouth 2 (two) times daily. 07/23/17  Yes Ward Givens, NP  zonisamide (ZONEGRAN) 25 MG capsule TAKE 2 CAPSULE EVERY EVENING WITH 100 MG CAPSULE TO MAKE 150 MG NIGHTLY DOSE. 07/23/17  Yes Ward Givens, NP  dexamethasone (DECADRON) 2 MG tablet Take 3 tablets the first day, 2 the second and 1 the third day 12/25/17   Kathrynn Ducking, MD      Allergies  Allergen Reactions  . Adhesive [Tape] Other (See Comments)    Tear's skin off   . Morphine And Related Other (See Comments)    Sensitive to narcotics, worsening migraines.  Marland Kitchen  Clonazepam Other (See Comments)    Caused anxiety  . Linaclotide Other (See Comments)    Caused spastic bladder (reaction to Linzess)  . Other     Pt does not use artificial sweeteners     ROS:  Out of a complete 14 system review of symptoms, the patient complains only of the following symptoms, and all other reviewed systems are negative.  Light sensitivity Environmental allergies Memory loss, headache Insomnia  Blood pressure 117/70, pulse 64, weight 147 lb 8 oz (66.9 kg), SpO2 98 %.  Physical Exam  General: The patient is alert and cooperative at the time of the examination.  Eyes: Pupils are equal, round, and reactive to light. Discs are flat bilaterally.  Neck: The neck is supple, no carotid bruits are noted.  Respiratory: The respiratory examination is clear.  Cardiovascular: The cardiovascular examination reveals a regular rate and rhythm, no obvious murmurs or rubs are noted.  Skin: Extremities are without significant edema.  Neurologic Exam  Mental status: The patient is alert and oriented x 3 at the time of the examination. The patient has apparent normal recent and remote memory, with an apparently normal attention span and concentration ability.  Cranial nerves: Facial symmetry is present. There is good sensation of the face to pinprick and soft touch bilaterally. The strength of the facial muscles and the  muscles to head turning and shoulder shrug are normal bilaterally. Speech is well enunciated, no aphasia or dysarthria is noted. Extraocular movements are full. Visual fields are full. The tongue is midline, and the patient has symmetric elevation of the soft palate. No obvious hearing deficits are noted.  Motor: The motor testing reveals 5 over 5 strength of all 4 extremities. Good symmetric motor tone is noted throughout.  Sensory: Sensory testing is intact to pinprick, soft touch and vibration sensation on all 4 extremities. No evidence of extinction is noted.  Coordination: Cerebellar testing reveals good finger-nose-finger and heel-to-shin bilaterally.  Gait and station: Gait is normal. Tandem gait is normal. Romberg is negative. No drift is seen.  The patient is able to walk on the heels and the toes bilaterally.  Reflexes: Deep tendon reflexes are symmetric and normal bilaterally, with exception that the right ankle jerk reflex is absent. Toes are downgoing bilaterally.   Assessment/Plan:  1.  History of migraine headache  2.  New onset paresthesias, bilateral lower extremities  The patient does not appear to have a stocking pattern pinprick sensory deficit that would suggest a neuropathy problem.  She has had sensory alterations around the knees, the etiology is not clear.  The patient will have blood work done today, nerve conduction studies will be done on both legs and EMG on the right leg will be done.  If this study is unrevealing, we may consider an evaluation to exclude a central nervous system process.  The patient will be given a short course of Decadron for her headache.  We may consider low-dose gabapentin in the evening hours if the discomfort in the legs persists.  Jill Alexanders MD 12/25/2017 7:58 AM  [facility] [address] [place], [place] [address]  Phone [phone] Fax [phone]

## 2017-12-27 ENCOUNTER — Telehealth: Payer: Self-pay | Admitting: Neurology

## 2017-12-27 LAB — VITAMIN B12: VITAMIN B 12: 597 pg/mL (ref 232–1245)

## 2017-12-27 LAB — ENA+DNA/DS+SJORGEN'S
ENA RNP Ab: >8 AI — ABNORMAL HIGH (ref 0.0–0.9)
ENA SSB (LA) Ab: <0.2 AI (ref 0.0–0.9)
dsDNA Ab: <1 IU/mL (ref 0–9)

## 2017-12-27 LAB — RHEUMATOID FACTOR: Rhuematoid fact SerPl-aCnc: <10 IU/mL (ref 0.0–13.9)

## 2017-12-27 LAB — SEDIMENTATION RATE: SED RATE: 2 mm/h (ref 0–40)

## 2017-12-27 LAB — B. BURGDORFI ANTIBODIES: Lyme IgG/IgM Ab: <0.91 {ISR} (ref 0.00–0.90)

## 2017-12-27 LAB — COPPER, SERUM: COPPER: 105 ug/dL (ref 72–166)

## 2017-12-27 LAB — ANA W/REFLEX: Anti Nuclear Antibody(ANA): POSITIVE — AB

## 2017-12-27 NOTE — Telephone Encounter (Signed)
I called the patient, I talk with husband.  Blood work is unremarkable with exception of positive ANA, the antibody panel showed a single elevation of the RNP antibody, otherwise unremarkable.  The clinical significance of this is not clear, the rest of the blood work including the sedimentation rate was normal.  The patient will have EMG and nerve conduction study evaluation.

## 2017-12-29 ENCOUNTER — Telehealth: Payer: Self-pay | Admitting: Neurology

## 2017-12-29 MED ORDER — DEXAMETHASONE 2 MG PO TABS: ORAL_TABLET | ORAL | 0 refills | Status: DC

## 2017-12-29 NOTE — Telephone Encounter (Signed)
I called the patient.  She continues to have daily headaches, the initial Decadron 3-day course did help, I will extend it to a 6-day course.  The patient does not wish to come in for a Depacon injection, she indicates that her veins are not in good shape following her episode of sepsis.

## 2018-01-04 DIAGNOSIS — N393 Stress incontinence (female) (male): Secondary | ICD-10-CM | POA: Diagnosis not present

## 2018-01-04 DIAGNOSIS — N3281 Overactive bladder: Secondary | ICD-10-CM | POA: Diagnosis not present

## 2018-01-21 ENCOUNTER — Encounter: Payer: Self-pay | Admitting: Neurology

## 2018-01-21 ENCOUNTER — Telehealth: Payer: Self-pay | Admitting: Neurology

## 2018-01-21 ENCOUNTER — Ambulatory Visit (INDEPENDENT_AMBULATORY_CARE_PROVIDER_SITE_OTHER): Payer: Medicare Other | Admitting: Neurology

## 2018-01-21 DIAGNOSIS — G43019 Migraine without aura, intractable, without status migrainosus: Secondary | ICD-10-CM

## 2018-01-21 DIAGNOSIS — R202 Paresthesia of skin: Secondary | ICD-10-CM | POA: Diagnosis not present

## 2018-01-21 NOTE — Progress Notes (Signed)
Please refer to EMG and nerve conduction procedure note.  

## 2018-01-21 NOTE — Progress Notes (Addendum)
Nerve conduction studies done today suggest an early peripheral neuropathy, this however does not explain the burning dysesthesias up in the mid thigh level for the patient.  For this reason, MRI of the lumbosacral spine will be done, the patient does report some low back pain on occasion.     [place]    Nerve / Sites Muscle Latency Ref. Amplitude Ref. Rel Amp Segments Distance Velocity Ref. Area    ms ms mV mV %  cm m/s m/s mVms  L Peroneal - EDB     Ankle EDB 5.3 ?6.5 4.3 ?2.0 100 Ankle - EDB 9   15.3     Fib head EDB 12.3  3.9  92.4 Fib head - Ankle 33 47 ?44 14.6     Pop fossa EDB 14.6  3.8  95.4 Pop fossa - Fib head 10 44 ?44 14.4         Pop fossa - Ankle      R Peroneal - EDB     Ankle EDB 4.9 ?6.5 4.7 ?2.0 100 Ankle - EDB 9   19.2     Fib head EDB 12.3  4.4  93.2 Fib head - Ankle 33 44 ?44 19.0     Pop fossa EDB 15.1  4.5  101 Pop fossa - Fib head 12 44 ?44 19.5         Pop fossa - Ankle      L Tibial - AH     Ankle AH 4.6 ?5.8 12.5 ?4.0 100 Ankle - AH 9   32.7     Pop fossa AH 14.6  9.4  75.5 Pop fossa - Ankle 42 42 ?41 26.5           SNC    Nerve / Sites Rec. Site Peak Lat Ref.  Amp Ref. Segments Distance    ms ms V V  cm  L Sural - Ankle (Calf)     Calf Ankle 4.5 ?4.4 3 ?6 Calf - Ankle 14  R Sural - Ankle (Calf)     Calf Ankle 4.3 ?4.4 4 ?6 Calf - Ankle 14  L Superficial peroneal - Ankle     Lat leg Ankle 4.3 ?4.4 2 ?6 Lat leg - Ankle 14  R Superficial peroneal - Ankle     Lat leg Ankle 4.5 ?4.4 3 ?6 Lat leg - Ankle 14                 F  Wave    Nerve F Lat Ref.   ms ms  L Tibial - AH 51.8 ?56.0  R Tibial - AH 51.7 ?56.0

## 2018-01-21 NOTE — Telephone Encounter (Signed)
Medicare/[employer] order sent to [facility]. No auth order they will reach out to the pt to schedule.

## 2018-01-21 NOTE — Procedures (Signed)
     HISTORY:  Katelyn Mcclain is a 67 year old patient with a history of some tingling and burning in the feet, she has burning sensations of the legs into the mid thigh level as well.  The patient is being evaluated for a possible neuropathy or a lumbosacral radiculopathy.  NERVE CONDUCTION STUDIES:  Nerve conduction studies were performed on both lower extremities.  The distal motor latencies and motor amplitudes for the peroneal nerves were normal bilaterally.  Study of the left posterior tibial nerve revealed normal distal motor latency and motor amplitude, nerve conduction velocities for the peroneal nerves bilaterally and for the left posterior tibial nerves were normal.  The sural sensory latencies were prolonged on the left, normal on the right, and prolonged for the right peroneal sensory latency, normal on the left.  The F-wave latencies for the posterior tibial nerves were normal bilaterally.  EMG STUDIES:  EMG study was performed on the right lower extremity:  The tibialis anterior muscle reveals 2 to 4K motor units with full recruitment. No fibrillations or positive waves were seen. The peroneus tertius muscle reveals 2 to 4K motor units with slightly reduced recruitment. No fibrillations or positive waves were seen. The medial gastrocnemius muscle reveals 1 to 3K motor units with full recruitment. No fibrillations or positive waves were seen. The vastus lateralis muscle reveals 2 to 4K motor units with full recruitment. No fibrillations or positive waves were seen. The iliopsoas muscle reveals 2 to 4K motor units with full recruitment. No fibrillations or positive waves were seen. The biceps femoris muscle (long head) reveals 2 to 4K motor units with full recruitment. No fibrillations or positive waves were seen. The lumbosacral paraspinal muscles were tested at 3 levels, and revealed no abnormalities of insertional activity at all 3 levels tested. There was good  relaxation.   IMPRESSION:  Nerve conduction studies done on both lower extremities were notable for mild sensory latency abnormalities suggestive of an early peripheral neuropathy.  EMG evaluation of the right lower extremity was relatively unremarkable, no evidence of an overlying lumbosacral radiculopathy was seen.  Jill Alexanders MD 01/21/2018 9:34 AM  [facility] [address] [place], [place] [address]  Phone [phone] Fax [phone]

## 2018-01-28 ENCOUNTER — Ambulatory Visit: Payer: Medicare Other | Admitting: Adult Health

## 2018-01-29 DIAGNOSIS — Z0000 Encounter for general adult medical examination without abnormal findings: Secondary | ICD-10-CM | POA: Diagnosis not present

## 2018-01-29 DIAGNOSIS — E7800 Pure hypercholesterolemia, unspecified: Secondary | ICD-10-CM | POA: Diagnosis not present

## 2018-01-31 ENCOUNTER — Ambulatory Visit
Admission: RE | Admit: 2018-01-31 | Discharge: 2018-01-31 | Disposition: A | Discharge status: Home / Self Care | Payer: Medicare Other | Source: Ambulatory Visit | Attending: Neurology | Admitting: Neurology

## 2018-01-31 DIAGNOSIS — R202 Paresthesia of skin: Secondary | ICD-10-CM

## 2018-02-01 ENCOUNTER — Telehealth: Payer: Self-pay | Admitting: Neurology

## 2018-02-01 DIAGNOSIS — M545 Low back pain, unspecified: Secondary | ICD-10-CM

## 2018-02-01 DIAGNOSIS — G8929 Other chronic pain: Secondary | ICD-10-CM

## 2018-02-01 MED ORDER — GABAPENTIN 100 MG PO CAPS: 100.0000 mg | ORAL_CAPSULE | Freq: Three times a day (TID) | ORAL | 3 refills | Status: DC

## 2018-02-01 NOTE — Telephone Encounter (Signed)
I called the patient.  The MRI of the lumbar spine does not show an etiology for her back pain and leg discomfort, I will get the patient into physical therapy for her low back, start low-dose gabapentin.   MRI lumbar 02/01/18:  IMPRESSION: Abnormal MRI scan of the lumbar spine showing mild facet and disc degenerative changes throughout most prominent at L4-5 where there is mild right-sided foraminal narrowing but no significant compression

## 2018-02-06 DIAGNOSIS — E7800 Pure hypercholesterolemia, unspecified: Secondary | ICD-10-CM | POA: Diagnosis not present

## 2018-02-06 DIAGNOSIS — G43909 Migraine, unspecified, not intractable, without status migrainosus: Secondary | ICD-10-CM | POA: Diagnosis not present

## 2018-02-06 DIAGNOSIS — G629 Polyneuropathy, unspecified: Secondary | ICD-10-CM | POA: Diagnosis not present

## 2018-02-06 DIAGNOSIS — Z01419 Encounter for gynecological examination (general) (routine) without abnormal findings: Secondary | ICD-10-CM | POA: Diagnosis not present

## 2018-02-06 DIAGNOSIS — F418 Other specified anxiety disorders: Secondary | ICD-10-CM | POA: Diagnosis not present

## 2018-02-06 DIAGNOSIS — J45909 Unspecified asthma, uncomplicated: Secondary | ICD-10-CM | POA: Diagnosis not present

## 2018-02-06 DIAGNOSIS — Z124 Encounter for screening for malignant neoplasm of cervix: Secondary | ICD-10-CM | POA: Diagnosis not present

## 2018-02-06 DIAGNOSIS — K900 Celiac disease: Secondary | ICD-10-CM | POA: Diagnosis not present

## 2018-02-09 ENCOUNTER — Other Ambulatory Visit: Payer: Self-pay | Admitting: Neurology

## 2018-02-09 MED ORDER — DEXAMETHASONE 2 MG PO TABS: ORAL_TABLET | ORAL | 1 refills | Status: DC

## 2018-02-13 ENCOUNTER — Ambulatory Visit: Payer: Medicare Other | Admitting: Neurology

## 2018-02-13 ENCOUNTER — Encounter

## 2018-02-27 DIAGNOSIS — H6691 Otitis media, unspecified, right ear: Secondary | ICD-10-CM | POA: Diagnosis not present

## 2018-03-06 DIAGNOSIS — R1031 Right lower quadrant pain: Secondary | ICD-10-CM | POA: Insufficient documentation

## 2018-03-06 DIAGNOSIS — K6389 Other specified diseases of intestine: Secondary | ICD-10-CM | POA: Diagnosis not present

## 2018-03-06 DIAGNOSIS — K3184 Gastroparesis: Secondary | ICD-10-CM | POA: Insufficient documentation

## 2018-03-06 DIAGNOSIS — K900 Celiac disease: Secondary | ICD-10-CM | POA: Diagnosis not present

## 2018-03-18 DIAGNOSIS — H2513 Age-related nuclear cataract, bilateral: Secondary | ICD-10-CM | POA: Diagnosis not present

## 2018-03-18 DIAGNOSIS — H01024 Squamous blepharitis left upper eyelid: Secondary | ICD-10-CM | POA: Diagnosis not present

## 2018-03-18 DIAGNOSIS — H01025 Squamous blepharitis left lower eyelid: Secondary | ICD-10-CM | POA: Diagnosis not present

## 2018-03-18 DIAGNOSIS — H16223 Keratoconjunctivitis sicca, not specified as Sjogren's, bilateral: Secondary | ICD-10-CM | POA: Diagnosis not present

## 2018-03-18 DIAGNOSIS — H01022 Squamous blepharitis right lower eyelid: Secondary | ICD-10-CM | POA: Diagnosis not present

## 2018-03-18 DIAGNOSIS — H01021 Squamous blepharitis right upper eyelid: Secondary | ICD-10-CM | POA: Diagnosis not present

## 2018-03-20 ENCOUNTER — Other Ambulatory Visit: Payer: Self-pay

## 2018-03-20 DIAGNOSIS — Z8049 Family history of malignant neoplasm of other genital organs: Secondary | ICD-10-CM | POA: Insufficient documentation

## 2018-03-21 ENCOUNTER — Ambulatory Visit (INDEPENDENT_AMBULATORY_CARE_PROVIDER_SITE_OTHER): Payer: Medicare Other

## 2018-03-21 ENCOUNTER — Ambulatory Visit (INDEPENDENT_AMBULATORY_CARE_PROVIDER_SITE_OTHER): Payer: Medicare Other | Admitting: Podiatry

## 2018-03-21 DIAGNOSIS — M7752 Other enthesopathy of left foot: Secondary | ICD-10-CM | POA: Diagnosis not present

## 2018-03-21 DIAGNOSIS — M7751 Other enthesopathy of right foot: Secondary | ICD-10-CM

## 2018-03-21 DIAGNOSIS — M7750 Other enthesopathy of unspecified foot: Secondary | ICD-10-CM

## 2018-03-21 DIAGNOSIS — M2041 Other hammer toe(s) (acquired), right foot: Secondary | ICD-10-CM

## 2018-03-21 NOTE — Patient Instructions (Signed)
Pre-Operative Instructions  Congratulations, you have decided to take an important step towards improving your quality of life.  You can be assured that the doctors and staff at [facility] will be with you every step of the way.  Here are some important things you should know:  1. Plan to be at the surgery center/hospital at least 1 (one) hour prior to your scheduled time, unless otherwise directed by the surgical center/hospital staff.  You must have a responsible adult accompany you, remain during the surgery and drive you home.  Make sure you have directions to the surgical center/hospital to ensure you arrive on time. 2. If you are having surgery at [facility] or [facility], you will need a copy of your medical history and physical form from your family physician within one month prior to the date of surgery. We will give you a form for your primary physician to complete.  3. We make every effort to accommodate the date you request for surgery.  However, there are times where surgery dates or times have to be moved.  We will contact you as soon as possible if a change in schedule is required.   4. No aspirin/ibuprofen for one week before surgery.  If you are on aspirin, any non-steroidal anti-inflammatory medications (Mobic, Aleve, Ibuprofen) should not be taken seven (7) days prior to your surgery.  You make take Tylenol for pain prior to surgery.  5. Medications - If you are taking daily heart and blood pressure medications, seizure, reflux, allergy, asthma, anxiety, pain or diabetes medications, make sure you notify the surgery center/hospital before the day of surgery so they can tell you which medications you should take or avoid the day of surgery. 6. No food or drink after midnight the night before surgery unless directed otherwise by surgical center/hospital staff. 7. No alcoholic beverages 24-hours prior to surgery.  No smoking 24-hours prior or 24-hours after  surgery. 8. Wear loose pants or shorts. They should be loose enough to fit over bandages, boots, and casts. 9. Don't wear slip-on shoes. Sneakers are preferred. 10. Bring your boot with you to the surgery center/hospital.  Also bring crutches or a walker if your physician has prescribed it for you.  If you do not have this equipment, it will be provided for you after surgery. 11. If you have not been contacted by the surgery center/hospital by the day before your surgery, call to confirm the date and time of your surgery. 12. Leave-time from work may vary depending on the type of surgery you have.  Appropriate arrangements should be made prior to surgery with your employer. 13. Prescriptions will be provided immediately following surgery by your doctor.  Fill these as soon as possible after surgery and take the medication as directed. Pain medications will not be refilled on weekends and must be approved by the doctor. 14. Remove nail polish on the operative foot and avoid getting pedicures prior to surgery. 15. Wash the night before surgery.  The night before surgery wash the foot and leg well with water and the antibacterial soap provided. Be sure to pay special attention to beneath the toenails and in between the toes.  Wash for at least three (3) minutes. Rinse thoroughly with water and dry well with a towel.  Perform this wash unless told not to do so by your physician.  Enclosed: 1 Ice pack (please put in freezer the night before surgery)   1 Hibiclens skin cleaner     Pre-op instructions  If you have any questions regarding the instructions, please do not hesitate to call our office.  [place]: [address], , [place] [address] -- [phone]  [place]: [address] [place], [place] [address] -- [phone]  [place]: [address]  [place], [place] [address] -- [phone]  [place]: [address], [address], [place], [place] [address] -- [phone]  Website:  [email or web address] 

## 2018-03-22 ENCOUNTER — Telehealth: Payer: Self-pay | Admitting: Podiatry

## 2018-03-22 NOTE — Telephone Encounter (Signed)
I was calling to confirm my surgery date of 20 December with Dr. March Rummage. I confirmed with her and pt asked what time. I told her the surgical center would give her a call 24-48 hours before hand to let her know as it depends on any changes to their schedules and if they have an children or diabetic patients that needed to go back first. Pt also had questions about what shoes to wear. I asked if she got a cam walker boot or a surgical shoe yesterday and she stated she did not. I told her I would send a message to the nurse about her shoes as I'm unable to answer that because I'm not clinical. Pt asked to be called back at [phone] and stated a message can be left on the home answering machine.

## 2018-03-22 NOTE — Telephone Encounter (Signed)
I called pt, informed she should wear a shoe with a athletic style sole. Pt states she can't wear athletic shoes they rub her ankle. I told pt to wear a regular shoe to the surgery and once home could wear an athletic height soled shoe on the non-surgery foot to keep hip height as close to equal as possible.

## 2018-03-29 ENCOUNTER — Encounter: Payer: Self-pay | Admitting: Podiatry

## 2018-03-29 ENCOUNTER — Other Ambulatory Visit: Payer: Self-pay | Admitting: Podiatry

## 2018-03-29 DIAGNOSIS — M7989 Other specified soft tissue disorders: Secondary | ICD-10-CM | POA: Diagnosis not present

## 2018-03-29 DIAGNOSIS — L989 Disorder of the skin and subcutaneous tissue, unspecified: Secondary | ICD-10-CM | POA: Diagnosis not present

## 2018-03-29 DIAGNOSIS — M2041 Other hammer toe(s) (acquired), right foot: Secondary | ICD-10-CM | POA: Diagnosis not present

## 2018-03-29 DIAGNOSIS — E7800 Pure hypercholesterolemia, unspecified: Secondary | ICD-10-CM | POA: Diagnosis not present

## 2018-03-29 DIAGNOSIS — M67471 Ganglion, right ankle and foot: Secondary | ICD-10-CM

## 2018-03-29 MED ORDER — HYDROCODONE-ACETAMINOPHEN 10-325 MG PO TABS: 1.0000 | ORAL_TABLET | ORAL | 0 refills | Status: DC | PRN

## 2018-03-29 MED ORDER — CEPHALEXIN 500 MG PO CAPS: 500.0000 mg | ORAL_CAPSULE | Freq: Two times a day (BID) | ORAL | 0 refills | Status: DC

## 2018-03-29 NOTE — Progress Notes (Signed)
Rx sent to pharmacy for outpatient surgery. °

## 2018-03-29 NOTE — Progress Notes (Signed)
Patient underwent outpatient surgery at [facility].  DOS: 03/29/18  Procedure: Excision of Ganglion Right Ankle, Hammertoe Right 4th toe, Hammertoe Right 5th toe, Injection of Tendon, Adjacent Tissue Transfer

## 2018-04-04 ENCOUNTER — Ambulatory Visit (INDEPENDENT_AMBULATORY_CARE_PROVIDER_SITE_OTHER): Payer: Self-pay | Admitting: Podiatry

## 2018-04-04 ENCOUNTER — Ambulatory Visit (INDEPENDENT_AMBULATORY_CARE_PROVIDER_SITE_OTHER): Payer: Medicare Other

## 2018-04-04 ENCOUNTER — Encounter: Payer: Self-pay | Admitting: Podiatry

## 2018-04-04 VITALS — BP 109/74 | HR 75 | Temp 97.2°F

## 2018-04-04 DIAGNOSIS — Z9889 Other specified postprocedural states: Secondary | ICD-10-CM

## 2018-04-04 DIAGNOSIS — M2041 Other hammer toe(s) (acquired), right foot: Secondary | ICD-10-CM

## 2018-04-04 DIAGNOSIS — M67471 Ganglion, right ankle and foot: Secondary | ICD-10-CM

## 2018-04-04 NOTE — Progress Notes (Signed)
This patient presents to the office for evaluation of her right foot following surgery by Dr. March Rummage.  Surgery was performed 1 week ago and this is her first postoperative visit.  Patient had a hammertoe repair performed on the fourth and fifth digits right foot and a ganglion excision/calcaneal exostectomy performed right ankle.  She states that she does pretty well during the day but she does feel pain and discomfort at night.  Patient presents to the office today wearing her bandage that was applied at the surgical center.  There was significant bleeding noted from the fourth and fifth digits of the right foot.  She presents the office today for further evaluation and treatment of her surgical foot.  Objective neurovascular status intact.  Good wound coaptation and sutures are intact in the fourth and fifth digits of the right foot.  The incision near the sinus tarsi of the right ankle is poorly coapted.  The skin edges are gapping and subcutaneous tissue can be seen at the site of the surgery.  S/P foot surgery right  POV # 1.  X-rays reveal the absence of the head of the proximal phalanx fourth and fifth digits right foot.  Clinically there was significant bleeding from the fourth and fifth digits of the right foot.  Patient states he is not taking any blood thinners.  Examination of her incision on her right ankle reveals the skin edges are poorly coapted.  Therefore Steri-Strips were reapplied to the incision site and the whole foot was re-bandaged.  This patient is to return to the office in 1 week for further evaluation and treatment. Call if needed.   Gardiner Barefoot DPM

## 2018-04-05 ENCOUNTER — Telehealth: Payer: Self-pay | Admitting: Podiatry

## 2018-04-05 ENCOUNTER — Encounter: Payer: Self-pay | Admitting: Podiatry

## 2018-04-05 MED ORDER — FLUCONAZOLE 150 MG PO TABS: 150.0000 mg | ORAL_TABLET | Freq: Once | ORAL | 0 refills | Status: AC

## 2018-04-05 NOTE — Telephone Encounter (Signed)
I called pt and informed pt Dr. Prudence Davidson had okayed the Diflucan 167m and I had sent the order to [facility]

## 2018-04-05 NOTE — Addendum Note (Signed)
Addended by: Harriett Sine D on: 04/05/2018 10:29 AM   Modules accepted: Orders

## 2018-04-05 NOTE — Telephone Encounter (Signed)
Pt was seen yesterday and has been taking antibiotics and forgot to ask the doctor to call in a prescription for 1 pill to prevent yeast infections. Please give pt a call back.

## 2018-04-11 DIAGNOSIS — N393 Stress incontinence (female) (male): Secondary | ICD-10-CM | POA: Diagnosis not present

## 2018-04-11 DIAGNOSIS — N3281 Overactive bladder: Secondary | ICD-10-CM | POA: Diagnosis not present

## 2018-04-12 ENCOUNTER — Ambulatory Visit (INDEPENDENT_AMBULATORY_CARE_PROVIDER_SITE_OTHER): Payer: Self-pay | Admitting: Podiatry

## 2018-04-12 DIAGNOSIS — Z9889 Other specified postprocedural states: Secondary | ICD-10-CM

## 2018-04-12 DIAGNOSIS — M2041 Other hammer toe(s) (acquired), right foot: Secondary | ICD-10-CM

## 2018-04-12 MED ORDER — HYDROCODONE-ACETAMINOPHEN 10-325 MG PO TABS: 1.0000 | ORAL_TABLET | ORAL | 0 refills | Status: DC | PRN

## 2018-04-19 ENCOUNTER — Ambulatory Visit (INDEPENDENT_AMBULATORY_CARE_PROVIDER_SITE_OTHER): Payer: Medicare Other | Admitting: Podiatry

## 2018-04-19 DIAGNOSIS — Z9889 Other specified postprocedural states: Secondary | ICD-10-CM

## 2018-04-19 NOTE — Progress Notes (Signed)
Subjective:  Patient ID: Katelyn Mcclain, female    DOB: May 31, 1950,  MRN: [id number]  Chief Complaint  Patient presents with  . Routine Post Op    POV#3 12.20.19 Hammertoe repair 4th 5th right, calcaneal exostectomy. Pt states healing well with no concerns.    DOS: 03/29/18 Procedure: Right 4th/5th Hammertoe Repair, Excision of Ganglion Cyst Right Ankle  68 y.o. female returns for post-op check. Doing well denies complaints.  Review of Systems: Negative except as noted in the HPI. Denies N/V/F/Ch.  Past Medical History:  Diagnosis Date  . Abscessed tooth   . Allergic rhinitis   . Asthma   . Asthma    inhaler prn  . Celiac disease   . Depression with anxiety   . Dysmenorrhea   . Gastroparesis   . GERD (gastroesophageal reflux disease)   . Hx of low back pain   . IBS (irritable bowel syndrome)   . Migraine   . Migraine without aura, with intractable migraine, so stated, without mention of status migrainosus 05/20/2013  . Osteopenia     Current Outpatient Medications:  .  acetaminophen (TYLENOL) 500 MG tablet, Take 500 mg by mouth every 6 (six) hours as needed (pain)., Disp: , Rfl:  .  albuterol (PROVENTIL HFA;VENTOLIN HFA) 108 (90 BASE) MCG/ACT inhaler, Inhale 1 puff into the lungs every 6 (six) hours as needed for wheezing or shortness of breath (seasonal allergies). , Disp: , Rfl:  .  b complex vitamins capsule, Take by mouth., Disp: , Rfl:  .  Calcium-Magnesium-Vitamin D (CALCIUM 1200+D3 PO), Take 1,200 mg by mouth daily., Disp: , Rfl:  .  cephALEXin (KEFLEX) 500 MG capsule, Take 1 capsule (500 mg total) by mouth 2 (two) times daily., Disp: 14 capsule, Rfl: 0 .  conjugated estrogens (PREMARIN) vaginal cream, Premarin 0.625 mg/gram vaginal cream  INSERT 0.5 APPLICATOR(S)FUL EVERY DAY BY VAGINAL ROUTE., Disp: , Rfl:  .  cycloSPORINE (RESTASIS) 0.05 % ophthalmic emulsion, Place 1 drop into both eyes at bedtime. , Disp: , Rfl:  .  dexamethasone (DECADRON) 2 MG tablet, Take  3 tablets daily, taper by one tablet every other day until off, Disp: 12 tablet, Rfl: 1 .  diclofenac sodium (VOLTAREN) 1 % GEL, Apply 1/2 inch to painful area once a day, Disp: , Rfl:  .  dicyclomine (BENTYL) 20 MG tablet, dicyclomine 20 mg tablet, Disp: , Rfl:  .  docusate sodium (COLACE) 100 MG capsule, Take 100 mg by mouth 2 (two) times daily., Disp: , Rfl:  .  Eszopiclone (ESZOPICLONE) 3 MG TABS, Take 1.5 mg by mouth at bedtime as needed (sleep). Take immediately before bedtime , Disp: , Rfl:  .  gabapentin (NEURONTIN) 100 MG capsule, Take 1 capsule (100 mg total) by mouth 3 (three) times daily., Disp: 90 capsule, Rfl: 3 .  Garlic (GARLIQUE PO), Take 1 capsule by mouth daily. , Disp: , Rfl:  .  HYDROcodone-acetaminophen (NORCO) 10-325 MG tablet, Take 1 tablet by mouth every 4 (four) hours as needed., Disp: 20 tablet, Rfl: 0 .  hyoscyamine (LEVSIN SL) 0.125 MG SL tablet, Take by mouth., Disp: , Rfl:  .  lipase/protease/amylase (CREON) 36000 UNITS CPEP capsule, Creon 36,000 unit-114,000 unit-180,000 unit capsule,delayed release, Disp: , Rfl:  .  Meth-Hyo-M Bl-Na Phos-Ph Sal (URO-MP) 118 MG CAPS, TAKE ONE CAPSULE BY MOUTH TWICE A DAY AS NEEDED, Disp: , Rfl: 0 .  montelukast (SINGULAIR) 10 MG tablet, Take 10 mg by mouth at bedtime. , Disp: , Rfl:  .  MYRBETRIQ 25 MG TB24 tablet, , Disp: , Rfl:  .  Omega-3 1000 MG CAPS, Take by mouth., Disp: , Rfl:  .  omeprazole (PRILOSEC) 40 MG capsule, Take 40 mg by mouth daily., Disp: , Rfl:  .  ondansetron (ZOFRAN) 4 MG tablet, TAKE 1 TABLET BY MOUTH EVERY 6 HOURS AS NEEDED FOR NAUSEA - IF PHERERGAN NOT WORKING, Disp: , Rfl: 2 .  OVER THE COUNTER MEDICATION, Take 500 mg by mouth daily. Vitamin B5, Disp: , Rfl:  .  Peppermint Oil (IBGARD) 90 MG CPCR, Take by mouth., Disp: , Rfl:  .  Probiotic Product (PROBIOTIC DAILY PO), Take 1 tablet by mouth 2 (two) times daily. , Disp: , Rfl:  .  promethazine (PHENERGAN) 25 MG tablet, Take 1 tablet (25 mg total) by mouth  every 6 (six) hours as needed for nausea or vomiting., Disp: 10 tablet, Rfl: 0 .  pyridoxine (B-6) 200 MG tablet, Take 200 mg by mouth daily., Disp: , Rfl:  .  rizatriptan (MAXALT-MLT) 10 MG disintegrating tablet, Take 1 tablet PO at the onset of migraine and May repeat in 2 hours if needed. Not to exceed 2 tabs/24 hours., Disp: 9 tablet, Rfl: 6 .  solifenacin (VESICARE) 10 MG tablet, Vesicare 10 mg tablet, Disp: , Rfl:  .  vortioxetine HBr (TRINTELLIX) 20 MG TABS, Take 20 mg by mouth at bedtime., Disp: , Rfl:  .  zonisamide (ZONEGRAN) 100 MG capsule, Take 1 capsule (100 mg total) by mouth 2 (two) times daily., Disp: 180 capsule, Rfl: 3 .  zonisamide (ZONEGRAN) 25 MG capsule, TAKE 2 CAPSULE EVERY EVENING WITH 100 MG CAPSULE TO MAKE 150 MG NIGHTLY DOSE., Disp: 180 capsule, Rfl: 3  Current Facility-Administered Medications:  .  albuterol (PROVENTIL) (2.5 MG/3ML) 0.083% nebulizer solution 2.5 mg, 2.5 mg, Nebulization, Once, Dunn, Ryan M, PA-C .  ipratropium (ATROVENT) nebulizer solution 0.5 mg, 0.5 mg, Nebulization, Once, Dunn, Ryan M, PA-C  Social History   Tobacco Use  Smoking Status Former Smoker  Smokeless Tobacco Never Used  Tobacco Comment   IN college 1 year    Allergies  Allergen Reactions  . Adhesive [Tape] Other (See Comments)    Tear's skin off   . Morphine And Related Other (See Comments)    Sensitive to narcotics, worsening migraines.  . Barley Grass Other (See Comments)    Celiac disease   Also rye  . Clonazepam Other (See Comments)    Caused anxiety  . Linaclotide Other (See Comments)    Caused spastic bladder (reaction to Linzess)  . Metronidazole Other (See Comments)    Black tongue and diarrhea  . Oatmeal Other (See Comments)    Celiac disease  . Other     Pt does not use artificial sweeteners   . Wheat Bran Other (See Comments)    Celiac disease   Objective:  There were no vitals filed for this visit. There is no height or weight on file to calculate  BMI. Constitutional Well developed. Well nourished.  Vascular Foot warm and well perfused. Capillary refill normal to all digits.   Neurologic Normal speech. Oriented to person, place, and time. Epicritic sensation to light touch grossly present bilaterally.  Dermatologic Skin healing well without signs of infection. Skin edges well coapted without signs of infection.  Orthopedic: Tenderness to palpation noted about the surgical site.   Radiographs: None Assessment:   1. Post-operative state    Plan:  Patient was evaluated and treated and all questions answered.  S/p  foot surgery right -Progressing as expected post-operatively. -XR: None -WB Status: WBAT in surgical shoe -Sutures: Removed. -Medications: None -Foot redressed.  No follow-ups on file.

## 2018-04-21 NOTE — Progress Notes (Signed)
Subjective:  Patient ID: Katelyn Mcclain, female    DOB: 12/20/1950,  MRN: [id number]  Chief Complaint  Patient presents with  . Routine Post Op    dos 12.20.2019 Hammertoe Repair 4,5 Rt, Calcaneal Exostectomy Rt     DOS: 03/29/18 Procedure: Right fourth fifth hammertoe correction, excision ganglion  68 y.o. female returns for post-op check.  Doing well denies pain  Review of Systems: Negative except as noted in the HPI. Denies N/V/F/Ch.  Past Medical History:  Diagnosis Date  . Abscessed tooth   . Allergic rhinitis   . Asthma   . Asthma    inhaler prn  . Celiac disease   . Depression with anxiety   . Dysmenorrhea   . Gastroparesis   . GERD (gastroesophageal reflux disease)   . Hx of low back pain   . IBS (irritable bowel syndrome)   . Migraine   . Migraine without aura, with intractable migraine, so stated, without mention of status migrainosus 05/20/2013  . Osteopenia     Current Outpatient Medications:  .  acetaminophen (TYLENOL) 500 MG tablet, Take 500 mg by mouth every 6 (six) hours as needed (pain)., Disp: , Rfl:  .  albuterol (PROVENTIL HFA;VENTOLIN HFA) 108 (90 BASE) MCG/ACT inhaler, Inhale 1 puff into the lungs every 6 (six) hours as needed for wheezing or shortness of breath (seasonal allergies). , Disp: , Rfl:  .  b complex vitamins capsule, Take by mouth., Disp: , Rfl:  .  Calcium-Magnesium-Vitamin D (CALCIUM 1200+D3 PO), Take 1,200 mg by mouth daily., Disp: , Rfl:  .  cephALEXin (KEFLEX) 500 MG capsule, Take 1 capsule (500 mg total) by mouth 2 (two) times daily., Disp: 14 capsule, Rfl: 0 .  conjugated estrogens (PREMARIN) vaginal cream, Premarin 0.625 mg/gram vaginal cream  INSERT 0.5 APPLICATOR(S)FUL EVERY DAY BY VAGINAL ROUTE., Disp: , Rfl:  .  cycloSPORINE (RESTASIS) 0.05 % ophthalmic emulsion, Place 1 drop into both eyes at bedtime. , Disp: , Rfl:  .  dexamethasone (DECADRON) 2 MG tablet, Take 3 tablets daily, taper by one tablet every other day until  off, Disp: 12 tablet, Rfl: 1 .  diclofenac sodium (VOLTAREN) 1 % GEL, Apply 1/2 inch to painful area once a day, Disp: , Rfl:  .  dicyclomine (BENTYL) 20 MG tablet, dicyclomine 20 mg tablet, Disp: , Rfl:  .  docusate sodium (COLACE) 100 MG capsule, Take 100 mg by mouth 2 (two) times daily., Disp: , Rfl:  .  Eszopiclone (ESZOPICLONE) 3 MG TABS, Take 1.5 mg by mouth at bedtime as needed (sleep). Take immediately before bedtime , Disp: , Rfl:  .  gabapentin (NEURONTIN) 100 MG capsule, Take 1 capsule (100 mg total) by mouth 3 (three) times daily., Disp: 90 capsule, Rfl: 3 .  Garlic (GARLIQUE PO), Take 1 capsule by mouth daily. , Disp: , Rfl:  .  HYDROcodone-acetaminophen (NORCO) 10-325 MG tablet, Take 1 tablet by mouth every 4 (four) hours as needed., Disp: 20 tablet, Rfl: 0 .  hyoscyamine (LEVSIN SL) 0.125 MG SL tablet, Take by mouth., Disp: , Rfl:  .  lipase/protease/amylase (CREON) 36000 UNITS CPEP capsule, Creon 36,000 unit-114,000 unit-180,000 unit capsule,delayed release, Disp: , Rfl:  .  Meth-Hyo-M Bl-Na Phos-Ph Sal (URO-MP) 118 MG CAPS, TAKE ONE CAPSULE BY MOUTH TWICE A DAY AS NEEDED, Disp: , Rfl: 0 .  montelukast (SINGULAIR) 10 MG tablet, Take 10 mg by mouth at bedtime. , Disp: , Rfl:  .  MYRBETRIQ 25 MG TB24 tablet, , Disp: ,  Rfl:  .  Omega-3 1000 MG CAPS, Take by mouth., Disp: , Rfl:  .  omeprazole (PRILOSEC) 40 MG capsule, Take 40 mg by mouth daily., Disp: , Rfl:  .  ondansetron (ZOFRAN) 4 MG tablet, TAKE 1 TABLET BY MOUTH EVERY 6 HOURS AS NEEDED FOR NAUSEA - IF PHERERGAN NOT WORKING, Disp: , Rfl: 2 .  OVER THE COUNTER MEDICATION, Take 500 mg by mouth daily. Vitamin B5, Disp: , Rfl:  .  Peppermint Oil (IBGARD) 90 MG CPCR, Take by mouth., Disp: , Rfl:  .  Probiotic Product (PROBIOTIC DAILY PO), Take 1 tablet by mouth 2 (two) times daily. , Disp: , Rfl:  .  promethazine (PHENERGAN) 25 MG tablet, Take 1 tablet (25 mg total) by mouth every 6 (six) hours as needed for nausea or vomiting., Disp:  10 tablet, Rfl: 0 .  pyridoxine (B-6) 200 MG tablet, Take 200 mg by mouth daily., Disp: , Rfl:  .  rizatriptan (MAXALT-MLT) 10 MG disintegrating tablet, Take 1 tablet PO at the onset of migraine and May repeat in 2 hours if needed. Not to exceed 2 tabs/24 hours., Disp: 9 tablet, Rfl: 6 .  solifenacin (VESICARE) 10 MG tablet, Vesicare 10 mg tablet, Disp: , Rfl:  .  vortioxetine HBr (TRINTELLIX) 20 MG TABS, Take 20 mg by mouth at bedtime., Disp: , Rfl:  .  zonisamide (ZONEGRAN) 100 MG capsule, Take 1 capsule (100 mg total) by mouth 2 (two) times daily., Disp: 180 capsule, Rfl: 3 .  zonisamide (ZONEGRAN) 25 MG capsule, TAKE 2 CAPSULE EVERY EVENING WITH 100 MG CAPSULE TO MAKE 150 MG NIGHTLY DOSE., Disp: 180 capsule, Rfl: 3  Current Facility-Administered Medications:  .  albuterol (PROVENTIL) (2.5 MG/3ML) 0.083% nebulizer solution 2.5 mg, 2.5 mg, Nebulization, Once, Dunn, Ryan M, PA-C .  ipratropium (ATROVENT) nebulizer solution 0.5 mg, 0.5 mg, Nebulization, Once, Dunn, Ryan M, PA-C  Social History   Tobacco Use  Smoking Status Former Smoker  Smokeless Tobacco Never Used  Tobacco Comment   IN college 1 year    Allergies  Allergen Reactions  . Adhesive [Tape] Other (See Comments)    Tear's skin off   . Morphine And Related Other (See Comments)    Sensitive to narcotics, worsening migraines.  . Barley Grass Other (See Comments)    Celiac disease   Also rye  . Clonazepam Other (See Comments)    Caused anxiety  . Linaclotide Other (See Comments)    Caused spastic bladder (reaction to Linzess)  . Metronidazole Other (See Comments)    Black tongue and diarrhea  . Oatmeal Other (See Comments)    Celiac disease  . Other     Pt does not use artificial sweeteners   . Wheat Bran Other (See Comments)    Celiac disease   Objective:  There were no vitals filed for this visit. There is no height or weight on file to calculate BMI. Constitutional Well developed. Well nourished.  Vascular  Foot warm and well perfused. Capillary refill normal to all digits.   Neurologic Normal speech. Oriented to person, place, and time. Epicritic sensation to light touch grossly present bilaterally.  Dermatologic Skin healing well without signs of infection. Skin edges well coapted without signs of infection.  Orthopedic: Tenderness to palpation noted about the surgical site.   Radiographs: None Assessment:   1. Hammertoe of right foot   2. S/P foot surgery, right    Plan:  Patient was evaluated and treated and all questions answered.  S/p foot surgery right -Progressing as expected post-operatively. -XR: None -WB Status: WBAT in surgical shoe -Sutures: intact. -Medications: Norco refilled. -Foot redressed.  Return in about 1 week (around 04/19/2018) for  patient.

## 2018-04-22 ENCOUNTER — Ambulatory Visit (INDEPENDENT_AMBULATORY_CARE_PROVIDER_SITE_OTHER): Payer: Medicare Other | Admitting: Podiatry

## 2018-04-22 ENCOUNTER — Telehealth: Payer: Self-pay | Admitting: Podiatry

## 2018-04-22 ENCOUNTER — Encounter: Payer: Self-pay | Admitting: Podiatry

## 2018-04-22 VITALS — BP 126/77 | HR 74 | Temp 98.3°F | Resp 16

## 2018-04-22 DIAGNOSIS — Z9889 Other specified postprocedural states: Secondary | ICD-10-CM

## 2018-04-22 NOTE — Progress Notes (Signed)
Subjective:  Patient ID: Katelyn Mcclain, female    DOB: October 31, 1950,  MRN: [id number]  Chief Complaint  Patient presents with  . Post-op Problem    R 5th sx toe "steri-strip fell and I saw that it (skin) opened up" x Today; raw feeling Tx: post op shoe and icing -pt denies N/V?F/CH =pt states compression sock made her foot swell up more and numb    DOS: 03/29/18 Procedure: Right 4th/5th Hammertoe Repair, Excision of Ganglion Cyst Right Ankle  68 y.o. female returns for post-op check.  Review of Systems: Negative except as noted in the HPI. Denies N/V/F/Ch.  Past Medical History:  Diagnosis Date  . Abscessed tooth   . Allergic rhinitis   . Asthma   . Asthma    inhaler prn  . Celiac disease   . Depression with anxiety   . Dysmenorrhea   . Gastroparesis   . GERD (gastroesophageal reflux disease)   . Hx of low back pain   . IBS (irritable bowel syndrome)   . Migraine   . Migraine without aura, with intractable migraine, so stated, without mention of status migrainosus 05/20/2013  . Osteopenia     Current Outpatient Medications:  .  acetaminophen (TYLENOL) 500 MG tablet, Take 500 mg by mouth every 6 (six) hours as needed (pain)., Disp: , Rfl:  .  albuterol (PROVENTIL HFA;VENTOLIN HFA) 108 (90 BASE) MCG/ACT inhaler, Inhale 1 puff into the lungs every 6 (six) hours as needed for wheezing or shortness of breath (seasonal allergies). , Disp: , Rfl:  .  b complex vitamins capsule, Take by mouth., Disp: , Rfl:  .  Calcium-Magnesium-Vitamin D (CALCIUM 1200+D3 PO), Take 1,200 mg by mouth daily., Disp: , Rfl:  .  cephALEXin (KEFLEX) 500 MG capsule, Take 1 capsule (500 mg total) by mouth 2 (two) times daily., Disp: 14 capsule, Rfl: 0 .  conjugated estrogens (PREMARIN) vaginal cream, Premarin 0.625 mg/gram vaginal cream  INSERT 0.5 APPLICATOR(S)FUL EVERY DAY BY VAGINAL ROUTE., Disp: , Rfl:  .  cycloSPORINE (RESTASIS) 0.05 % ophthalmic emulsion, Place 1 drop into both eyes at bedtime. ,  Disp: , Rfl:  .  dexamethasone (DECADRON) 2 MG tablet, Take 3 tablets daily, taper by one tablet every other day until off, Disp: 12 tablet, Rfl: 1 .  diclofenac sodium (VOLTAREN) 1 % GEL, Apply 1/2 inch to painful area once a day, Disp: , Rfl:  .  dicyclomine (BENTYL) 20 MG tablet, dicyclomine 20 mg tablet, Disp: , Rfl:  .  docusate sodium (COLACE) 100 MG capsule, Take 100 mg by mouth 2 (two) times daily., Disp: , Rfl:  .  Eszopiclone (ESZOPICLONE) 3 MG TABS, Take 1.5 mg by mouth at bedtime as needed (sleep). Take immediately before bedtime , Disp: , Rfl:  .  gabapentin (NEURONTIN) 100 MG capsule, Take 1 capsule (100 mg total) by mouth 3 (three) times daily., Disp: 90 capsule, Rfl: 3 .  Garlic (GARLIQUE PO), Take 1 capsule by mouth daily. , Disp: , Rfl:  .  HYDROcodone-acetaminophen (NORCO) 10-325 MG tablet, Take 1 tablet by mouth every 4 (four) hours as needed., Disp: 20 tablet, Rfl: 0 .  hyoscyamine (LEVSIN SL) 0.125 MG SL tablet, Take by mouth., Disp: , Rfl:  .  lipase/protease/amylase (CREON) 36000 UNITS CPEP capsule, Creon 36,000 unit-114,000 unit-180,000 unit capsule,delayed release, Disp: , Rfl:  .  Meth-Hyo-M Bl-Na Phos-Ph Sal (URO-MP) 118 MG CAPS, TAKE ONE CAPSULE BY MOUTH TWICE A DAY AS NEEDED, Disp: , Rfl: 0 .  montelukast (  SINGULAIR) 10 MG tablet, Take 10 mg by mouth at bedtime. , Disp: , Rfl:  .  MYRBETRIQ 25 MG TB24 tablet, , Disp: , Rfl:  .  Omega-3 1000 MG CAPS, Take by mouth., Disp: , Rfl:  .  omeprazole (PRILOSEC) 40 MG capsule, Take 40 mg by mouth daily., Disp: , Rfl:  .  ondansetron (ZOFRAN) 4 MG tablet, TAKE 1 TABLET BY MOUTH EVERY 6 HOURS AS NEEDED FOR NAUSEA - IF PHERERGAN NOT WORKING, Disp: , Rfl: 2 .  OVER THE COUNTER MEDICATION, Take 500 mg by mouth daily. Vitamin B5, Disp: , Rfl:  .  Peppermint Oil (IBGARD) 90 MG CPCR, Take by mouth., Disp: , Rfl:  .  Probiotic Product (PROBIOTIC DAILY PO), Take 1 tablet by mouth 2 (two) times daily. , Disp: , Rfl:  .  promethazine  (PHENERGAN) 25 MG tablet, Take 1 tablet (25 mg total) by mouth every 6 (six) hours as needed for nausea or vomiting., Disp: 10 tablet, Rfl: 0 .  pyridoxine (B-6) 200 MG tablet, Take 200 mg by mouth daily., Disp: , Rfl:  .  rizatriptan (MAXALT-MLT) 10 MG disintegrating tablet, Take 1 tablet PO at the onset of migraine and May repeat in 2 hours if needed. Not to exceed 2 tabs/24 hours., Disp: 9 tablet, Rfl: 6 .  solifenacin (VESICARE) 10 MG tablet, Vesicare 10 mg tablet, Disp: , Rfl:  .  vortioxetine HBr (TRINTELLIX) 20 MG TABS, Take 20 mg by mouth at bedtime., Disp: , Rfl:  .  zonisamide (ZONEGRAN) 100 MG capsule, Take 1 capsule (100 mg total) by mouth 2 (two) times daily., Disp: 180 capsule, Rfl: 3 .  zonisamide (ZONEGRAN) 25 MG capsule, TAKE 2 CAPSULE EVERY EVENING WITH 100 MG CAPSULE TO MAKE 150 MG NIGHTLY DOSE., Disp: 180 capsule, Rfl: 3  Current Facility-Administered Medications:  .  albuterol (PROVENTIL) (2.5 MG/3ML) 0.083% nebulizer solution 2.5 mg, 2.5 mg, Nebulization, Once, Dunn, Ryan M, PA-C .  ipratropium (ATROVENT) nebulizer solution 0.5 mg, 0.5 mg, Nebulization, Once, Dunn, Ryan M, PA-C  Social History   Tobacco Use  Smoking Status Former Smoker  Smokeless Tobacco Never Used  Tobacco Comment   IN college 1 year    Allergies  Allergen Reactions  . Adhesive [Tape] Other (See Comments)    Tear's skin off   . Morphine And Related Other (See Comments)    Sensitive to narcotics, worsening migraines.  . Barley Grass Other (See Comments)    Celiac disease   Also rye  . Clonazepam Other (See Comments)    Caused anxiety  . Linaclotide Other (See Comments)    Caused spastic bladder (reaction to Linzess)  . Metronidazole Other (See Comments)    Black tongue and diarrhea  . Oatmeal Other (See Comments)    Celiac disease  . Other     Pt does not use artificial sweeteners   . Wheat Bran Other (See Comments)    Celiac disease   Objective:   Vitals:   04/22/18 1329  BP:  126/77  Pulse: 74  Resp: 16  Temp: 98.3 F (36.8 C)   There is no height or weight on file to calculate BMI. Constitutional Well developed. Well nourished.  Vascular Foot warm and well perfused. Capillary refill normal to all digits.   Neurologic Normal speech. Oriented to person, place, and time. Epicritic sensation to light touch grossly present bilaterally.  Dermatologic Skin healing well. No deep dehiscence. Skin edges rolled but healed with central crevice.  Orthopedic: Tenderness to  palpation noted about the surgical site.   Radiographs: None Assessment:   1. Post-operative state    Plan:  Patient was evaluated and treated and all questions answered.  S/p foot surgery right -Progressing as expected post-operatively. -XR: None -WB Status: WBAT in surgical shoe -Sutures: Residual sutures cut out. -Medications: None -Foot redressed.  No follow-ups on file.

## 2018-04-22 NOTE — Telephone Encounter (Signed)
I was seen Friday and had my sutures removed and had my foot wrapped. I cut it off Saturday morning because it was too tight and when I looked this morning my little toe is split open. I don't know what to do.

## 2018-05-03 ENCOUNTER — Ambulatory Visit (INDEPENDENT_AMBULATORY_CARE_PROVIDER_SITE_OTHER): Payer: Medicare Other | Admitting: Podiatry

## 2018-05-03 DIAGNOSIS — Z9889 Other specified postprocedural states: Secondary | ICD-10-CM

## 2018-05-04 NOTE — Progress Notes (Signed)
Subjective:  Patient ID: Katelyn Mcclain, female    DOB: [birth year],  MRN: [id number]  Chief Complaint  Patient presents with  . Routine Post Op    pov#4 dos 12.20.2019 Hammertoe Repair 4,5 Rt, Calcaneal Exostectomy Rt - neuropathy flared up causing issues, but compression socks help alot      DOS: 03/29/18 Procedure: Right 4th/5th Hammertoe Repair, Excision of Ganglion Cyst Right Ankle  68 y.o. female returns for post-op check.  Review of Systems: Negative except as noted in the HPI. Denies N/V/F/Ch.  Past Medical History:  Diagnosis Date  . Abscessed tooth   . Allergic rhinitis   . Asthma   . Asthma    inhaler prn  . Celiac disease   . Depression with anxiety   . Dysmenorrhea   . Gastroparesis   . GERD (gastroesophageal reflux disease)   . Hx of low back pain   . IBS (irritable bowel syndrome)   . Migraine   . Migraine without aura, with intractable migraine, so stated, without mention of status migrainosus 05/20/2013  . Osteopenia     Current Outpatient Medications:  .  acetaminophen (TYLENOL) 500 MG tablet, Take 500 mg by mouth every 6 (six) hours as needed (pain)., Disp: , Rfl:  .  albuterol (PROVENTIL HFA;VENTOLIN HFA) 108 (90 BASE) MCG/ACT inhaler, Inhale 1 puff into the lungs every 6 (six) hours as needed for wheezing or shortness of breath (seasonal allergies). , Disp: , Rfl:  .  b complex vitamins capsule, Take by mouth., Disp: , Rfl:  .  Calcium-Magnesium-Vitamin D (CALCIUM 1200+D3 PO), Take 1,200 mg by mouth daily., Disp: , Rfl:  .  cephALEXin (KEFLEX) 500 MG capsule, Take 1 capsule (500 mg total) by mouth 2 (two) times daily., Disp: 14 capsule, Rfl: 0 .  conjugated estrogens (PREMARIN) vaginal cream, Premarin 0.625 mg/gram vaginal cream  INSERT 0.5 APPLICATOR(S)FUL EVERY DAY BY VAGINAL ROUTE., Disp: , Rfl:  .  cycloSPORINE (RESTASIS) 0.05 % ophthalmic emulsion, Place 1 drop into both eyes at bedtime. , Disp: , Rfl:  .  dexamethasone (DECADRON) 2 MG tablet,  Take 3 tablets daily, taper by one tablet every other day until off, Disp: 12 tablet, Rfl: 1 .  diclofenac sodium (VOLTAREN) 1 % GEL, Apply 1/2 inch to painful area once a day, Disp: , Rfl:  .  dicyclomine (BENTYL) 20 MG tablet, dicyclomine 20 mg tablet, Disp: , Rfl:  .  docusate sodium (COLACE) 100 MG capsule, Take 100 mg by mouth 2 (two) times daily., Disp: , Rfl:  .  Eszopiclone (ESZOPICLONE) 3 MG TABS, Take 1.5 mg by mouth at bedtime as needed (sleep). Take immediately before bedtime , Disp: , Rfl:  .  gabapentin (NEURONTIN) 100 MG capsule, Take 1 capsule (100 mg total) by mouth 3 (three) times daily., Disp: 90 capsule, Rfl: 3 .  Garlic (GARLIQUE PO), Take 1 capsule by mouth daily. , Disp: , Rfl:  .  HYDROcodone-acetaminophen (NORCO) 10-325 MG tablet, Take 1 tablet by mouth every 4 (four) hours as needed., Disp: 20 tablet, Rfl: 0 .  hyoscyamine (LEVSIN SL) 0.125 MG SL tablet, Take by mouth., Disp: , Rfl:  .  lipase/protease/amylase (CREON) 36000 UNITS CPEP capsule, Creon 36,000 unit-114,000 unit-180,000 unit capsule,delayed release, Disp: , Rfl:  .  Meth-Hyo-M Bl-Na Phos-Ph Sal (URO-MP) 118 MG CAPS, TAKE ONE CAPSULE BY MOUTH TWICE A DAY AS NEEDED, Disp: , Rfl: 0 .  montelukast (SINGULAIR) 10 MG tablet, Take 10 mg by mouth at bedtime. , Disp: , Rfl:  .  MYRBETRIQ 25 MG TB24 tablet, , Disp: , Rfl:  .  Omega-3 1000 MG CAPS, Take by mouth., Disp: , Rfl:  .  omeprazole (PRILOSEC) 40 MG capsule, Take 40 mg by mouth daily., Disp: , Rfl:  .  ondansetron (ZOFRAN) 4 MG tablet, TAKE 1 TABLET BY MOUTH EVERY 6 HOURS AS NEEDED FOR NAUSEA - IF PHERERGAN NOT WORKING, Disp: , Rfl: 2 .  OVER THE COUNTER MEDICATION, Take 500 mg by mouth daily. Vitamin B5, Disp: , Rfl:  .  Peppermint Oil (IBGARD) 90 MG CPCR, Take by mouth., Disp: , Rfl:  .  Probiotic Product (PROBIOTIC DAILY PO), Take 1 tablet by mouth 2 (two) times daily. , Disp: , Rfl:  .  promethazine (PHENERGAN) 25 MG tablet, Take 1 tablet (25 mg total) by  mouth every 6 (six) hours as needed for nausea or vomiting., Disp: 10 tablet, Rfl: 0 .  pyridoxine (B-6) 200 MG tablet, Take 200 mg by mouth daily., Disp: , Rfl:  .  rizatriptan (MAXALT-MLT) 10 MG disintegrating tablet, Take 1 tablet PO at the onset of migraine and May repeat in 2 hours if needed. Not to exceed 2 tabs/24 hours., Disp: 9 tablet, Rfl: 6 .  solifenacin (VESICARE) 10 MG tablet, Vesicare 10 mg tablet, Disp: , Rfl:  .  vortioxetine HBr (TRINTELLIX) 20 MG TABS, Take 20 mg by mouth at bedtime., Disp: , Rfl:  .  zonisamide (ZONEGRAN) 100 MG capsule, Take 1 capsule (100 mg total) by mouth 2 (two) times daily., Disp: 180 capsule, Rfl: 3 .  zonisamide (ZONEGRAN) 25 MG capsule, TAKE 2 CAPSULE EVERY EVENING WITH 100 MG CAPSULE TO MAKE 150 MG NIGHTLY DOSE., Disp: 180 capsule, Rfl: 3  Current Facility-Administered Medications:  .  albuterol (PROVENTIL) (2.5 MG/3ML) 0.083% nebulizer solution 2.5 mg, 2.5 mg, Nebulization, Once, Dunn, Ryan M, PA-C .  ipratropium (ATROVENT) nebulizer solution 0.5 mg, 0.5 mg, Nebulization, Once, Dunn, Ryan M, PA-C  Social History   Tobacco Use  Smoking Status Former Smoker  Smokeless Tobacco Never Used  Tobacco Comment   IN college 1 year    Allergies  Allergen Reactions  . Adhesive [Tape] Other (See Comments)    Tear's skin off   . Morphine And Related Other (See Comments)    Sensitive to narcotics, worsening migraines.  . Barley Grass Other (See Comments)    Celiac disease   Also rye  . Clonazepam Other (See Comments)    Caused anxiety  . Linaclotide Other (See Comments)    Caused spastic bladder (reaction to Linzess)  . Metronidazole Other (See Comments)    Black tongue and diarrhea  . Oatmeal Other (See Comments)    Celiac disease  . Other     Pt does not use artificial sweeteners   . Wheat Bran Other (See Comments)    Celiac disease   Objective:   There were no vitals filed for this visit. There is no height or weight on file to  calculate BMI. Constitutional Well developed. Well nourished.  Vascular Foot warm and well perfused. Capillary refill normal to all digits.   Neurologic Normal speech. Oriented to person, place, and time. Epicritic sensation to light touch grossly present bilaterally.  Dermatologic Skin healing well.  Some residual edema of the toes  Orthopedic:  No tenderness to palpation noted about the surgical site.   Radiographs: None Assessment:   1. Post-operative state    Plan:  Patient was evaluated and treated and all questions answered.  S/p foot surgery  right -Progressing as expected post-operatively. -XR: None -WB Status: WBAT in normal shoes.  Transition slowly. -Sutures: out -Discussed that swelling will resolve with time -Medications: None -Foot redressed.  Return in about 4 weeks (around 05/31/2018) for post-op fu .

## 2018-05-14 ENCOUNTER — Other Ambulatory Visit: Payer: Self-pay | Admitting: Neurology

## 2018-05-30 ENCOUNTER — Ambulatory Visit (INDEPENDENT_AMBULATORY_CARE_PROVIDER_SITE_OTHER): Payer: Medicare Other | Admitting: Podiatry

## 2018-05-30 DIAGNOSIS — Z9889 Other specified postprocedural states: Secondary | ICD-10-CM

## 2018-05-30 MED ORDER — HYDROCODONE-ACETAMINOPHEN 5-325 MG PO TABS: 1.0000 | ORAL_TABLET | Freq: Four times a day (QID) | ORAL | 0 refills | Status: DC | PRN

## 2018-06-21 ENCOUNTER — Encounter: Payer: Medicare Other | Admitting: Podiatry

## 2018-06-21 DIAGNOSIS — Z23 Encounter for immunization: Secondary | ICD-10-CM | POA: Diagnosis not present

## 2018-07-02 DIAGNOSIS — J45909 Unspecified asthma, uncomplicated: Secondary | ICD-10-CM | POA: Diagnosis not present

## 2018-07-02 DIAGNOSIS — B370 Candidal stomatitis: Secondary | ICD-10-CM | POA: Diagnosis not present

## 2018-07-05 ENCOUNTER — Encounter: Payer: Medicare Other | Admitting: Podiatry

## 2018-07-30 DIAGNOSIS — R12 Heartburn: Secondary | ICD-10-CM | POA: Diagnosis not present

## 2018-07-30 DIAGNOSIS — A049 Bacterial intestinal infection, unspecified: Secondary | ICD-10-CM | POA: Diagnosis not present

## 2018-07-30 DIAGNOSIS — Z8719 Personal history of other diseases of the digestive system: Secondary | ICD-10-CM | POA: Diagnosis not present

## 2018-07-30 DIAGNOSIS — K3184 Gastroparesis: Secondary | ICD-10-CM | POA: Diagnosis not present

## 2018-08-09 ENCOUNTER — Ambulatory Visit (INDEPENDENT_AMBULATORY_CARE_PROVIDER_SITE_OTHER): Payer: Medicare Other

## 2018-08-09 ENCOUNTER — Other Ambulatory Visit: Payer: Self-pay

## 2018-08-09 ENCOUNTER — Ambulatory Visit (INDEPENDENT_AMBULATORY_CARE_PROVIDER_SITE_OTHER): Payer: Medicare Other | Admitting: Podiatry

## 2018-08-09 ENCOUNTER — Encounter: Payer: Self-pay | Admitting: Podiatry

## 2018-08-09 VITALS — Temp 98.1°F

## 2018-08-09 DIAGNOSIS — M2041 Other hammer toe(s) (acquired), right foot: Secondary | ICD-10-CM

## 2018-08-09 DIAGNOSIS — Z9889 Other specified postprocedural states: Secondary | ICD-10-CM

## 2018-09-08 ENCOUNTER — Other Ambulatory Visit: Payer: Self-pay | Admitting: Adult Health

## 2018-09-08 NOTE — Progress Notes (Signed)
Subjective:  Patient ID: Katelyn Mcclain, female    DOB: Oct 04, 1950,  MRN: [id number]  Chief Complaint  Patient presents with  . Routine Post Op    dos 12.20.2019 Hammertoe Repair 4,5 Rt, Calcaneal Exostectomy Rt   "It feels fine just can't find shoes that are comfy"    DOS: 03/29/18 Procedure: Right 4th/5th Hammertoe Repair, Excision of Ganglion Cyst Right Ankle  68 y.o. female returns for post-op check. Hx as above.  Review of Systems: Negative except as noted in the HPI. Denies N/V/F/Ch.  Past Medical History:  Diagnosis Date  . Abscessed tooth   . Allergic rhinitis   . Asthma   . Asthma    inhaler prn  . Celiac disease   . Depression with anxiety   . Dysmenorrhea   . Gastroparesis   . GERD (gastroesophageal reflux disease)   . Hx of low back pain   . IBS (irritable bowel syndrome)   . Migraine   . Migraine without aura, with intractable migraine, so stated, without mention of status migrainosus 05/20/2013  . Osteopenia     Current Outpatient Medications:  .  acetaminophen (TYLENOL) 500 MG tablet, Take 500 mg by mouth every 6 (six) hours as needed (pain)., Disp: , Rfl:  .  albuterol (PROVENTIL HFA;VENTOLIN HFA) 108 (90 BASE) MCG/ACT inhaler, Inhale 1 puff into the lungs every 6 (six) hours as needed for wheezing or shortness of breath (seasonal allergies). , Disp: , Rfl:  .  b complex vitamins capsule, Take by mouth., Disp: , Rfl:  .  brompheniramine-pseudoephedrine-DM 30-2-10 MG/5ML syrup, TAKE 5ML BY MOUTH EVERY 6 HOURS FOR 10 DAYS, Disp: , Rfl:  .  Calcium-Magnesium-Vitamin D (CALCIUM 1200+D3 PO), Take 1,200 mg by mouth daily., Disp: , Rfl:  .  conjugated estrogens (PREMARIN) vaginal cream, Premarin 0.625 mg/gram vaginal cream  INSERT 0.5 APPLICATOR(S)FUL EVERY DAY BY VAGINAL ROUTE., Disp: , Rfl:  .  cycloSPORINE (RESTASIS) 0.05 % ophthalmic emulsion, Place 1 drop into both eyes at bedtime. , Disp: , Rfl:  .  dicyclomine (BENTYL) 20 MG tablet, dicyclomine 20 mg  tablet, Disp: , Rfl:  .  docusate sodium (COLACE) 100 MG capsule, Take 100 mg by mouth 2 (two) times daily., Disp: , Rfl:  .  Eszopiclone (ESZOPICLONE) 3 MG TABS, Take 1.5 mg by mouth at bedtime as needed (sleep). Take immediately before bedtime , Disp: , Rfl:  .  FLUAD 0.5 ML SUSY, TO BE ADMINISTERED BY PHARMACIST FOR IMMUNIZATION, Disp: , Rfl:  .  Garlic (GARLIQUE PO), Take 1 capsule by mouth daily. , Disp: , Rfl:  .  hyoscyamine (LEVSIN SL) 0.125 MG SL tablet, Take by mouth., Disp: , Rfl:  .  Meth-Hyo-M Bl-Na Phos-Ph Sal (URO-MP) 118 MG CAPS, TAKE ONE CAPSULE BY MOUTH TWICE A DAY AS NEEDED, Disp: , Rfl: 0 .  montelukast (SINGULAIR) 10 MG tablet, Take 10 mg by mouth at bedtime. , Disp: , Rfl:  .  MYRBETRIQ 25 MG TB24 tablet, , Disp: , Rfl:  .  nystatin (MYCOSTATIN) 100000 UNIT/ML suspension, TAKE 4 ML BY MOUTH FOUR TIMES A DAY, Disp: , Rfl:  .  Omega-3 1000 MG CAPS, Take by mouth., Disp: , Rfl:  .  omeprazole (PRILOSEC) 40 MG capsule, Take 40 mg by mouth daily., Disp: , Rfl:  .  ondansetron (ZOFRAN) 4 MG tablet, TAKE 1 TABLET BY MOUTH EVERY 6 HOURS AS NEEDED FOR NAUSEA - IF PHERERGAN NOT WORKING, Disp: , Rfl: 2 .  OVER THE COUNTER MEDICATION, Take  500 mg by mouth daily. Vitamin B5, Disp: , Rfl:  .  Probiotic Product (PROBIOTIC DAILY PO), Take 1 tablet by mouth 2 (two) times daily. , Disp: , Rfl:  .  promethazine (PHENERGAN) 25 MG tablet, Take 1 tablet (25 mg total) by mouth every 6 (six) hours as needed for nausea or vomiting., Disp: 10 tablet, Rfl: 0 .  pyridoxine (B-6) 200 MG tablet, Take 200 mg by mouth daily., Disp: , Rfl:  .  rizatriptan (MAXALT-MLT) 10 MG disintegrating tablet, TAKE 1 TABLET AT THE ONSET OF MIGRAINE AND MAY REPEAT IN 2 HOURS IF NEEDED. NOT TO EXCEED 2 TABLETS IN 24 HOURS, Disp: 9 tablet, Rfl: 33 .  vortioxetine HBr (TRINTELLIX) 20 MG TABS, Take 20 mg by mouth at bedtime., Disp: , Rfl:  .  zonisamide (ZONEGRAN) 100 MG capsule, Take 1 capsule (100 mg total) by mouth 2 (two)  times daily., Disp: 180 capsule, Rfl: 3 .  zonisamide (ZONEGRAN) 25 MG capsule, TAKE 2 CAPSULE EVERY EVENING WITH 100 MG CAPSULE TO MAKE 150 MG NIGHTLY DOSE., Disp: 180 capsule, Rfl: 3  Current Facility-Administered Medications:  .  albuterol (PROVENTIL) (2.5 MG/3ML) 0.083% nebulizer solution 2.5 mg, 2.5 mg, Nebulization, Once, Dunn, Ryan M, PA-C .  ipratropium (ATROVENT) nebulizer solution 0.5 mg, 0.5 mg, Nebulization, Once, Dunn, Ryan M, PA-C  Social History   Tobacco Use  Smoking Status Former Smoker  Smokeless Tobacco Never Used  Tobacco Comment   IN college 1 year    Allergies  Allergen Reactions  . Adhesive [Tape] Other (See Comments)    Tear's skin off   . Morphine And Related Other (See Comments)    Sensitive to narcotics, worsening migraines.  . Barley Grass Other (See Comments)    Celiac disease   Also rye  . Clonazepam Other (See Comments)    Caused anxiety  . Linaclotide Other (See Comments)    Caused spastic bladder (reaction to Linzess)  . Metronidazole Other (See Comments)    Black tongue and diarrhea  . Oatmeal Other (See Comments)    Celiac disease  . Other     Pt does not use artificial sweeteners   . Wheat Bran Other (See Comments)    Celiac disease   Objective:   Vitals:   08/09/18 1053  Temp: 98.1 F (36.7 C)   There is no height or weight on file to calculate BMI. Constitutional Well developed. Well nourished.  Vascular Foot warm and well perfused. Capillary refill normal to all digits.   Neurologic Normal speech. Oriented to person, place, and time. Epicritic sensation to light touch grossly present bilaterally.  Dermatologic Skin healed.  Orthopedic:  No tenderness to palpation noted about the surgical sites.   Radiographs: Taken and reviewed c/w post-op state. Assessment:   1. Hammertoe of right foot   2. S/P foot surgery, right    Plan:  Patient was evaluated and treated and all questions answered.  S/p foot surgery right  -Doing well postoperatively.  Able to wear normal shoe gear without issues but only some limitations.  Follow-up as needed should pain persist  No follow-ups on file.

## 2018-09-09 ENCOUNTER — Encounter: Payer: Self-pay | Admitting: *Deleted

## 2018-09-09 NOTE — Telephone Encounter (Signed)
Opened in error

## 2018-09-18 DIAGNOSIS — N393 Stress incontinence (female) (male): Secondary | ICD-10-CM | POA: Diagnosis not present

## 2018-09-18 DIAGNOSIS — N3281 Overactive bladder: Secondary | ICD-10-CM | POA: Diagnosis not present

## 2018-09-18 DIAGNOSIS — R102 Pelvic and perineal pain: Secondary | ICD-10-CM | POA: Diagnosis not present

## 2018-09-20 DIAGNOSIS — L821 Other seborrheic keratosis: Secondary | ICD-10-CM | POA: Diagnosis not present

## 2018-09-20 DIAGNOSIS — L219 Seborrheic dermatitis, unspecified: Secondary | ICD-10-CM | POA: Diagnosis not present

## 2018-09-20 DIAGNOSIS — D229 Melanocytic nevi, unspecified: Secondary | ICD-10-CM | POA: Diagnosis not present

## 2018-10-09 DIAGNOSIS — M79671 Pain in right foot: Secondary | ICD-10-CM | POA: Diagnosis not present

## 2018-10-09 DIAGNOSIS — M722 Plantar fascial fibromatosis: Secondary | ICD-10-CM | POA: Diagnosis not present

## 2018-10-10 DIAGNOSIS — M2560 Stiffness of unspecified joint, not elsewhere classified: Secondary | ICD-10-CM | POA: Diagnosis not present

## 2018-10-10 DIAGNOSIS — M79671 Pain in right foot: Secondary | ICD-10-CM | POA: Diagnosis not present

## 2018-10-10 DIAGNOSIS — M6281 Muscle weakness (generalized): Secondary | ICD-10-CM | POA: Diagnosis not present

## 2018-10-15 DIAGNOSIS — M6281 Muscle weakness (generalized): Secondary | ICD-10-CM | POA: Diagnosis not present

## 2018-10-15 DIAGNOSIS — M79671 Pain in right foot: Secondary | ICD-10-CM | POA: Diagnosis not present

## 2018-10-15 DIAGNOSIS — M2560 Stiffness of unspecified joint, not elsewhere classified: Secondary | ICD-10-CM | POA: Diagnosis not present

## 2018-10-18 DIAGNOSIS — M6281 Muscle weakness (generalized): Secondary | ICD-10-CM | POA: Diagnosis not present

## 2018-10-18 DIAGNOSIS — M79671 Pain in right foot: Secondary | ICD-10-CM | POA: Diagnosis not present

## 2018-10-18 DIAGNOSIS — M2560 Stiffness of unspecified joint, not elsewhere classified: Secondary | ICD-10-CM | POA: Diagnosis not present

## 2018-10-20 NOTE — Progress Notes (Signed)
Subjective:  Patient ID: Katelyn Mcclain, female    DOB: 1950/07/09,  MRN: [id number]  Chief Complaint  Patient presents with  . Foot Pain    bilateral foot       68 y.o. female presents with the above complaint. Reports pain in the right 4/th and 5th toes and a painful bump on the outside of her right heel. Present for years. Difficulty with shoes.   Review of Systems: Negative except as noted in the HPI. Denies N/V/F/Ch.  Past Medical History:  Diagnosis Date  . Abscessed tooth   . Allergic rhinitis   . Asthma   . Asthma    inhaler prn  . Celiac disease   . Depression with anxiety   . Dysmenorrhea   . Gastroparesis   . GERD (gastroesophageal reflux disease)   . Hx of low back pain   . IBS (irritable bowel syndrome)   . Migraine   . Migraine without aura, with intractable migraine, so stated, without mention of status migrainosus 05/20/2013  . Osteopenia     Current Outpatient Medications:  .  acetaminophen (TYLENOL) 500 MG tablet, Take 500 mg by mouth every 6 (six) hours as needed (pain)., Disp: , Rfl:  .  albuterol (PROVENTIL HFA;VENTOLIN HFA) 108 (90 BASE) MCG/ACT inhaler, Inhale 1 puff into the lungs every 6 (six) hours as needed for wheezing or shortness of breath (seasonal allergies). , Disp: , Rfl:  .  b complex vitamins capsule, Take by mouth., Disp: , Rfl:  .  brompheniramine-pseudoephedrine-DM 30-2-10 MG/5ML syrup, TAKE 5ML BY MOUTH EVERY 6 HOURS FOR 10 DAYS, Disp: , Rfl:  .  Calcium-Magnesium-Vitamin D (CALCIUM 1200+D3 PO), Take 1,200 mg by mouth daily., Disp: , Rfl:  .  conjugated estrogens (PREMARIN) vaginal cream, Premarin 0.625 mg/gram vaginal cream  INSERT 0.5 APPLICATOR(S)FUL EVERY DAY BY VAGINAL ROUTE., Disp: , Rfl:  .  cycloSPORINE (RESTASIS) 0.05 % ophthalmic emulsion, Place 1 drop into both eyes at bedtime. , Disp: , Rfl:  .  dicyclomine (BENTYL) 20 MG tablet, dicyclomine 20 mg tablet, Disp: , Rfl:  .  docusate sodium (COLACE) 100 MG capsule, Take 100  mg by mouth 2 (two) times daily., Disp: , Rfl:  .  Eszopiclone (ESZOPICLONE) 3 MG TABS, Take 1.5 mg by mouth at bedtime as needed (sleep). Take immediately before bedtime , Disp: , Rfl:  .  FLUAD 0.5 ML SUSY, TO BE ADMINISTERED BY PHARMACIST FOR IMMUNIZATION, Disp: , Rfl:  .  Garlic (GARLIQUE PO), Take 1 capsule by mouth daily. , Disp: , Rfl:  .  hyoscyamine (LEVSIN SL) 0.125 MG SL tablet, Take by mouth., Disp: , Rfl:  .  Meth-Hyo-M Bl-Na Phos-Ph Sal (URO-MP) 118 MG CAPS, TAKE ONE CAPSULE BY MOUTH TWICE A DAY AS NEEDED, Disp: , Rfl: 0 .  montelukast (SINGULAIR) 10 MG tablet, Take 10 mg by mouth at bedtime. , Disp: , Rfl:  .  MYRBETRIQ 25 MG TB24 tablet, , Disp: , Rfl:  .  nystatin (MYCOSTATIN) 100000 UNIT/ML suspension, TAKE 4 ML BY MOUTH FOUR TIMES A DAY, Disp: , Rfl:  .  Omega-3 1000 MG CAPS, Take by mouth., Disp: , Rfl:  .  omeprazole (PRILOSEC) 40 MG capsule, Take 40 mg by mouth daily., Disp: , Rfl:  .  ondansetron (ZOFRAN) 4 MG tablet, TAKE 1 TABLET BY MOUTH EVERY 6 HOURS AS NEEDED FOR NAUSEA - IF PHERERGAN NOT WORKING, Disp: , Rfl: 2 .  OVER THE COUNTER MEDICATION, Take 500 mg by mouth daily. Vitamin  B5, Disp: , Rfl:  .  Probiotic Product (PROBIOTIC DAILY PO), Take 1 tablet by mouth 2 (two) times daily. , Disp: , Rfl:  .  promethazine (PHENERGAN) 25 MG tablet, Take 1 tablet (25 mg total) by mouth every 6 (six) hours as needed for nausea or vomiting., Disp: 10 tablet, Rfl: 0 .  pyridoxine (B-6) 200 MG tablet, Take 200 mg by mouth daily., Disp: , Rfl:  .  rizatriptan (MAXALT-MLT) 10 MG disintegrating tablet, TAKE 1 TABLET AT THE ONSET OF MIGRAINE AND MAY REPEAT IN 2 HOURS IF NEEDED. NOT TO EXCEED 2 TABLETS IN 24 HOURS, Disp: 9 tablet, Rfl: 33 .  vortioxetine HBr (TRINTELLIX) 20 MG TABS, Take 20 mg by mouth at bedtime., Disp: , Rfl:  .  zonisamide (ZONEGRAN) 100 MG capsule, TAKE 1 CAPSULE TWICE A DAY, Disp: 180 capsule, Rfl: 3 .  zonisamide (ZONEGRAN) 25 MG capsule, TAKE 2 CAPSULE EVERY EVENING  WITH 100 MG CAPSULE TO MAKE 150 MG NIGHTLY DOSE., Disp: 180 capsule, Rfl: 3  Current Facility-Administered Medications:  .  albuterol (PROVENTIL) (2.5 MG/3ML) 0.083% nebulizer solution 2.5 mg, 2.5 mg, Nebulization, Once, Dunn, Ryan M, PA-C .  ipratropium (ATROVENT) nebulizer solution 0.5 mg, 0.5 mg, Nebulization, Once, Dunn, Ryan M, PA-C  Social History   Tobacco Use  Smoking Status Former Smoker  Smokeless Tobacco Never Used  Tobacco Comment   IN college 1 year    Allergies  Allergen Reactions  . Adhesive [Tape] Other (See Comments)    Tear's skin off   . Morphine And Related Other (See Comments)    Sensitive to narcotics, worsening migraines.  . Barley Grass Other (See Comments)    Celiac disease   Also rye  . Clonazepam Other (See Comments)    Caused anxiety  . Linaclotide Other (See Comments)    Caused spastic bladder (reaction to Linzess)  . Metronidazole Other (See Comments)    Black tongue and diarrhea  . Oatmeal Other (See Comments)    Celiac disease  . Other     Pt does not use artificial sweeteners   . Wheat Bran Other (See Comments)    Celiac disease   Objective:  There were no vitals filed for this visit. There is no height or weight on file to calculate BMI. Constitutional Well developed. Well nourished.  Vascular Dorsalis pedis pulses palpable bilaterally. Posterior tibial pulses palpable bilaterally. Capillary refill normal to all digits.  No cyanosis or clubbing noted. Pedal hair growth normal.  Neurologic Normal speech. Oriented to person, place, and time. Epicritic sensation to light touch grossly present bilaterally.  Dermatologic Nails well groomed and normal in appearance. No open wounds. No skin lesions.  Orthopedic: Painful hammertoed R 4th/5th toes, pain to palpation Large painful prominence lateral heel right   Radiographs: Taken and reviewed digital contractures noted. Assessment:   1. Tendinitis of ankle or foot   2. Hammertoe of  right foot    Plan:  Patient was evaluated and treated and all questions answered.  Hammertoes 4/5 Right, Calcaneal Exosotis -Patient has failed all conservative therapy and wishes to proceed with surgical intervention. All risks, benefits, and alternatives discussed with patient. No guarantees given. Consent reviewed and signed by patient. -Planned procedures: Right 4th/5th Hammertoe Repair, Exostectomy Right Ankle   No follow-ups on file.

## 2018-10-20 NOTE — Progress Notes (Signed)
Subjective:  Patient ID: Katelyn Mcclain, female    DOB: 02-17-1951,  MRN: [id number]  Chief Complaint  Patient presents with  . Hammer Toe    Right 4th and 5th hammertoe correction f/u. Pt states toes are feeling better but still has some lack of mobility in them.    DOS: 03/29/18 Procedure: Right 4th/5th Hammertoe Repair, Excision of Ganglion Cyst Right Ankle  68 y.o. female returns for post-op check. Hx as above.  Review of Systems: Negative except as noted in the HPI. Denies N/V/F/Ch.  Past Medical History:  Diagnosis Date  . Abscessed tooth   . Allergic rhinitis   . Asthma   . Asthma    inhaler prn  . Celiac disease   . Depression with anxiety   . Dysmenorrhea   . Gastroparesis   . GERD (gastroesophageal reflux disease)   . Hx of low back pain   . IBS (irritable bowel syndrome)   . Migraine   . Migraine without aura, with intractable migraine, so stated, without mention of status migrainosus 05/20/2013  . Osteopenia     Current Outpatient Medications:  .  acetaminophen (TYLENOL) 500 MG tablet, Take 500 mg by mouth every 6 (six) hours as needed (pain)., Disp: , Rfl:  .  albuterol (PROVENTIL HFA;VENTOLIN HFA) 108 (90 BASE) MCG/ACT inhaler, Inhale 1 puff into the lungs every 6 (six) hours as needed for wheezing or shortness of breath (seasonal allergies). , Disp: , Rfl:  .  b complex vitamins capsule, Take by mouth., Disp: , Rfl:  .  Calcium-Magnesium-Vitamin D (CALCIUM 1200+D3 PO), Take 1,200 mg by mouth daily., Disp: , Rfl:  .  conjugated estrogens (PREMARIN) vaginal cream, Premarin 0.625 mg/gram vaginal cream  INSERT 0.5 APPLICATOR(S)FUL EVERY DAY BY VAGINAL ROUTE., Disp: , Rfl:  .  cycloSPORINE (RESTASIS) 0.05 % ophthalmic emulsion, Place 1 drop into both eyes at bedtime. , Disp: , Rfl:  .  dicyclomine (BENTYL) 20 MG tablet, dicyclomine 20 mg tablet, Disp: , Rfl:  .  docusate sodium (COLACE) 100 MG capsule, Take 100 mg by mouth 2 (two) times daily., Disp: , Rfl:  .   Eszopiclone (ESZOPICLONE) 3 MG TABS, Take 1.5 mg by mouth at bedtime as needed (sleep). Take immediately before bedtime , Disp: , Rfl:  .  Garlic (GARLIQUE PO), Take 1 capsule by mouth daily. , Disp: , Rfl:  .  hyoscyamine (LEVSIN SL) 0.125 MG SL tablet, Take by mouth., Disp: , Rfl:  .  Meth-Hyo-M Bl-Na Phos-Ph Sal (URO-MP) 118 MG CAPS, TAKE ONE CAPSULE BY MOUTH TWICE A DAY AS NEEDED, Disp: , Rfl: 0 .  montelukast (SINGULAIR) 10 MG tablet, Take 10 mg by mouth at bedtime. , Disp: , Rfl:  .  MYRBETRIQ 25 MG TB24 tablet, , Disp: , Rfl:  .  Omega-3 1000 MG CAPS, Take by mouth., Disp: , Rfl:  .  omeprazole (PRILOSEC) 40 MG capsule, Take 40 mg by mouth daily., Disp: , Rfl:  .  ondansetron (ZOFRAN) 4 MG tablet, TAKE 1 TABLET BY MOUTH EVERY 6 HOURS AS NEEDED FOR NAUSEA - IF PHERERGAN NOT WORKING, Disp: , Rfl: 2 .  OVER THE COUNTER MEDICATION, Take 500 mg by mouth daily. Vitamin B5, Disp: , Rfl:  .  Probiotic Product (PROBIOTIC DAILY PO), Take 1 tablet by mouth 2 (two) times daily. , Disp: , Rfl:  .  promethazine (PHENERGAN) 25 MG tablet, Take 1 tablet (25 mg total) by mouth every 6 (six) hours as needed for nausea or vomiting.,  Disp: 10 tablet, Rfl: 0 .  pyridoxine (B-6) 200 MG tablet, Take 200 mg by mouth daily., Disp: , Rfl:  .  rizatriptan (MAXALT-MLT) 10 MG disintegrating tablet, TAKE 1 TABLET AT THE ONSET OF MIGRAINE AND MAY REPEAT IN 2 HOURS IF NEEDED. NOT TO EXCEED 2 TABLETS IN 24 HOURS, Disp: 9 tablet, Rfl: 33 .  vortioxetine HBr (TRINTELLIX) 20 MG TABS, Take 20 mg by mouth at bedtime., Disp: , Rfl:  .  zonisamide (ZONEGRAN) 25 MG capsule, TAKE 2 CAPSULE EVERY EVENING WITH 100 MG CAPSULE TO MAKE 150 MG NIGHTLY DOSE., Disp: 180 capsule, Rfl: 3 .  brompheniramine-pseudoephedrine-DM 30-2-10 MG/5ML syrup, TAKE 5ML BY MOUTH EVERY 6 HOURS FOR 10 DAYS, Disp: , Rfl:  .  FLUAD 0.5 ML SUSY, TO BE ADMINISTERED BY PHARMACIST FOR IMMUNIZATION, Disp: , Rfl:  .  nystatin (MYCOSTATIN) 100000 UNIT/ML suspension,  TAKE 4 ML BY MOUTH FOUR TIMES A DAY, Disp: , Rfl:  .  zonisamide (ZONEGRAN) 100 MG capsule, TAKE 1 CAPSULE TWICE A DAY, Disp: 180 capsule, Rfl: 3  Current Facility-Administered Medications:  .  albuterol (PROVENTIL) (2.5 MG/3ML) 0.083% nebulizer solution 2.5 mg, 2.5 mg, Nebulization, Once, Dunn, Ryan M, PA-C .  ipratropium (ATROVENT) nebulizer solution 0.5 mg, 0.5 mg, Nebulization, Once, Dunn, Ryan M, PA-C  Social History   Tobacco Use  Smoking Status Former Smoker  Smokeless Tobacco Never Used  Tobacco Comment   IN college 1 year    Allergies  Allergen Reactions  . Adhesive [Tape] Other (See Comments)    Tear's skin off   . Morphine And Related Other (See Comments)    Sensitive to narcotics, worsening migraines.  . Barley Grass Other (See Comments)    Celiac disease   Also rye  . Clonazepam Other (See Comments)    Caused anxiety  . Linaclotide Other (See Comments)    Caused spastic bladder (reaction to Linzess)  . Metronidazole Other (See Comments)    Black tongue and diarrhea  . Oatmeal Other (See Comments)    Celiac disease  . Other     Pt does not use artificial sweeteners   . Wheat Bran Other (See Comments)    Celiac disease   Objective:   There were no vitals filed for this visit. There is no height or weight on file to calculate BMI. Constitutional Well developed. Well nourished.  Vascular Foot warm and well perfused. Capillary refill normal to all digits.   Neurologic Normal speech. Oriented to person, place, and time. Epicritic sensation to light touch grossly present bilaterally.  Dermatologic Skin healing well.  Some residual edema of the toes  Orthopedic:  No tenderness to palpation noted about the surgical site.   Radiographs: None Assessment:   No diagnosis found. Plan:  Patient was evaluated and treated and all questions answered.  S/p foot surgery right -Progressing as expected post-operatively. -XR: None -WB Status: WBAT in normal shoes.   Transition slowly. -Sutures: out -Medications: None -Foot redressed.  Return in about 1 month (around 06/28/2018).

## 2018-10-21 DIAGNOSIS — M6281 Muscle weakness (generalized): Secondary | ICD-10-CM | POA: Diagnosis not present

## 2018-10-21 DIAGNOSIS — M2560 Stiffness of unspecified joint, not elsewhere classified: Secondary | ICD-10-CM | POA: Diagnosis not present

## 2018-10-21 DIAGNOSIS — M79671 Pain in right foot: Secondary | ICD-10-CM | POA: Diagnosis not present

## 2018-10-23 DIAGNOSIS — M6281 Muscle weakness (generalized): Secondary | ICD-10-CM | POA: Diagnosis not present

## 2018-10-23 DIAGNOSIS — M2560 Stiffness of unspecified joint, not elsewhere classified: Secondary | ICD-10-CM | POA: Diagnosis not present

## 2018-10-23 DIAGNOSIS — M79671 Pain in right foot: Secondary | ICD-10-CM | POA: Diagnosis not present

## 2018-10-23 DIAGNOSIS — I10 Essential (primary) hypertension: Secondary | ICD-10-CM | POA: Diagnosis not present

## 2018-10-28 DIAGNOSIS — M6281 Muscle weakness (generalized): Secondary | ICD-10-CM | POA: Diagnosis not present

## 2018-10-28 DIAGNOSIS — I10 Essential (primary) hypertension: Secondary | ICD-10-CM | POA: Diagnosis not present

## 2018-10-28 DIAGNOSIS — M79671 Pain in right foot: Secondary | ICD-10-CM | POA: Diagnosis not present

## 2018-10-28 DIAGNOSIS — M2560 Stiffness of unspecified joint, not elsewhere classified: Secondary | ICD-10-CM | POA: Diagnosis not present

## 2018-10-31 DIAGNOSIS — M2560 Stiffness of unspecified joint, not elsewhere classified: Secondary | ICD-10-CM | POA: Diagnosis not present

## 2018-10-31 DIAGNOSIS — M79671 Pain in right foot: Secondary | ICD-10-CM | POA: Diagnosis not present

## 2018-10-31 DIAGNOSIS — M6281 Muscle weakness (generalized): Secondary | ICD-10-CM | POA: Diagnosis not present

## 2018-11-06 DIAGNOSIS — M6281 Muscle weakness (generalized): Secondary | ICD-10-CM | POA: Diagnosis not present

## 2018-11-06 DIAGNOSIS — M79671 Pain in right foot: Secondary | ICD-10-CM | POA: Diagnosis not present

## 2018-11-06 DIAGNOSIS — M2560 Stiffness of unspecified joint, not elsewhere classified: Secondary | ICD-10-CM | POA: Diagnosis not present

## 2018-11-08 DIAGNOSIS — M79671 Pain in right foot: Secondary | ICD-10-CM | POA: Diagnosis not present

## 2018-11-08 DIAGNOSIS — M2560 Stiffness of unspecified joint, not elsewhere classified: Secondary | ICD-10-CM | POA: Diagnosis not present

## 2018-11-08 DIAGNOSIS — M6281 Muscle weakness (generalized): Secondary | ICD-10-CM | POA: Diagnosis not present

## 2018-11-11 DIAGNOSIS — M2560 Stiffness of unspecified joint, not elsewhere classified: Secondary | ICD-10-CM | POA: Diagnosis not present

## 2018-11-11 DIAGNOSIS — M79671 Pain in right foot: Secondary | ICD-10-CM | POA: Diagnosis not present

## 2018-11-11 DIAGNOSIS — M6281 Muscle weakness (generalized): Secondary | ICD-10-CM | POA: Diagnosis not present

## 2018-11-15 DIAGNOSIS — M2560 Stiffness of unspecified joint, not elsewhere classified: Secondary | ICD-10-CM | POA: Diagnosis not present

## 2018-11-15 DIAGNOSIS — M6281 Muscle weakness (generalized): Secondary | ICD-10-CM | POA: Diagnosis not present

## 2018-11-15 DIAGNOSIS — M79671 Pain in right foot: Secondary | ICD-10-CM | POA: Diagnosis not present

## 2018-11-20 DIAGNOSIS — M79671 Pain in right foot: Secondary | ICD-10-CM | POA: Diagnosis not present

## 2018-11-20 DIAGNOSIS — M2560 Stiffness of unspecified joint, not elsewhere classified: Secondary | ICD-10-CM | POA: Diagnosis not present

## 2018-11-20 DIAGNOSIS — M6281 Muscle weakness (generalized): Secondary | ICD-10-CM | POA: Diagnosis not present

## 2018-11-21 DIAGNOSIS — N3010 Interstitial cystitis (chronic) without hematuria: Secondary | ICD-10-CM | POA: Diagnosis not present

## 2018-11-21 DIAGNOSIS — R102 Pelvic and perineal pain: Secondary | ICD-10-CM | POA: Diagnosis not present

## 2018-11-22 DIAGNOSIS — M6281 Muscle weakness (generalized): Secondary | ICD-10-CM | POA: Diagnosis not present

## 2018-11-22 DIAGNOSIS — M2560 Stiffness of unspecified joint, not elsewhere classified: Secondary | ICD-10-CM | POA: Diagnosis not present

## 2018-11-22 DIAGNOSIS — M79671 Pain in right foot: Secondary | ICD-10-CM | POA: Diagnosis not present

## 2018-11-25 DIAGNOSIS — M79671 Pain in right foot: Secondary | ICD-10-CM | POA: Diagnosis not present

## 2018-11-25 DIAGNOSIS — M722 Plantar fascial fibromatosis: Secondary | ICD-10-CM | POA: Diagnosis not present

## 2018-11-25 DIAGNOSIS — M6281 Muscle weakness (generalized): Secondary | ICD-10-CM | POA: Diagnosis not present

## 2018-11-25 DIAGNOSIS — M2560 Stiffness of unspecified joint, not elsewhere classified: Secondary | ICD-10-CM | POA: Diagnosis not present

## 2018-11-27 ENCOUNTER — Ambulatory Visit
Admission: RE | Admit: 2018-11-27 | Discharge: 2018-11-27 | Disposition: A | Discharge status: Home / Self Care | Payer: Medicare Other | Source: Ambulatory Visit | Attending: Internal Medicine | Admitting: Internal Medicine

## 2018-11-27 ENCOUNTER — Other Ambulatory Visit: Payer: Self-pay | Admitting: Internal Medicine

## 2018-11-27 ENCOUNTER — Other Ambulatory Visit: Payer: Self-pay

## 2018-11-27 DIAGNOSIS — Z1231 Encounter for screening mammogram for malignant neoplasm of breast: Secondary | ICD-10-CM

## 2018-11-28 DIAGNOSIS — M2560 Stiffness of unspecified joint, not elsewhere classified: Secondary | ICD-10-CM | POA: Diagnosis not present

## 2018-11-28 DIAGNOSIS — M79671 Pain in right foot: Secondary | ICD-10-CM | POA: Diagnosis not present

## 2018-11-28 DIAGNOSIS — M6281 Muscle weakness (generalized): Secondary | ICD-10-CM | POA: Diagnosis not present

## 2018-12-02 DIAGNOSIS — M2560 Stiffness of unspecified joint, not elsewhere classified: Secondary | ICD-10-CM | POA: Diagnosis not present

## 2018-12-02 DIAGNOSIS — M79671 Pain in right foot: Secondary | ICD-10-CM | POA: Diagnosis not present

## 2018-12-02 DIAGNOSIS — M6281 Muscle weakness (generalized): Secondary | ICD-10-CM | POA: Diagnosis not present

## 2018-12-05 DIAGNOSIS — M2560 Stiffness of unspecified joint, not elsewhere classified: Secondary | ICD-10-CM | POA: Diagnosis not present

## 2018-12-05 DIAGNOSIS — N3010 Interstitial cystitis (chronic) without hematuria: Secondary | ICD-10-CM | POA: Diagnosis not present

## 2018-12-05 DIAGNOSIS — N393 Stress incontinence (female) (male): Secondary | ICD-10-CM | POA: Diagnosis not present

## 2018-12-05 DIAGNOSIS — M6281 Muscle weakness (generalized): Secondary | ICD-10-CM | POA: Diagnosis not present

## 2018-12-05 DIAGNOSIS — M79671 Pain in right foot: Secondary | ICD-10-CM | POA: Diagnosis not present

## 2018-12-19 DIAGNOSIS — Z124 Encounter for screening for malignant neoplasm of cervix: Secondary | ICD-10-CM | POA: Diagnosis not present

## 2018-12-19 DIAGNOSIS — Z6823 Body mass index (BMI) 23.0-23.9, adult: Secondary | ICD-10-CM | POA: Diagnosis not present

## 2019-01-06 DIAGNOSIS — M25571 Pain in right ankle and joints of right foot: Secondary | ICD-10-CM | POA: Diagnosis not present

## 2019-01-06 DIAGNOSIS — M722 Plantar fascial fibromatosis: Secondary | ICD-10-CM | POA: Diagnosis not present

## 2019-01-06 DIAGNOSIS — M79671 Pain in right foot: Secondary | ICD-10-CM | POA: Diagnosis not present

## 2019-01-14 DIAGNOSIS — M722 Plantar fascial fibromatosis: Secondary | ICD-10-CM | POA: Diagnosis not present

## 2019-01-20 DIAGNOSIS — M722 Plantar fascial fibromatosis: Secondary | ICD-10-CM | POA: Diagnosis not present

## 2019-01-20 DIAGNOSIS — M79671 Pain in right foot: Secondary | ICD-10-CM | POA: Diagnosis not present

## 2019-01-31 DIAGNOSIS — R21 Rash and other nonspecific skin eruption: Secondary | ICD-10-CM | POA: Diagnosis not present

## 2019-01-31 DIAGNOSIS — L239 Allergic contact dermatitis, unspecified cause: Secondary | ICD-10-CM | POA: Diagnosis not present

## 2019-02-04 DIAGNOSIS — E7800 Pure hypercholesterolemia, unspecified: Secondary | ICD-10-CM | POA: Diagnosis not present

## 2019-02-04 DIAGNOSIS — F419 Anxiety disorder, unspecified: Secondary | ICD-10-CM | POA: Diagnosis not present

## 2019-02-06 ENCOUNTER — Other Ambulatory Visit: Payer: Self-pay

## 2019-02-06 DIAGNOSIS — D485 Neoplasm of uncertain behavior of skin: Secondary | ICD-10-CM | POA: Diagnosis not present

## 2019-02-06 DIAGNOSIS — L570 Actinic keratosis: Secondary | ICD-10-CM | POA: Diagnosis not present

## 2019-02-11 DIAGNOSIS — N3010 Interstitial cystitis (chronic) without hematuria: Secondary | ICD-10-CM | POA: Diagnosis not present

## 2019-02-11 DIAGNOSIS — F418 Other specified anxiety disorders: Secondary | ICD-10-CM | POA: Diagnosis not present

## 2019-02-11 DIAGNOSIS — Z0000 Encounter for general adult medical examination without abnormal findings: Secondary | ICD-10-CM | POA: Diagnosis not present

## 2019-02-11 DIAGNOSIS — J45909 Unspecified asthma, uncomplicated: Secondary | ICD-10-CM | POA: Diagnosis not present

## 2019-02-11 DIAGNOSIS — K900 Celiac disease: Secondary | ICD-10-CM | POA: Diagnosis not present

## 2019-02-11 DIAGNOSIS — G43909 Migraine, unspecified, not intractable, without status migrainosus: Secondary | ICD-10-CM | POA: Diagnosis not present

## 2019-02-11 DIAGNOSIS — N3281 Overactive bladder: Secondary | ICD-10-CM | POA: Diagnosis not present

## 2019-02-11 DIAGNOSIS — G629 Polyneuropathy, unspecified: Secondary | ICD-10-CM | POA: Diagnosis not present

## 2019-02-11 DIAGNOSIS — E7800 Pure hypercholesterolemia, unspecified: Secondary | ICD-10-CM | POA: Diagnosis not present

## 2019-02-11 DIAGNOSIS — K219 Gastro-esophageal reflux disease without esophagitis: Secondary | ICD-10-CM | POA: Diagnosis not present

## 2019-02-11 DIAGNOSIS — Z23 Encounter for immunization: Secondary | ICD-10-CM | POA: Diagnosis not present

## 2019-04-12 DIAGNOSIS — Z20828 Contact with and (suspected) exposure to other viral communicable diseases: Secondary | ICD-10-CM | POA: Diagnosis not present

## 2019-04-26 DIAGNOSIS — Z20828 Contact with and (suspected) exposure to other viral communicable diseases: Secondary | ICD-10-CM | POA: Diagnosis not present

## 2019-05-01 ENCOUNTER — Other Ambulatory Visit: Payer: Self-pay

## 2019-05-01 ENCOUNTER — Encounter (HOSPITAL_BASED_OUTPATIENT_CLINIC_OR_DEPARTMENT_OTHER): Payer: Self-pay | Admitting: Emergency Medicine

## 2019-05-01 ENCOUNTER — Emergency Department (HOSPITAL_BASED_OUTPATIENT_CLINIC_OR_DEPARTMENT_OTHER): Payer: Medicare Other

## 2019-05-01 ENCOUNTER — Emergency Department (HOSPITAL_BASED_OUTPATIENT_CLINIC_OR_DEPARTMENT_OTHER)
Admission: EM | Admit: 2019-05-01 | Discharge: 2019-05-01 | Disposition: A | Discharge status: Home / Self Care | Payer: Medicare Other | Attending: Emergency Medicine | Admitting: Emergency Medicine

## 2019-05-01 DIAGNOSIS — R0602 Shortness of breath: Secondary | ICD-10-CM | POA: Insufficient documentation

## 2019-05-01 DIAGNOSIS — R531 Weakness: Secondary | ICD-10-CM | POA: Insufficient documentation

## 2019-05-01 DIAGNOSIS — Z79899 Other long term (current) drug therapy: Secondary | ICD-10-CM | POA: Diagnosis not present

## 2019-05-01 DIAGNOSIS — R05 Cough: Secondary | ICD-10-CM | POA: Insufficient documentation

## 2019-05-01 DIAGNOSIS — R059 Cough, unspecified: Secondary | ICD-10-CM

## 2019-05-01 DIAGNOSIS — Z87891 Personal history of nicotine dependence: Secondary | ICD-10-CM | POA: Insufficient documentation

## 2019-05-01 LAB — CBC WITH DIFFERENTIAL/PLATELET
Abs Immature Granulocytes: 0.85 10*3/uL — ABNORMAL HIGH (ref 0.00–0.07)
Basophils Absolute: 0.1 10*3/uL (ref 0.0–0.1)
Basophils Relative: 0 %
Eosinophils Absolute: 0 10*3/uL (ref 0.0–0.5)
Eosinophils Relative: 0 %
HCT: 42.7 % (ref 36.0–46.0)
Hemoglobin: 14.3 g/dL (ref 12.0–15.0)
Immature Granulocytes: 3 %
Lymphocytes Relative: 7 %
Lymphs Abs: 1.9 10*3/uL (ref 0.7–4.0)
MCH: 30.8 pg (ref 26.0–34.0)
MCHC: 33.5 g/dL (ref 30.0–36.0)
MCV: 91.8 fL (ref 80.0–100.0)
Monocytes Absolute: 2.5 10*3/uL — ABNORMAL HIGH (ref 0.1–1.0)
Monocytes Relative: 9 %
Neutro Abs: 21.1 10*3/uL — ABNORMAL HIGH (ref 1.7–7.7)
Neutrophils Relative %: 81 %
Platelets: 477 10*3/uL — ABNORMAL HIGH (ref 150–400)
RBC: 4.65 MIL/uL (ref 3.87–5.11)
RDW: 12.8 % (ref 11.5–15.5)
Smear Review: NORMAL
WBC: 26.3 10*3/uL — ABNORMAL HIGH (ref 4.0–10.5)
nRBC: 0 % (ref 0.0–0.2)

## 2019-05-01 LAB — COMPREHENSIVE METABOLIC PANEL
ALT: 79 U/L — ABNORMAL HIGH (ref 0–44)
AST: 45 U/L — ABNORMAL HIGH (ref 15–41)
Albumin: 3.3 g/dL — ABNORMAL LOW (ref 3.5–5.0)
Alkaline Phosphatase: 72 U/L (ref 38–126)
Anion gap: 9 (ref 5–15)
BUN: 34 mg/dL — ABNORMAL HIGH (ref 8–23)
CO2: 21 mmol/L — ABNORMAL LOW (ref 22–32)
Calcium: 8.6 mg/dL — ABNORMAL LOW (ref 8.9–10.3)
Chloride: 106 mmol/L (ref 98–111)
Creatinine, Ser: 1.02 mg/dL — ABNORMAL HIGH (ref 0.44–1.00)
GFR calc Af Amer: >60 mL/min (ref >60)
GFR calc non Af Amer: 57 mL/min — ABNORMAL LOW (ref >60)
Glucose, Bld: 98 mg/dL (ref 70–99)
Potassium: 4.1 mmol/L (ref 3.5–5.1)
Sodium: 136 mmol/L (ref 135–145)
Total Bilirubin: 0.5 mg/dL (ref 0.3–1.2)
Total Protein: 6.5 g/dL (ref 6.5–8.1)

## 2019-05-01 LAB — URINALYSIS, ROUTINE W REFLEX MICROSCOPIC
Bilirubin Urine: NEGATIVE
Glucose, UA: NEGATIVE mg/dL
Hgb urine dipstick: NEGATIVE
Ketones, ur: 15 mg/dL — AB
Leukocytes,Ua: NEGATIVE
Nitrite: NEGATIVE
Protein, ur: NEGATIVE mg/dL
Specific Gravity, Urine: 1.02 (ref 1.005–1.030)
pH: 6.5 (ref 5.0–8.0)

## 2019-05-01 LAB — LACTIC ACID, PLASMA: Lactic Acid, Venous: 1.8 mmol/L (ref 0.5–1.9)

## 2019-05-01 MED ORDER — DOXYCYCLINE HYCLATE 100 MG PO CAPS: 100.0000 mg | ORAL_CAPSULE | Freq: Two times a day (BID) | ORAL | 0 refills | Status: DC

## 2019-05-01 MED ORDER — IPRATROPIUM BROMIDE HFA 17 MCG/ACT IN AERS
2.0000 | INHALATION_SPRAY | Freq: Once | RESPIRATORY_TRACT | Status: AC
  Administered 2019-05-01: 2 via RESPIRATORY_TRACT
  Filled 2019-05-01: qty 12.9

## 2019-05-01 MED ORDER — BENZONATATE 100 MG PO CAPS: 100.0000 mg | ORAL_CAPSULE | Freq: Three times a day (TID) | ORAL | 0 refills | Status: DC | PRN

## 2019-05-01 MED ORDER — SODIUM CHLORIDE 0.9 % IV BOLUS
1000.0000 mL | Freq: Once | INTRAVENOUS | Status: AC
  Administered 2019-05-01: 1000 mL via INTRAVENOUS

## 2019-05-01 MED ORDER — DOXYCYCLINE HYCLATE 100 MG PO CAPS: 100.0000 mg | ORAL_CAPSULE | Freq: Two times a day (BID) | ORAL | 0 refills | Status: AC

## 2019-05-01 MED ORDER — ALBUTEROL SULFATE (2.5 MG/3ML) 0.083% IN NEBU: 2.5000 mg | INHALATION_SOLUTION | Freq: Four times a day (QID) | RESPIRATORY_TRACT | 0 refills | Status: DC | PRN

## 2019-05-01 MED ORDER — PREDNISONE 50 MG PO TABS: 60.0000 mg | ORAL_TABLET | Freq: Once | ORAL | Status: DC

## 2019-05-01 NOTE — ED Notes (Signed)
Ambulated , O2 sat % 95-%97

## 2019-05-01 NOTE — ED Provider Notes (Signed)
[facility] Provider Note   CSN: [id number] Arrival date & time: 05/01/19  1009     History Chief Complaint  Patient presents with   Weakness    Katelyn Mcclain is a 69 y.o. female with history of asthma, celiac disease, gastroparesis, GERD, IBS, migraine headaches presents for evaluation of acute onset, persistent shortness of breath for 3 weeks.  Has mild dry cough.  Reports that symptoms began early in the month when her home was being remodeled and her flooring was being replaced.  She reports that she has been exposed to a lot of chemicals over the last few weeks and thinks this could be contributing to her symptoms.  She has been using her albuterol inhaler twice daily with mild temporary improvement.  Reports she typically does not use her albuterol inhaler at all.  She also has a nebulizer machine but has run out of medication for it.  She notes shortness of breath always, initially had a fever and when she was assessed by her PCP at the beginning of the month she was treated with a course of azithromycin.  She did not have a chest x-ray at that time.  Reports she has tested negative for Covid twice, most recently on Saturday.  Yesterday she developed mild dysuria and urinary urgency as well as mild suprapubic abdominal pain.  She denies nausea, vomiting, chest pain, diarrhea.  She thinks that she might be dehydrated and feels as though her mouth is dry.  She has ordered a HEPA filter which should be arriving in the next few days and also moved out of her home into a camper a couple of days ago.  The history is provided by the patient.       Past Medical History:  Diagnosis Date   Abscessed tooth    Allergic rhinitis    Asthma    Asthma    inhaler prn   Celiac disease    Depression with anxiety    Dysmenorrhea    Gastroparesis    GERD (gastroesophageal reflux disease)    Hx of low back pain    IBS (irritable bowel syndrome)     Migraine    Migraine without aura, with intractable migraine, so stated, without mention of status migrainosus 05/20/2013   Osteopenia     Patient Active Problem List   Diagnosis Date Noted   Family history of malignant neoplasm of cervix uteri 03/20/2018   RLQ abdominal pain 03/06/2018   Gastroparesis 03/06/2018   Severe sepsis with septic shock ([place]) 09/16/2017   Celiac disease 09/16/2017   Bacterial overgrowth syndrome 09/16/2017   Tick bites 09/16/2017   AKI (acute kidney injury) ([place]) 09/16/2017   Hyponatremia 09/16/2017   Thrombocytopenia ([place]) 09/16/2017   Atrophic vaginitis 12/14/2016   Fatigue 12/14/2016   Vaginal discharge 12/12/2016   Chest pain 06/15/2013   Intractable migraine without aura 05/20/2013    Past Surgical History:  Procedure Laterality Date   ABDOMINAL SURGERY     BREAST BIOPSY Right    x2   BREAST CYST ASPIRATION  1999   COLONOSCOPY     FOOT SURGERY Left    Bunionectomy   MASS EXCISION  08/08/2011   Procedure: MINOR EXCISION OF MASS;  Surgeon: Cammie Sickle., MD;  Location: [facility];  Service: Orthopedics;  Laterality: Left;  left ring excision cyst proximal phalangeal joint   NISSEN FUNDOPLICATION     ROTATOR CUFF REPAIR Right 05/2015   TONSILLECTOMY  OB History   No obstetric history on file.     Family History  Problem Relation Age of Onset   Stroke Father    Dementia Father    Atrial fibrillation Father    Cancer Father    Hypertension Mother    Scoliosis Mother    Heart disease Sister        Congenital heart disease   Migraines Neg Hx     Social History   Tobacco Use   Smoking status: Former Smoker   Smokeless tobacco: Never Used   Tobacco comment: IN college 1 year  Substance Use Topics   Alcohol use: Yes    Alcohol/week: 0.0 standard drinks    Comment: wine occasionally   Drug use: No    Home Medications Prior to Admission medications   Medication  Sig Start Date End Date Taking? Authorizing Provider  acetaminophen (TYLENOL) 500 MG tablet Take 500 mg by mouth every 6 (six) hours as needed (pain).    [provider]  albuterol (PROVENTIL HFA;VENTOLIN HFA) 108 (90 BASE) MCG/ACT inhaler Inhale 1 puff into the lungs every 6 (six) hours as needed for wheezing or shortness of breath (seasonal allergies).     [provider]  albuterol (PROVENTIL) (2.5 MG/3ML) 0.083% nebulizer solution Take 3 mLs (2.5 mg total) by nebulization every 6 (six) hours as needed for wheezing or shortness of breath. 05/01/19   Nils Flack, Zuri Bradway A, PA-C  b complex vitamins capsule Take by mouth.    [provider]  benzonatate (TESSALON) 100 MG capsule Take 1 capsule (100 mg total) by mouth 3 (three) times daily as needed for cough. 05/01/19   Dyllon Henken A, PA-C  brompheniramine-pseudoephedrine-DM 30-2-10 MG/5ML syrup TAKE 5ML BY MOUTH EVERY 6 HOURS FOR 10 DAYS 07/05/18   [provider]  Calcium-Magnesium-Vitamin D (CALCIUM 1200+D3 PO) Take 1,200 mg by mouth daily.    [provider]  conjugated estrogens (PREMARIN) vaginal cream Premarin 0.625 mg/gram vaginal cream  INSERT 0.5 APPLICATOR(S)FUL EVERY DAY BY VAGINAL ROUTE.    [provider]  cycloSPORINE (RESTASIS) 0.05 % ophthalmic emulsion Place 1 drop into both eyes at bedtime.     [provider]  dicyclomine (BENTYL) 20 MG tablet dicyclomine 20 mg tablet    [provider]  docusate sodium (COLACE) 100 MG capsule Take 100 mg by mouth 2 (two) times daily.    [provider]  doxycycline (VIBRAMYCIN) 100 MG capsule Take 1 capsule (100 mg total) by mouth 2 (two) times daily for 7 days. 05/01/19 05/08/19  Rodell Perna A, PA-C  Eszopiclone (ESZOPICLONE) 3 MG TABS Take 1.5 mg by mouth at bedtime as needed (sleep). Take immediately before bedtime     [provider]  FLUAD 0.5 ML SUSY TO BE ADMINISTERED BY PHARMACIST FOR IMMUNIZATION 06/21/18    [provider]  Garlic (GARLIQUE PO) Take 1 capsule by mouth daily.     [provider]  hyoscyamine (LEVSIN SL) 0.125 MG SL tablet Take by mouth. 03/06/18 06/04/18  [provider]  Meth-Hyo-M Barnett Hatter Phos-Ph Sal (URO-MP) 118 MG CAPS TAKE ONE CAPSULE BY MOUTH TWICE A DAY AS NEEDED 01/04/18   [provider]  montelukast (SINGULAIR) 10 MG tablet Take 10 mg by mouth at bedtime.  11/03/13   [provider]  MYRBETRIQ 25 MG TB24 tablet  01/04/18   [provider]  nystatin (MYCOSTATIN) 100000 UNIT/ML suspension TAKE 4 ML BY MOUTH FOUR TIMES A DAY 07/02/18   [provider]  Omega-3 1000 MG CAPS Take by mouth.    [provider]  omeprazole (PRILOSEC) 40 MG capsule Take 40 mg by mouth daily. 10/10/16   [provider]  ondansetron (ZOFRAN) 4 MG tablet TAKE 1 TABLET BY MOUTH EVERY 6 HOURS AS NEEDED FOR NAUSEA - IF PHERERGAN NOT WORKING 07/30/17   [provider]  OVER THE COUNTER MEDICATION Take 500 mg by mouth daily. Vitamin B5    [provider]  Probiotic Product (PROBIOTIC DAILY PO) Take 1 tablet by mouth 2 (two) times daily.     [provider]  promethazine (PHENERGAN) 25 MG tablet Take 1 tablet (25 mg total) by mouth every 6 (six) hours as needed for nausea or vomiting. 11/30/15   Lawyer, Harrell Gave, PA-C  pyridoxine (B-6) 200 MG tablet Take 200 mg by mouth daily.    [provider]  rizatriptan (MAXALT-MLT) 10 MG disintegrating tablet TAKE 1 TABLET AT THE ONSET OF MIGRAINE AND MAY REPEAT IN 2 HOURS IF NEEDED. NOT TO EXCEED 2 TABLETS IN 24 HOURS 05/14/18   Kathrynn Ducking, MD  vortioxetine HBr (TRINTELLIX) 20 MG TABS Take 20 mg by mouth at bedtime.    [provider]  zonisamide (ZONEGRAN) 100 MG capsule TAKE 1 CAPSULE TWICE A DAY 09/09/18   Kathrynn Ducking, MD  zonisamide (ZONEGRAN) 25 MG capsule TAKE 2 CAPSULE EVERY EVENING WITH 100 MG CAPSULE TO MAKE 150 MG NIGHTLY DOSE. 07/23/17    Ward Givens, NP    Allergies    Adhesive [tape], Morphine and related, Barley grass, Clonazepam, Linaclotide, Metronidazole, Oatmeal, Other, and Wheat bran  Review of Systems   Review of Systems  Constitutional: Positive for fatigue and fever (resolved). Negative for chills.  Respiratory: Positive for cough and shortness of breath.   Cardiovascular: Negative for chest pain.  Gastrointestinal: Positive for abdominal pain. Negative for diarrhea, nausea and vomiting.  Genitourinary: Positive for dysuria and frequency.  Psychiatric/Behavioral: Positive for sleep disturbance. The patient is nervous/anxious.   All other systems reviewed and are negative.   Physical Exam Updated Vital Signs BP 112/68    Pulse 65    Temp 97.8 F (36.6 C) (Oral)    Resp 13    SpO2 96%   Physical Exam Vitals and nursing note reviewed.  Constitutional:      General: She is not in acute distress.    Appearance: She is well-developed.     Comments: Resting in bed, appears mildly anxious  HENT:     Head: Normocephalic and atraumatic.  Eyes:     General:        Right eye: No discharge.        Left eye: No discharge.     Conjunctiva/sclera: Conjunctivae normal.  Neck:     Vascular: No JVD.     Trachea: No tracheal deviation.  Cardiovascular:     Rate and Rhythm: Normal rate and regular rhythm.     Pulses: Normal pulses.     Heart sounds: Normal heart sounds.  Pulmonary:     Effort: Pulmonary effort is normal.     Breath sounds: Normal breath sounds.  Abdominal:     General: Abdomen is flat. Bowel sounds are normal. There is no distension.     Palpations: Abdomen is soft.     Tenderness: There is no right CVA tenderness, left CVA tenderness, guarding or rebound.     Comments: Mild discomfort on palpation of the suprapubic region.  Skin:  General: Skin is warm and dry.     Findings: No erythema.  Neurological:     Mental Status: She is alert.  Psychiatric:        Behavior: Behavior  normal.     ED Results / Procedures / Treatments   Labs (all labs ordered are listed, but only abnormal results are displayed) Labs Reviewed  CBC WITH DIFFERENTIAL/PLATELET - Abnormal; Notable for the following components:      Result Value   WBC 26.3 (*)    Platelets 477 (*)    Neutro Abs 21.1 (*)    Monocytes Absolute 2.5 (*)    Abs Immature Granulocytes 0.85 (*)    All other components within normal limits  URINALYSIS, ROUTINE W REFLEX MICROSCOPIC - Abnormal; Notable for the following components:   Ketones, ur 15 (*)    All other components within normal limits  COMPREHENSIVE METABOLIC PANEL - Abnormal; Notable for the following components:   CO2 21 (*)    BUN 34 (*)    Creatinine, Ser 1.02 (*)    Calcium 8.6 (*)    Albumin 3.3 (*)    AST 45 (*)    ALT 79 (*)    GFR calc non Af Amer 57 (*)    All other components within normal limits  URINE CULTURE  CULTURE, BLOOD (ROUTINE X 2)  CULTURE, BLOOD (ROUTINE X 2)  LACTIC ACID, PLASMA    EKG EKG Interpretation  Date/Time:  Thursday May 01 2019 10:28:07 EST Ventricular Rate:  75 PR Interval:    QRS Duration: 92 QT Interval:  385 QTC Calculation: 430 R Axis:   13 Text Interpretation: Sinus rhythm Low voltage, precordial leads Baseline wander in lead(s) V5 No significant change since last tracing Confirmed by Dorie Rank [phone]) on 05/01/2019 10:44:59 AM   Radiology DG Chest Portable 1 View  Result Date: 05/01/2019 CLINICAL DATA:  Shortness of breath.  Cough. EXAM: PORTABLE CHEST 1 VIEW COMPARISON:  09/16/2017. FINDINGS: Mediastinum and hilar structures normal. Heart size normal. No focal infiltrate. No pleural effusion or pneumothorax. No acute bony abnormality identified. IMPRESSION: No acute cardiopulmonary disease. Electronically Signed   By: Marcello Moores  Register   On: 05/01/2019 11:43    Procedures Procedures (including critical care time)  Medications Ordered in ED Medications  ipratropium (ATROVENT HFA)  inhaler 2 puff (has no administration in time range)  sodium chloride 0.9 % bolus 1,000 mL (0 mLs Intravenous Stopped 05/01/19 1105)    ED Course  I have reviewed the triage vital signs and the nursing notes.  Pertinent labs & imaging results that were available during my care of the patient were reviewed by me and considered in my medical decision making (see chart for details).    MDM Rules/Calculators/A&P                       Katelyn Mcclain was evaluated in Emergency Department on 05/01/2019 for the symptoms described in the history of present illness. She was evaluated in the context of the global COVID-19 pandemic, which necessitated consideration that the patient might be at risk for infection with the SARS-CoV-2 virus that causes COVID-19. Institutional protocols and algorithms that pertain to the evaluation of patients at risk for COVID-19 are in a state of rapid change based on information released by regulatory bodies including the CDC and federal and state organizations. These policies and algorithms were followed during the patient's care in the ED.  Patient presenting for evaluation of  generalized weakness, cough, shortness of breath, has been worsening over the last 3 weeks.  She is afebrile, vital signs are stable in the ED.  She is nontoxic in appearance, no respiratory distress on assessment.  Ambulatory with stable SPO2 saturations.  Chest x-ray shows no acute cardiopulmonary abnormalities.  EKG shows normal sinus rhythm no acute ischemic abnormalities.  Lab work reviewed by me shows leukocytosis of 26.3. She had initially to me she was not on any steroids for her symptoms but upon listing medications noted that she had just completed a 10-day course of dexamethasone.  This is likely the cause of her leukocytosis.  No anemia, no metabolic derangements, BUN and creatinine are mildly elevated, likely in the setting of dehydration.  She has mild ketonuria also suggesting that she is a  little dehydrated but no evidence of UTI or nephrolithiasis on exam today.  Due to her leukocytosis we did obtain a lactic acid and blood cultures, the former of which were negative in the ED.  She was given IV fluids in the ED.  On reevaluation she is resting comfortably, reports she still feels a little weak but better.  Recommend follow-up with PCP for reevaluation of symptoms.  Will discharge with a course of doxycycline given the duration of her symptoms, shortness of breath and cough and negative Covid testing outpatient.  Discussed strict ED return precautions.  Patient verbalized understanding of and agreement with plan and patient is stable for discharge home at this time.  Patient was seen and evaluated by Dr. Tomi Bamberger who agrees with assessment and plan at this time. Final Clinical Impression(s) / ED Diagnoses Final diagnoses:  Generalized weakness  SOB (shortness of breath)  Cough    Rx / DC Orders ED Discharge Orders         Ordered    benzonatate (TESSALON) 100 MG capsule  3 times daily PRN     05/01/19 1407    albuterol (PROVENTIL) (2.5 MG/3ML) 0.083% nebulizer solution  Every 6 hours PRN     05/01/19 1407    doxycycline (VIBRAMYCIN) 100 MG capsule  2 times daily,   Status:  Discontinued     05/01/19 1411    doxycycline (VIBRAMYCIN) 100 MG capsule  2 times daily     05/01/19 1411           Renita Papa, PA-C 05/01/19 1418    Dorie Rank, MD 05/02/19 [phone]

## 2019-05-01 NOTE — ED Triage Notes (Addendum)
Feels week started first week of JAN, saw FP she was coughing, given cough med ,  Given covid  Test x2 neg ,  Feeling wrung out , sob ,  Does not feel like Resuce inhaler not working , doing remodel in house feels like fabric and flooring is over whelming  , feels over whelmed  Feels anxious  And numerous issues

## 2019-05-01 NOTE — Discharge Instructions (Addendum)
Continue to take all of your home medications as prescribed.   Please take all of your antibiotics until finished!   Take your antibiotics with food.  Common side effects of antibiotics include nausea, vomiting, abdominal discomfort, and diarrhea. You may help offset some of this with probiotics which you can buy or get in yogurt. Do not eat  or take the probiotics until 2 hours after your antibiotic.    Use the albuterol inhaler as needed for shortness of breath.  I have refilled your nebulizer solution.  You can take Tessalon as needed for cough.  Drink plenty of fluids and get plenty of rest.  Follow-up with your PCP for reevaluation of symptoms  Return to the emergency department if any concerning signs or symptoms develop such as high fevers, persistent vomiting, loss of consciousness, severe chest pain or shortness of breath.

## 2019-05-01 NOTE — ED Notes (Signed)
Pt on monitor 

## 2019-05-02 ENCOUNTER — Other Ambulatory Visit: Payer: Self-pay | Admitting: Urology

## 2019-05-02 LAB — URINE CULTURE: Culture: NO GROWTH

## 2019-05-05 DIAGNOSIS — D72829 Elevated white blood cell count, unspecified: Secondary | ICD-10-CM | POA: Diagnosis not present

## 2019-05-06 DIAGNOSIS — R0982 Postnasal drip: Secondary | ICD-10-CM | POA: Diagnosis not present

## 2019-05-06 DIAGNOSIS — J45909 Unspecified asthma, uncomplicated: Secondary | ICD-10-CM | POA: Diagnosis not present

## 2019-05-06 DIAGNOSIS — E7800 Pure hypercholesterolemia, unspecified: Secondary | ICD-10-CM | POA: Diagnosis not present

## 2019-05-06 DIAGNOSIS — F419 Anxiety disorder, unspecified: Secondary | ICD-10-CM | POA: Diagnosis not present

## 2019-05-06 DIAGNOSIS — D72829 Elevated white blood cell count, unspecified: Secondary | ICD-10-CM | POA: Diagnosis not present

## 2019-05-06 LAB — CULTURE, BLOOD (ROUTINE X 2)
Culture: NO GROWTH
Special Requests: ADEQUATE

## 2019-05-13 DIAGNOSIS — R0982 Postnasal drip: Secondary | ICD-10-CM | POA: Diagnosis not present

## 2019-05-13 DIAGNOSIS — J0110 Acute frontal sinusitis, unspecified: Secondary | ICD-10-CM | POA: Diagnosis not present

## 2019-05-13 DIAGNOSIS — J45909 Unspecified asthma, uncomplicated: Secondary | ICD-10-CM | POA: Diagnosis not present

## 2019-05-15 DIAGNOSIS — N3010 Interstitial cystitis (chronic) without hematuria: Secondary | ICD-10-CM | POA: Diagnosis not present

## 2019-05-15 DIAGNOSIS — R102 Pelvic and perineal pain: Secondary | ICD-10-CM | POA: Diagnosis not present

## 2019-05-19 DIAGNOSIS — H0102A Squamous blepharitis right eye, upper and lower eyelids: Secondary | ICD-10-CM | POA: Diagnosis not present

## 2019-05-19 DIAGNOSIS — H2513 Age-related nuclear cataract, bilateral: Secondary | ICD-10-CM | POA: Diagnosis not present

## 2019-05-19 DIAGNOSIS — H16223 Keratoconjunctivitis sicca, not specified as Sjogren's, bilateral: Secondary | ICD-10-CM | POA: Diagnosis not present

## 2019-05-19 DIAGNOSIS — H0102B Squamous blepharitis left eye, upper and lower eyelids: Secondary | ICD-10-CM | POA: Diagnosis not present

## 2019-05-20 DIAGNOSIS — R0981 Nasal congestion: Secondary | ICD-10-CM | POA: Diagnosis not present

## 2019-05-26 DIAGNOSIS — J309 Allergic rhinitis, unspecified: Secondary | ICD-10-CM | POA: Diagnosis not present

## 2019-06-02 ENCOUNTER — Other Ambulatory Visit: Payer: Self-pay

## 2019-06-02 ENCOUNTER — Encounter: Payer: Self-pay | Admitting: Adult Health

## 2019-06-02 ENCOUNTER — Ambulatory Visit (INDEPENDENT_AMBULATORY_CARE_PROVIDER_SITE_OTHER): Payer: Medicare Other | Admitting: Adult Health

## 2019-06-02 VITALS — BP 101/64 | HR 81 | Temp 97.2°F | Ht 70.5 in | Wt 168.6 lb

## 2019-06-02 DIAGNOSIS — G43009 Migraine without aura, not intractable, without status migrainosus: Secondary | ICD-10-CM

## 2019-06-02 DIAGNOSIS — R202 Paresthesia of skin: Secondary | ICD-10-CM | POA: Diagnosis not present

## 2019-06-02 MED ORDER — RIZATRIPTAN BENZOATE 10 MG PO TBDP: ORAL_TABLET | ORAL | 3 refills | Status: DC

## 2019-06-02 NOTE — Patient Instructions (Signed)
Your Plan:  Continue Zonegran 100 mg twice a day Continue Maxalt  If your symptoms worsen or you develop new symptoms please let us know.   Thank you for coming to see [place] at [facility]. I hope we have been able to provide you high quality care today.  You may receive a patient satisfaction survey over the next few weeks. We would appreciate your feedback and comments so that we may continue to improve ourselves and the health of our patients.

## 2019-06-02 NOTE — Progress Notes (Signed)
PATIENT: Katelyn Mcclain DOB: June 13, 1950  REASON FOR VISIT: follow up HISTORY FROM: patient  HISTORY OF PRESENT ILLNESS: Today 06/02/19:  Katelyn Mcclain is a 69 year old female with a history of migraine headaches and paresthesias in the lower extremities.  She returns today for follow-up.  She reports that her headaches have increased in the last month or so.  She feels that this is related to the weather.  She states that she is using more Maxalt.  Approximately 2 to 4 tablets a week.  She reduced her Zonegran dose to 100 mg twice a day.  Ultimately she would like to continue to reduce the dose.  She continues to have numbness in the toes.  Nerve conduction studies did reveal a early neuropathy.  She denies any discomfort.  Denies any changes with her gait or balance.  She tried gabapentin but did not like this medication.  She returns today for evaluation.  HISTORY (copied from Dr. Tobey Grim note) Katelyn Mcclain is a 69 year old right-handed white female with a history of migraine headaches and a history of celiac disease.  The celiac disease has been well controlled, the patient mainly has difficulty with constipation at this point.  She was in the hospital on 16 September 2017 with sepsis of unknown source.  The patient seemed to recover from this, she was seen in the emergency room on 01 October 2017 with generalized weakness.  The patient has been able to maintain her weight, she has tried to get back into regular physical activity.  In July 2019 she was hiking and had a fall, she injured her left hip with significant swelling, this remains sore but is gradually improving.  About 1 month ago, the patient began having burning sensations around the knees bilaterally without any actual swelling in the knees or joint pain.  The burning sensations beginning just above the knees and go down below the knees, occasionally in the pretibial area to the top of the foot.  The episodes are more noticeable when she is  inactive, it is keeping her awake at night.  The patient denies any severe weakness, she does feel slightly weak in the legs, she has to think more about how she is walking.  The walking issue began after the sepsis episode.  The patient has a history of migraine headaches, she takes Zonegran for this, over the last week she has had daily headaches and she has had difficulty getting rid of the headache.  The patient denies any changes in bowel or bladder function, she is on Myrbetriq for urinary urgency, she believes that the burning sensation in the knees began shortly after she started Myrbetriq.  The patient denies any neck pain or pain down the arms, she denies low back pain.  She denies any changes in strength or sensation of the arms.  The patient believes that the burning sensations around the knees has slightly improved as time has gone on.  The patient reports that in the spring 2019 she had several tick bites, a Lyme panel and [place] spotted fever panel done in early June were negative.   REVIEW OF SYSTEMS: Out of a complete 14 system review of symptoms, the patient complains only of the following symptoms, and all other reviewed systems are negative.  See HPI  ALLERGIES: Allergies  Allergen Reactions  . Adhesive [Tape] Other (See Comments)    Tear's skin off   . Morphine And Related Other (See Comments)    Sensitive to  narcotics, worsening migraines.  . Barley Grass Other (See Comments)    Celiac disease   Also rye  . Clonazepam Other (See Comments)    Caused anxiety  . Linaclotide Other (See Comments)    Caused spastic bladder (reaction to Linzess)  . Metronidazole Other (See Comments)    Black tongue and diarrhea  . Oatmeal Other (See Comments)    Celiac disease  . Other     Pt does not use artificial sweeteners   . Wheat Bran Other (See Comments)    Celiac disease    HOME MEDICATIONS: Outpatient Medications Prior to Visit  Medication Sig Dispense Refill  .  albuterol (PROVENTIL HFA;VENTOLIN HFA) 108 (90 BASE) MCG/ACT inhaler Inhale 1 puff into the lungs every 6 (six) hours as needed for wheezing or shortness of breath (seasonal allergies).     Marland Kitchen albuterol (PROVENTIL) (2.5 MG/3ML) 0.083% nebulizer solution Take 3 mLs (2.5 mg total) by nebulization every 6 (six) hours as needed for wheezing or shortness of breath. 75 mL 0  . b complex vitamins capsule Take by mouth.    . benzonatate (TESSALON) 100 MG capsule Take 1 capsule (100 mg total) by mouth 3 (three) times daily as needed for cough. 21 capsule 0  . Calcium-Magnesium-Vitamin D (CALCIUM 1200+D3 PO) Take 1,200 mg by mouth daily.    Marland Kitchen conjugated estrogens (PREMARIN) vaginal cream Premarin 0.625 mg/gram vaginal cream  INSERT 0.5 APPLICATOR(S)FUL EVERY DAY BY VAGINAL ROUTE.    Marland Kitchen docusate sodium (COLACE) 100 MG capsule Take 100 mg by mouth 2 (two) times daily.    . Eszopiclone (ESZOPICLONE) 3 MG TABS Take 1.5 mg by mouth at bedtime as needed (sleep). Take immediately before bedtime     . FLUAD 0.5 ML SUSY TO BE ADMINISTERED BY PHARMACIST FOR IMMUNIZATION    . Garlic (GARLIQUE PO) Take 1 capsule by mouth daily.     . Meth-Hyo-M Bl-Na Phos-Ph Sal (URO-MP) 118 MG CAPS TAKE 1 CAPSULE BY MOUTH TWICE A DAY AS NEEDED 30 capsule 5  . montelukast (SINGULAIR) 10 MG tablet Take 10 mg by mouth at bedtime.     Marland Kitchen MYRBETRIQ 25 MG TB24 tablet     . Omega-3 1000 MG CAPS Take by mouth.    Marland Kitchen omeprazole (PRILOSEC) 40 MG capsule Take 40 mg by mouth daily.    . ondansetron (ZOFRAN) 4 MG tablet TAKE 1 TABLET BY MOUTH EVERY 6 HOURS AS NEEDED FOR NAUSEA - IF PHERERGAN NOT WORKING  2  . OVER THE COUNTER MEDICATION Take 500 mg by mouth daily. Vitamin B5    . Probiotic Product (PROBIOTIC DAILY PO) Take 1 tablet by mouth 2 (two) times daily.     . promethazine (PHENERGAN) 25 MG tablet Take 1 tablet (25 mg total) by mouth every 6 (six) hours as needed for nausea or vomiting. 10 tablet 0  . pyridoxine (B-6) 200 MG tablet Take 200 mg  by mouth daily.    . rizatriptan (MAXALT-MLT) 10 MG disintegrating tablet TAKE 1 TABLET AT THE ONSET OF MIGRAINE AND MAY REPEAT IN 2 HOURS IF NEEDED. NOT TO EXCEED 2 TABLETS IN 24 HOURS 9 tablet 33  . vortioxetine HBr (TRINTELLIX) 20 MG TABS Take 20 mg by mouth at bedtime.    Marland Kitchen zonisamide (ZONEGRAN) 100 MG capsule TAKE 1 CAPSULE TWICE A DAY 180 capsule 3  . zonisamide (ZONEGRAN) 25 MG capsule TAKE 2 CAPSULE EVERY EVENING WITH 100 MG CAPSULE TO MAKE 150 MG NIGHTLY DOSE. 180 capsule 3  .  hyoscyamine (LEVSIN SL) 0.125 MG SL tablet Take by mouth.    Marland Kitchen acetaminophen (TYLENOL) 500 MG tablet Take 500 mg by mouth every 6 (six) hours as needed (pain).    . brompheniramine-pseudoephedrine-DM 30-2-10 MG/5ML syrup TAKE 5ML BY MOUTH EVERY 6 HOURS FOR 10 DAYS    . cycloSPORINE (RESTASIS) 0.05 % ophthalmic emulsion Place 1 drop into both eyes at bedtime.     . dicyclomine (BENTYL) 20 MG tablet dicyclomine 20 mg tablet    . nystatin (MYCOSTATIN) 100000 UNIT/ML suspension TAKE 4 ML BY MOUTH FOUR TIMES A DAY     Facility-Administered Medications Prior to Visit  Medication Dose Route Frequency Provider Last Rate Last Admin  . ipratropium (ATROVENT) nebulizer solution 0.5 mg  0.5 mg Nebulization Once Rise Mu, PA-C        PAST MEDICAL HISTORY: Past Medical History:  Diagnosis Date  . Abscessed tooth   . Allergic rhinitis   . Asthma   . Asthma    inhaler prn  . Celiac disease   . Depression with anxiety   . Dysmenorrhea   . Gastroparesis   . GERD (gastroesophageal reflux disease)   . Hx of low back pain   . IBS (irritable bowel syndrome)   . Migraine   . Migraine without aura, with intractable migraine, so stated, without mention of status migrainosus 05/20/2013  . Osteopenia     PAST SURGICAL HISTORY: Past Surgical History:  Procedure Laterality Date  . ABDOMINAL SURGERY    . BREAST BIOPSY Right    x2  . BREAST CYST ASPIRATION  1999  . COLONOSCOPY    . FOOT SURGERY Left    Bunionectomy    . MASS EXCISION  08/08/2011   Procedure: MINOR EXCISION OF MASS;  Surgeon: Cammie Sickle., MD;  Location: [facility];  Service: Orthopedics;  Laterality: Left;  left ring excision cyst proximal phalangeal joint  . NISSEN FUNDOPLICATION    . ROTATOR CUFF REPAIR Right 05/2015  . TONSILLECTOMY      FAMILY HISTORY: Family History  Problem Relation Age of Onset  . Stroke Father   . Dementia Father   . Atrial fibrillation Father   . Cancer Father   . Hypertension Mother   . Scoliosis Mother   . Heart disease Sister        Congenital heart disease  . Migraines Neg Hx     SOCIAL HISTORY: Social History   Socioeconomic History  . Marital status: Married    Spouse name: Not on file  . Number of children: 0  . Years of education: college  . Highest education level: Not on file  Occupational History    Employer: [employer]  Tobacco Use  . Smoking status: Former Research scientist (life sciences)  . Smokeless tobacco: Never Used  . Tobacco comment: IN college 1 year  Substance and Sexual Activity  . Alcohol use: Yes    Alcohol/week: 0.0 standard drinks    Comment: wine occasionally  . Drug use: No  . Sexual activity: Yes    Birth control/protection: Post-menopausal  Other Topics Concern  . Not on file  Social History Narrative   Patient lives at home with her husband Hoy Morn).   Patient is retired.   Education college.   Right handed.    Caffeine one cup daily.   Social Determinants of Health   Financial Resource Strain:   . Difficulty of Paying Living Expenses: Not on file  Food Insecurity:   . Worried About Charity fundraiser in the Last Year: Not on file  . Ran Out of Food in the Last Year: Not on file  Transportation Needs:   . Lack of Transportation (Medical): Not on file  . Lack of Transportation (Non-Medical): Not on file  Physical Activity:   . Days of Exercise per Week: Not on file  . Minutes of Exercise per Session: Not on file   Stress:   . Feeling of Stress : Not on file  Social Connections:   . Frequency of Communication with Friends and Family: Not on file  . Frequency of Social Gatherings with Friends and Family: Not on file  . Attends Religious Services: Not on file  . Active Member of Clubs or Organizations: Not on file  . Attends Archivist Meetings: Not on file  . Marital Status: Not on file  Intimate Partner Violence:   . Fear of Current or Ex-Partner: Not on file  . Emotionally Abused: Not on file  . Physically Abused: Not on file  . Sexually Abused: Not on file      PHYSICAL EXAM  Vitals:   06/02/19 1035  BP: 101/64  Pulse: 81  Temp: (!) 97.2 F (36.2 C)  Weight: 168 lb 9.6 oz (76.5 kg)  Height: 5' 10.5" (1.791 m)   Body mass index is 23.85 kg/m.  Generalized: Well developed, in no acute distress   Neurological examination  Mentation: Alert oriented to time, place, history taking. Follows all commands speech and language fluent Cranial nerve II-XII: Pupils were equal round reactive to light. Extraocular movements were full, visual field were full on confrontational test.Head turning and shoulder shrug  were normal and symmetric. Motor: The motor testing reveals 5 over 5 strength of all 4 extremities. Good symmetric motor tone is noted throughout.  Sensory: Sensory testing is intact to soft touch on all 4 extremities. No evidence of extinction is noted.  Coordination: Cerebellar testing reveals good finger-nose-finger and heel-to-shin bilaterally.  Gait and station: Gait is normal. Tandem gait is normal. Romberg is negative. No drift is seen.  Reflexes: Deep tendon reflexes are symmetric and normal bilaterally.   DIAGNOSTIC DATA (LABS, IMAGING, TESTING) - I reviewed patient records, labs, notes, testing and imaging myself where available.  Lab Results  Component Value Date   WBC 26.3 (H) 05/01/2019   HGB 14.3 05/01/2019   HCT 42.7 05/01/2019   MCV 91.8 05/01/2019    PLT 477 (H) 05/01/2019      Component Value Date/Time   NA 136 05/01/2019 1049   K 4.1 05/01/2019 1049   CL 106 05/01/2019 1049   CO2 21 (L) 05/01/2019 1049   GLUCOSE 98 05/01/2019 1049   BUN 34 (H) 05/01/2019 1049   CREATININE 1.02 (H) 05/01/2019 1049   CALCIUM 8.6 (L) 05/01/2019 1049   PROT 6.5 05/01/2019 1049   ALBUMIN 3.3 (L) 05/01/2019 1049   AST 45 (H) 05/01/2019 1049   ALT 79 (H) 05/01/2019 1049   ALKPHOS 72 05/01/2019 1049   BILITOT 0.5 05/01/2019 1049   GFRNONAA 57 (L) 05/01/2019 1049   GFRAA >60 05/01/2019 1049   No results found for: CHOL, HDL, LDLCALC, LDLDIRECT, TRIG, CHOLHDL No results found for: HGBA1C Lab Results  Component Value Date   VITAMINB12 597 12/25/2017   Lab Results  Component Value Date   TSH 1.138 10/01/2017      ASSESSMENT AND PLAN 69 y.o. year old female  has a past medical  history of Abscessed tooth, Allergic rhinitis, Asthma, Asthma, Celiac disease, Depression with anxiety, Dysmenorrhea, Gastroparesis, GERD (gastroesophageal reflux disease), low back pain, IBS (irritable bowel syndrome), Migraine, Migraine without aura, with intractable migraine, so stated, without mention of status migrainosus (05/20/2013), and Osteopenia. here with:  1.  Migraine headaches  -Continue Zonegran 100 mg twice a day -If headache frequency does not improve we discussed increasing Zonegran. -Continue Maxalt as an abortive therapy.  Cautioned the patient that using Maxalt continuously can lead to rebound headaches.  2.  Paresthesias in the lower extremities  -Continue to monitor -Denies discomfort only numbness -Tried and failed gabapentin   Advised if symptoms worsen or she develops new symptoms she should let us know.  Follow-up in 6 months or sooner if needed.      Ward Givens, MSN, NP-C 06/02/2019, 11:09 AM [facility] [address] [place], [place] [address] [phone]

## 2019-06-04 DIAGNOSIS — J309 Allergic rhinitis, unspecified: Secondary | ICD-10-CM | POA: Diagnosis not present

## 2019-06-04 NOTE — Progress Notes (Signed)
I have read the note, and I agree with the clinical assessment and plan.  Jayzen Paver K Katelyn Broadnax   

## 2019-06-12 DIAGNOSIS — N3010 Interstitial cystitis (chronic) without hematuria: Secondary | ICD-10-CM | POA: Diagnosis not present

## 2019-06-12 DIAGNOSIS — R102 Pelvic and perineal pain: Secondary | ICD-10-CM | POA: Diagnosis not present

## 2019-06-13 ENCOUNTER — Other Ambulatory Visit: Payer: Self-pay

## 2019-06-13 DIAGNOSIS — L570 Actinic keratosis: Secondary | ICD-10-CM | POA: Diagnosis not present

## 2019-06-13 DIAGNOSIS — D485 Neoplasm of uncertain behavior of skin: Secondary | ICD-10-CM | POA: Diagnosis not present

## 2019-06-13 DIAGNOSIS — L858 Other specified epidermal thickening: Secondary | ICD-10-CM | POA: Diagnosis not present

## 2019-06-13 DIAGNOSIS — L820 Inflamed seborrheic keratosis: Secondary | ICD-10-CM | POA: Diagnosis not present

## 2019-06-13 DIAGNOSIS — L432 Lichenoid drug reaction: Secondary | ICD-10-CM | POA: Diagnosis not present

## 2019-06-17 DIAGNOSIS — J309 Allergic rhinitis, unspecified: Secondary | ICD-10-CM | POA: Diagnosis not present

## 2019-06-18 DIAGNOSIS — E7800 Pure hypercholesterolemia, unspecified: Secondary | ICD-10-CM | POA: Diagnosis not present

## 2019-06-24 DIAGNOSIS — M722 Plantar fascial fibromatosis: Secondary | ICD-10-CM | POA: Diagnosis not present

## 2019-06-24 DIAGNOSIS — M79671 Pain in right foot: Secondary | ICD-10-CM | POA: Diagnosis not present

## 2019-06-25 DIAGNOSIS — M6281 Muscle weakness (generalized): Secondary | ICD-10-CM | POA: Diagnosis not present

## 2019-06-25 DIAGNOSIS — R0981 Nasal congestion: Secondary | ICD-10-CM | POA: Diagnosis not present

## 2019-06-25 DIAGNOSIS — M25571 Pain in right ankle and joints of right foot: Secondary | ICD-10-CM | POA: Diagnosis not present

## 2019-06-30 ENCOUNTER — Other Ambulatory Visit: Payer: Self-pay | Admitting: Urology

## 2019-06-30 DIAGNOSIS — J309 Allergic rhinitis, unspecified: Secondary | ICD-10-CM | POA: Diagnosis not present

## 2019-07-01 DIAGNOSIS — M25571 Pain in right ankle and joints of right foot: Secondary | ICD-10-CM | POA: Diagnosis not present

## 2019-07-01 DIAGNOSIS — E7800 Pure hypercholesterolemia, unspecified: Secondary | ICD-10-CM | POA: Diagnosis not present

## 2019-07-01 DIAGNOSIS — M6281 Muscle weakness (generalized): Secondary | ICD-10-CM | POA: Diagnosis not present

## 2019-07-03 DIAGNOSIS — M6281 Muscle weakness (generalized): Secondary | ICD-10-CM | POA: Diagnosis not present

## 2019-07-03 DIAGNOSIS — M25571 Pain in right ankle and joints of right foot: Secondary | ICD-10-CM | POA: Diagnosis not present

## 2019-07-14 DIAGNOSIS — M6281 Muscle weakness (generalized): Secondary | ICD-10-CM | POA: Diagnosis not present

## 2019-07-14 DIAGNOSIS — M25571 Pain in right ankle and joints of right foot: Secondary | ICD-10-CM | POA: Diagnosis not present

## 2019-07-15 DIAGNOSIS — J309 Allergic rhinitis, unspecified: Secondary | ICD-10-CM | POA: Diagnosis not present

## 2019-07-17 DIAGNOSIS — M25571 Pain in right ankle and joints of right foot: Secondary | ICD-10-CM | POA: Diagnosis not present

## 2019-07-17 DIAGNOSIS — M6281 Muscle weakness (generalized): Secondary | ICD-10-CM | POA: Diagnosis not present

## 2019-07-21 DIAGNOSIS — M25571 Pain in right ankle and joints of right foot: Secondary | ICD-10-CM | POA: Diagnosis not present

## 2019-07-21 DIAGNOSIS — M6281 Muscle weakness (generalized): Secondary | ICD-10-CM | POA: Diagnosis not present

## 2019-07-24 DIAGNOSIS — M25571 Pain in right ankle and joints of right foot: Secondary | ICD-10-CM | POA: Diagnosis not present

## 2019-07-24 DIAGNOSIS — M6281 Muscle weakness (generalized): Secondary | ICD-10-CM | POA: Diagnosis not present

## 2019-07-25 ENCOUNTER — Ambulatory Visit (INDEPENDENT_AMBULATORY_CARE_PROVIDER_SITE_OTHER): Payer: Medicare Other | Admitting: Physician Assistant

## 2019-07-25 ENCOUNTER — Encounter: Payer: Self-pay | Admitting: Physician Assistant

## 2019-07-25 ENCOUNTER — Other Ambulatory Visit: Payer: Self-pay

## 2019-07-25 DIAGNOSIS — L280 Lichen simplex chronicus: Secondary | ICD-10-CM | POA: Diagnosis not present

## 2019-07-25 DIAGNOSIS — L720 Epidermal cyst: Secondary | ICD-10-CM | POA: Diagnosis not present

## 2019-07-25 MED ORDER — IMPOYZ 0.025 % EX CREA: 1.0000 "application " | TOPICAL_CREAM | Freq: Every day | CUTANEOUS | 0 refills | Status: DC

## 2019-07-25 NOTE — Progress Notes (Signed)
   Follow up Visit  Subjective  Katelyn Mcclain is a 69 y.o. female who presents for the following: Follow-up (Here for a 6 week follow-up to recheck places that were biopsied at last visit; left deltoid, mid upper chest and right, posterior bend of knee.  All places were benign.  States that they all healed well. Left deltoid is a little red still.) and Milia (Also, here for milia extraction on forehead.  Has been using the retinol (OTC).)All lesions biopsied in March were benign. She feels all frozen areas did well. We will recheck Aks near the right brow and above the left upper lip.    Objective  Well appearing patient in no apparent distress; mood and affect are within normal limits.  A focused examination was performed including face, left shoulder.. Relevant physical exam findings are noted in the Assessment and Plan. AK near right brow and above left upper lip appear clear.   Objective  Left Shoulder - Anterior: Biopsied at last office visit on 06/13/2019. Samples of Impoyz given.  Objective  Head - Anterior (Face): Tiny white papules forehead and right lower eyelid.  Assessment & Plan  Lichenoid dermatitis Left Shoulder - Anterior  Clobetasol Propionate (IMPOYZ) 0.025 % CREA - Left Shoulder - Anterior  Milia Head - Anterior (Face)  Extractions attempted. Continue retinol OTC.

## 2019-07-28 DIAGNOSIS — M25571 Pain in right ankle and joints of right foot: Secondary | ICD-10-CM | POA: Diagnosis not present

## 2019-07-28 DIAGNOSIS — M6281 Muscle weakness (generalized): Secondary | ICD-10-CM | POA: Diagnosis not present

## 2019-07-28 DIAGNOSIS — R0981 Nasal congestion: Secondary | ICD-10-CM | POA: Diagnosis not present

## 2019-07-28 DIAGNOSIS — J309 Allergic rhinitis, unspecified: Secondary | ICD-10-CM | POA: Diagnosis not present

## 2019-07-31 DIAGNOSIS — M25571 Pain in right ankle and joints of right foot: Secondary | ICD-10-CM | POA: Diagnosis not present

## 2019-07-31 DIAGNOSIS — M6281 Muscle weakness (generalized): Secondary | ICD-10-CM | POA: Diagnosis not present

## 2019-08-04 DIAGNOSIS — M25571 Pain in right ankle and joints of right foot: Secondary | ICD-10-CM | POA: Diagnosis not present

## 2019-08-04 DIAGNOSIS — M6281 Muscle weakness (generalized): Secondary | ICD-10-CM | POA: Diagnosis not present

## 2019-08-05 DIAGNOSIS — J309 Allergic rhinitis, unspecified: Secondary | ICD-10-CM | POA: Diagnosis not present

## 2019-08-07 DIAGNOSIS — M6281 Muscle weakness (generalized): Secondary | ICD-10-CM | POA: Diagnosis not present

## 2019-08-07 DIAGNOSIS — M25571 Pain in right ankle and joints of right foot: Secondary | ICD-10-CM | POA: Diagnosis not present

## 2019-08-25 DIAGNOSIS — J309 Allergic rhinitis, unspecified: Secondary | ICD-10-CM | POA: Diagnosis not present

## 2019-08-29 DIAGNOSIS — R0981 Nasal congestion: Secondary | ICD-10-CM | POA: Diagnosis not present

## 2019-09-01 DIAGNOSIS — J690 Pneumonitis due to inhalation of food and vomit: Secondary | ICD-10-CM | POA: Diagnosis not present

## 2019-09-01 DIAGNOSIS — R05 Cough: Secondary | ICD-10-CM | POA: Diagnosis not present

## 2019-09-01 DIAGNOSIS — J45909 Unspecified asthma, uncomplicated: Secondary | ICD-10-CM | POA: Diagnosis not present

## 2019-09-02 DIAGNOSIS — J309 Allergic rhinitis, unspecified: Secondary | ICD-10-CM | POA: Diagnosis not present

## 2019-09-05 DIAGNOSIS — J189 Pneumonia, unspecified organism: Secondary | ICD-10-CM | POA: Diagnosis not present

## 2019-09-05 DIAGNOSIS — R05 Cough: Secondary | ICD-10-CM | POA: Diagnosis not present

## 2019-09-09 DIAGNOSIS — R399 Unspecified symptoms and signs involving the genitourinary system: Secondary | ICD-10-CM | POA: Diagnosis not present

## 2019-09-09 DIAGNOSIS — J309 Allergic rhinitis, unspecified: Secondary | ICD-10-CM | POA: Diagnosis not present

## 2019-09-22 DIAGNOSIS — J189 Pneumonia, unspecified organism: Secondary | ICD-10-CM | POA: Diagnosis not present

## 2019-09-22 DIAGNOSIS — G43909 Migraine, unspecified, not intractable, without status migrainosus: Secondary | ICD-10-CM | POA: Diagnosis not present

## 2019-09-22 DIAGNOSIS — K900 Celiac disease: Secondary | ICD-10-CM | POA: Diagnosis not present

## 2019-09-22 DIAGNOSIS — K580 Irritable bowel syndrome with diarrhea: Secondary | ICD-10-CM | POA: Diagnosis not present

## 2019-09-22 DIAGNOSIS — J45909 Unspecified asthma, uncomplicated: Secondary | ICD-10-CM | POA: Diagnosis not present

## 2019-09-22 DIAGNOSIS — N3010 Interstitial cystitis (chronic) without hematuria: Secondary | ICD-10-CM | POA: Diagnosis not present

## 2019-09-22 DIAGNOSIS — J984 Other disorders of lung: Secondary | ICD-10-CM | POA: Diagnosis not present

## 2019-09-22 DIAGNOSIS — M549 Dorsalgia, unspecified: Secondary | ICD-10-CM | POA: Diagnosis not present

## 2019-09-22 DIAGNOSIS — J309 Allergic rhinitis, unspecified: Secondary | ICD-10-CM | POA: Diagnosis not present

## 2019-09-22 DIAGNOSIS — E7800 Pure hypercholesterolemia, unspecified: Secondary | ICD-10-CM | POA: Diagnosis not present

## 2019-09-22 DIAGNOSIS — N3281 Overactive bladder: Secondary | ICD-10-CM | POA: Diagnosis not present

## 2019-09-22 DIAGNOSIS — R110 Nausea: Secondary | ICD-10-CM | POA: Diagnosis not present

## 2019-09-22 DIAGNOSIS — K219 Gastro-esophageal reflux disease without esophagitis: Secondary | ICD-10-CM | POA: Diagnosis not present

## 2019-09-22 DIAGNOSIS — J690 Pneumonitis due to inhalation of food and vomit: Secondary | ICD-10-CM | POA: Diagnosis not present

## 2019-09-22 DIAGNOSIS — R05 Cough: Secondary | ICD-10-CM | POA: Diagnosis not present

## 2019-10-12 ENCOUNTER — Other Ambulatory Visit: Payer: Self-pay | Admitting: Adult Health

## 2019-10-20 DIAGNOSIS — J309 Allergic rhinitis, unspecified: Secondary | ICD-10-CM | POA: Diagnosis not present

## 2019-10-28 DIAGNOSIS — J309 Allergic rhinitis, unspecified: Secondary | ICD-10-CM | POA: Diagnosis not present

## 2019-11-03 ENCOUNTER — Other Ambulatory Visit: Payer: Self-pay | Admitting: Internal Medicine

## 2019-11-03 DIAGNOSIS — J309 Allergic rhinitis, unspecified: Secondary | ICD-10-CM | POA: Diagnosis not present

## 2019-11-03 DIAGNOSIS — Z1231 Encounter for screening mammogram for malignant neoplasm of breast: Secondary | ICD-10-CM

## 2019-11-11 DIAGNOSIS — J309 Allergic rhinitis, unspecified: Secondary | ICD-10-CM | POA: Diagnosis not present

## 2019-11-17 DIAGNOSIS — D72829 Elevated white blood cell count, unspecified: Secondary | ICD-10-CM | POA: Diagnosis not present

## 2019-11-17 DIAGNOSIS — E7800 Pure hypercholesterolemia, unspecified: Secondary | ICD-10-CM | POA: Diagnosis not present

## 2019-11-24 DIAGNOSIS — J309 Allergic rhinitis, unspecified: Secondary | ICD-10-CM | POA: Diagnosis not present

## 2019-11-25 ENCOUNTER — Other Ambulatory Visit: Payer: Self-pay | Admitting: Adult Health

## 2019-11-25 ENCOUNTER — Encounter: Payer: Self-pay | Admitting: *Deleted

## 2019-11-25 MED ORDER — ZONISAMIDE 100 MG PO CAPS: 100.0000 mg | ORAL_CAPSULE | Freq: Two times a day (BID) | ORAL | 0 refills | Status: DC

## 2019-11-25 NOTE — Telephone Encounter (Signed)
Pt is needing a refill for her zonisamide (ZONEGRAN) 100 MG capsule sent in to the [facility]

## 2019-11-25 NOTE — Telephone Encounter (Signed)
Zonisamide refilled x 3 months to [facility]. Patient has FU soon.

## 2019-11-26 DIAGNOSIS — G4700 Insomnia, unspecified: Secondary | ICD-10-CM | POA: Diagnosis not present

## 2019-11-26 DIAGNOSIS — G43909 Migraine, unspecified, not intractable, without status migrainosus: Secondary | ICD-10-CM | POA: Diagnosis not present

## 2019-11-26 DIAGNOSIS — N3010 Interstitial cystitis (chronic) without hematuria: Secondary | ICD-10-CM | POA: Diagnosis not present

## 2019-11-26 DIAGNOSIS — F329 Major depressive disorder, single episode, unspecified: Secondary | ICD-10-CM | POA: Diagnosis not present

## 2019-11-26 DIAGNOSIS — F418 Other specified anxiety disorders: Secondary | ICD-10-CM | POA: Diagnosis not present

## 2019-11-26 DIAGNOSIS — G629 Polyneuropathy, unspecified: Secondary | ICD-10-CM | POA: Diagnosis not present

## 2019-11-26 DIAGNOSIS — K219 Gastro-esophageal reflux disease without esophagitis: Secondary | ICD-10-CM | POA: Diagnosis not present

## 2019-11-26 DIAGNOSIS — K580 Irritable bowel syndrome with diarrhea: Secondary | ICD-10-CM | POA: Diagnosis not present

## 2019-11-26 DIAGNOSIS — K900 Celiac disease: Secondary | ICD-10-CM | POA: Diagnosis not present

## 2019-11-26 DIAGNOSIS — E7800 Pure hypercholesterolemia, unspecified: Secondary | ICD-10-CM | POA: Diagnosis not present

## 2019-11-26 DIAGNOSIS — F419 Anxiety disorder, unspecified: Secondary | ICD-10-CM | POA: Diagnosis not present

## 2019-11-26 DIAGNOSIS — J45909 Unspecified asthma, uncomplicated: Secondary | ICD-10-CM | POA: Diagnosis not present

## 2019-11-28 ENCOUNTER — Ambulatory Visit
Admission: RE | Admit: 2019-11-28 | Discharge: 2019-11-28 | Disposition: A | Discharge status: Home / Self Care | Payer: Medicare Other | Source: Ambulatory Visit | Attending: Internal Medicine | Admitting: Internal Medicine

## 2019-11-28 ENCOUNTER — Other Ambulatory Visit: Payer: Self-pay

## 2019-11-28 DIAGNOSIS — Z1231 Encounter for screening mammogram for malignant neoplasm of breast: Secondary | ICD-10-CM | POA: Diagnosis not present

## 2019-12-01 ENCOUNTER — Encounter: Payer: Self-pay | Admitting: Adult Health

## 2019-12-01 ENCOUNTER — Ambulatory Visit (INDEPENDENT_AMBULATORY_CARE_PROVIDER_SITE_OTHER): Payer: Medicare Other | Admitting: Adult Health

## 2019-12-01 VITALS — BP 109/70 | HR 61 | Ht 70.0 in | Wt 166.0 lb

## 2019-12-01 DIAGNOSIS — G43009 Migraine without aura, not intractable, without status migrainosus: Secondary | ICD-10-CM

## 2019-12-01 DIAGNOSIS — N3010 Interstitial cystitis (chronic) without hematuria: Secondary | ICD-10-CM | POA: Diagnosis not present

## 2019-12-01 DIAGNOSIS — N3281 Overactive bladder: Secondary | ICD-10-CM | POA: Diagnosis not present

## 2019-12-01 DIAGNOSIS — R202 Paresthesia of skin: Secondary | ICD-10-CM

## 2019-12-01 NOTE — Patient Instructions (Signed)
Your Plan:  Continue zonegran 100 mg twice a day Continue maxalt If your symptoms worsen or you develop new symptoms please let us know.       Thank you for coming to see [place] at [facility]. I hope we have been able to provide you high quality care today.  You may receive a patient satisfaction survey over the next few weeks. We would appreciate your feedback and comments so that we may continue to improve ourselves and the health of our patients.

## 2019-12-01 NOTE — Progress Notes (Signed)
PATIENT: Katelyn Mcclain DOB: 13-Dec-1950  REASON FOR VISIT: follow up HISTORY FROM: patient  HISTORY OF PRESENT ILLNESS: Today 12/01/19:  Katelyn Mcclain is a 69 year old female with a history of migraine headaches and paresthesias in the lower extremities.  She returns today for follow-up.  She states that her headaches have remained relatively stable.  She has approximately 1 headache a month.  Triggers her barometric pressure.  She does state that she travel to the [place] and this made her headaches worse.  She also has a history of Mnire's disease.  She states that Maxalt is beneficial for her headaches.  She continues to have numbness in the lower extremities.  Tried and failed gabapentin.  No changes in her gait or balance.  HISTORY 06/02/19:  Katelyn Mcclain is a 69 year old female with a history of migraine headaches and paresthesias in the lower extremities.  She returns today for follow-up.  She reports that her headaches have increased in the last month or so.  She feels that this is related to the weather.  She states that she is using more Maxalt.  Approximately 2 to 4 tablets a week.  She reduced her Zonegran dose to 100 mg twice a day.  Ultimately she would like to continue to reduce the dose.  She continues to have numbness in the toes.  Nerve conduction studies did reveal a early neuropathy.  She denies any discomfort.  Denies any changes with her gait or balance.  She tried gabapentin but did not like this medication.  She returns today for evaluation  REVIEW OF SYSTEMS: Out of a complete 14 system review of symptoms, the patient complains only of the following symptoms, and all other reviewed systems are negative.  See HPI  ALLERGIES: Allergies  Allergen Reactions  . Adhesive [Tape] Other (See Comments)    Tear's skin off   . Morphine And Related Other (See Comments)    Sensitive to narcotics, worsening migraines.  . Barley Grass Other (See Comments)    Celiac  disease   Also rye  . Clonazepam Other (See Comments)    Caused anxiety  . Linaclotide Other (See Comments)    Caused spastic bladder (reaction to Linzess)  . Metronidazole Other (See Comments)    Black tongue and diarrhea  . Oatmeal Other (See Comments)    Celiac disease  . Other     Pt does not use artificial sweeteners   . Wheat Bran Other (See Comments)    Celiac disease    HOME MEDICATIONS: Outpatient Medications Prior to Visit  Medication Sig Dispense Refill  . albuterol (PROVENTIL HFA;VENTOLIN HFA) 108 (90 BASE) MCG/ACT inhaler Inhale 1 puff into the lungs every 6 (six) hours as needed for wheezing or shortness of breath (seasonal allergies).     Marland Kitchen albuterol (PROVENTIL) (2.5 MG/3ML) 0.083% nebulizer solution Take 3 mLs (2.5 mg total) by nebulization every 6 (six) hours as needed for wheezing or shortness of breath. 75 mL 0  . b complex vitamins capsule Take by mouth.    . benzonatate (TESSALON) 100 MG capsule Take 1 capsule (100 mg total) by mouth 3 (three) times daily as needed for cough. 21 capsule 0  . Calcium-Magnesium-Vitamin D (CALCIUM 1200+D3 PO) Take 1,200 mg by mouth daily.    . Clobetasol Propionate (IMPOYZ) 0.025 % CREA Apply 1 application topically daily. 60 g 0  . conjugated estrogens (PREMARIN) vaginal cream Premarin 0.625 mg/gram vaginal cream  INSERT 0.5 APPLICATOR(S)FUL EVERY DAY BY VAGINAL ROUTE.    Marland Kitchen  docusate sodium (COLACE) 100 MG capsule Take 100 mg by mouth 2 (two) times daily.    . Eszopiclone (ESZOPICLONE) 3 MG TABS Take 1.5 mg by mouth at bedtime as needed (sleep). Take immediately before bedtime     . FLUAD 0.5 ML SUSY TO BE ADMINISTERED BY PHARMACIST FOR IMMUNIZATION    . Garlic (GARLIQUE PO) Take 1 capsule by mouth daily.     . Meth-Hyo-M Bl-Na Phos-Ph Sal (URO-MP) 118 MG CAPS TAKE 1 CAPSULE BY MOUTH TWICE A DAY AS NEEDED 30 capsule 5  . montelukast (SINGULAIR) 10 MG tablet Take 10 mg by mouth at bedtime.     Marland Kitchen MYRBETRIQ 25 MG TB24 tablet     .  MYRBETRIQ 50 MG TB24 tablet TAKE 1 TABLET DAILY 90 tablet 3  . omeprazole (PRILOSEC) 40 MG capsule Take 40 mg by mouth daily.    . ondansetron (ZOFRAN) 4 MG tablet TAKE 1 TABLET BY MOUTH EVERY 6 HOURS AS NEEDED FOR NAUSEA - IF PHERERGAN NOT WORKING  2  . OVER THE COUNTER MEDICATION Take 500 mg by mouth daily. Vitamin B5    . pravastatin (PRAVACHOL) 10 MG tablet Take 10 mg by mouth daily.    . Probiotic Product (PROBIOTIC DAILY PO) Take 1 tablet by mouth 2 (two) times daily.     . promethazine (PHENERGAN) 25 MG tablet Take 1 tablet (25 mg total) by mouth every 6 (six) hours as needed for nausea or vomiting. 10 tablet 0  . pyridoxine (B-6) 200 MG tablet Take 200 mg by mouth daily.    . rizatriptan (MAXALT-MLT) 10 MG disintegrating tablet TAKE 1 TABLET AT THE ONSET OF MIGRAINE AND MAY REPEAT IN 2 HOURS IF NEEDED. NOT TO EXCEED 2 TABLETS IN 24 HOURS 27 tablet 3  . vortioxetine HBr (TRINTELLIX) 20 MG TABS Take 20 mg by mouth at bedtime.    Marland Kitchen zonisamide (ZONEGRAN) 100 MG capsule Take 1 capsule (100 mg total) by mouth 2 (two) times daily. 180 capsule 0  . Omega-3 1000 MG CAPS Take by mouth.     Facility-Administered Medications Prior to Visit  Medication Dose Route Frequency Provider Last Rate Last Admin  . ipratropium (ATROVENT) nebulizer solution 0.5 mg  0.5 mg Nebulization Once Rise Mu, PA-C        PAST MEDICAL HISTORY: Past Medical History:  Diagnosis Date  . Abscessed tooth   . Allergic rhinitis   . Asthma   . Asthma    inhaler prn  . Celiac disease   . Depression with anxiety   . Dysmenorrhea   . Gastroparesis   . GERD (gastroesophageal reflux disease)   . Hx of low back pain   . IBS (irritable bowel syndrome)   . Migraine   . Migraine without aura, with intractable migraine, so stated, without mention of status migrainosus 05/20/2013  . Osteopenia     PAST SURGICAL HISTORY: Past Surgical History:  Procedure Laterality Date  . ABDOMINAL SURGERY    . BREAST BIOPSY Right      x2  . BREAST CYST ASPIRATION  1999  . COLONOSCOPY    . FOOT SURGERY Left    Bunionectomy  . MASS EXCISION  08/08/2011   Procedure: MINOR EXCISION OF MASS;  Surgeon: Cammie Sickle., MD;  Location: [facility];  Service: Orthopedics;  Laterality: Left;  left ring excision cyst proximal phalangeal joint  . NISSEN FUNDOPLICATION    . ROTATOR CUFF REPAIR Right 05/2015  . TONSILLECTOMY  FAMILY HISTORY: Family History  Problem Relation Age of Onset  . Stroke Father   . Dementia Father   . Atrial fibrillation Father   . Cancer Father   . Hypertension Mother   . Scoliosis Mother   . Heart disease Sister        Congenital heart disease  . Breast cancer Maternal Grandmother   . Migraines Neg Hx     SOCIAL HISTORY: Social History   Socioeconomic History  . Marital status: Married    Spouse name: Not on file  . Number of children: 0  . Years of education: college  . Highest education level: Not on file  Occupational History    Employer: [employer]  Tobacco Use  . Smoking status: Former Research scientist (life sciences)  . Smokeless tobacco: Never Used  . Tobacco comment: IN college 1 year  Vaping Use  . Vaping Use: Never used  Substance and Sexual Activity  . Alcohol use: Yes    Alcohol/week: 0.0 standard drinks    Comment: wine occasionally  . Drug use: No  . Sexual activity: Yes    Birth control/protection: Post-menopausal  Other Topics Concern  . Not on file  Social History Narrative   Patient lives at home with her husband Hoy Morn).   Patient is retired.   Education college.   Right handed.    Caffeine one cup daily.   Social Determinants of Health   Financial Resource Strain:   . Difficulty of Paying Living Expenses: Not on file  Food Insecurity:   . Worried About Charity fundraiser in the Last Year: Not on file  . Ran Out of Food in the Last Year: Not on file  Transportation Needs:   . Lack of Transportation (Medical): Not on  file  . Lack of Transportation (Non-Medical): Not on file  Physical Activity:   . Days of Exercise per Week: Not on file  . Minutes of Exercise per Session: Not on file  Stress:   . Feeling of Stress : Not on file  Social Connections:   . Frequency of Communication with Friends and Family: Not on file  . Frequency of Social Gatherings with Friends and Family: Not on file  . Attends Religious Services: Not on file  . Active Member of Clubs or Organizations: Not on file  . Attends Archivist Meetings: Not on file  . Marital Status: Not on file  Intimate Partner Violence:   . Fear of Current or Ex-Partner: Not on file  . Emotionally Abused: Not on file  . Physically Abused: Not on file  . Sexually Abused: Not on file      PHYSICAL EXAM  Vitals:   12/01/19 1314  BP: 109/70  Pulse: 61  Weight: 166 lb (75.3 kg)  Height: 5' 10"  (1.778 m)   Body mass index is 23.82 kg/m.  Generalized: Well developed, in no acute distress   Neurological examination  Mentation: Alert oriented to time, place, history taking. Follows all commands speech and language fluent Cranial nerve II-XII:  Extraocular movements were full, visual field were full on confrontational test.  Head turning and shoulder shrug  were normal and symmetric. Motor: The motor testing reveals 5 over 5 strength of all 4 extremities. Good symmetric motor tone is noted throughout.  Sensory: Sensory testing is intact to soft touch on all 4 extremities. No evidence of extinction is noted.  Coordination: Cerebellar testing reveals good finger-nose-finger and heel-to-shin bilaterally.  Gait and station: Gait is normal.  Reflexes: Deep tendon reflexes are symmetric and normal bilaterally.   DIAGNOSTIC DATA (LABS, IMAGING, TESTING) - I reviewed patient records, labs, notes, testing and imaging myself where available.  Lab Results  Component Value Date   WBC 26.3 (H) 05/01/2019   HGB 14.3 05/01/2019   HCT 42.7  05/01/2019   MCV 91.8 05/01/2019   PLT 477 (H) 05/01/2019      Component Value Date/Time   NA 136 05/01/2019 1049   K 4.1 05/01/2019 1049   CL 106 05/01/2019 1049   CO2 21 (L) 05/01/2019 1049   GLUCOSE 98 05/01/2019 1049   BUN 34 (H) 05/01/2019 1049   CREATININE 1.02 (H) 05/01/2019 1049   CALCIUM 8.6 (L) 05/01/2019 1049   PROT 6.5 05/01/2019 1049   ALBUMIN 3.3 (L) 05/01/2019 1049   AST 45 (H) 05/01/2019 1049   ALT 79 (H) 05/01/2019 1049   ALKPHOS 72 05/01/2019 1049   BILITOT 0.5 05/01/2019 1049   GFRNONAA 57 (L) 05/01/2019 1049   GFRAA >60 05/01/2019 1049    Lab Results  Component Value Date   [id number] 497 12/25/2017   Lab Results  Component Value Date   TSH 1.138 10/01/2017      ASSESSMENT AND PLAN 69 y.o. year old female  has a past medical history of Abscessed tooth, Allergic rhinitis, Asthma, Asthma, Celiac disease, Depression with anxiety, Dysmenorrhea, Gastroparesis, GERD (gastroesophageal reflux disease), low back pain, IBS (irritable bowel syndrome), Migraine, Migraine without aura, with intractable migraine, so stated, without mention of status migrainosus (05/20/2013), and Osteopenia. here with:  1.  Migraine headaches   Continue Zonegran 100 mg twice a day for preventative therapy  Continue Maxalt for abortive therapy  2.  Paresthesias in the lower extremities   Continue to monitor  Tried and failed gabapentin  Advised that her symptoms worsen or she develops new symptoms she should let us know Follow-up in 6 months or sooner if needed   I spent 20 minutes of face-to-face and non-face-to-face time with patient.  This included previsit chart review, lab review, study review, order entry, electronic health record documentation, patient education.  Ward Givens, MSN, NP-C 12/01/2019, 1:37 PM [facility] [address] [place], [place] [address] [phone]

## 2019-12-02 DIAGNOSIS — J309 Allergic rhinitis, unspecified: Secondary | ICD-10-CM | POA: Diagnosis not present

## 2019-12-09 DIAGNOSIS — J309 Allergic rhinitis, unspecified: Secondary | ICD-10-CM | POA: Diagnosis not present

## 2019-12-09 DIAGNOSIS — R102 Pelvic and perineal pain: Secondary | ICD-10-CM | POA: Diagnosis not present

## 2019-12-09 DIAGNOSIS — N3281 Overactive bladder: Secondary | ICD-10-CM | POA: Diagnosis not present

## 2019-12-09 DIAGNOSIS — N3010 Interstitial cystitis (chronic) without hematuria: Secondary | ICD-10-CM | POA: Diagnosis not present

## 2019-12-10 DIAGNOSIS — K900 Celiac disease: Secondary | ICD-10-CM | POA: Diagnosis not present

## 2019-12-10 DIAGNOSIS — Z79899 Other long term (current) drug therapy: Secondary | ICD-10-CM | POA: Diagnosis not present

## 2019-12-10 DIAGNOSIS — K580 Irritable bowel syndrome with diarrhea: Secondary | ICD-10-CM | POA: Diagnosis not present

## 2019-12-10 DIAGNOSIS — K6389 Other specified diseases of intestine: Secondary | ICD-10-CM | POA: Diagnosis not present

## 2019-12-10 DIAGNOSIS — N3010 Interstitial cystitis (chronic) without hematuria: Secondary | ICD-10-CM | POA: Diagnosis not present

## 2019-12-10 DIAGNOSIS — K3184 Gastroparesis: Secondary | ICD-10-CM | POA: Diagnosis not present

## 2019-12-12 DIAGNOSIS — N3010 Interstitial cystitis (chronic) without hematuria: Secondary | ICD-10-CM | POA: Diagnosis not present

## 2019-12-16 DIAGNOSIS — J309 Allergic rhinitis, unspecified: Secondary | ICD-10-CM | POA: Diagnosis not present

## 2020-01-13 ENCOUNTER — Telehealth: Payer: Self-pay | Admitting: Adult Health

## 2020-01-13 MED ORDER — ZONISAMIDE 100 MG PO CAPS: 100.0000 mg | ORAL_CAPSULE | Freq: Two times a day (BID) | ORAL | 0 refills | Status: DC

## 2020-01-13 NOTE — Telephone Encounter (Signed)
Pt called stating that [facility] informed her that they are needing a new Rx sent to them for her zonisamide (ZONEGRAN) 100 MG capsule Please advise.

## 2020-01-13 NOTE — Telephone Encounter (Signed)
Refill sent as requested. 

## 2020-01-22 ENCOUNTER — Ambulatory Visit: Payer: Medicare Other | Admitting: Physician Assistant

## 2020-01-26 ENCOUNTER — Other Ambulatory Visit: Payer: Self-pay | Admitting: Neurology

## 2020-01-27 DIAGNOSIS — J309 Allergic rhinitis, unspecified: Secondary | ICD-10-CM | POA: Diagnosis not present

## 2020-01-30 DIAGNOSIS — K6389 Other specified diseases of intestine: Secondary | ICD-10-CM | POA: Diagnosis not present

## 2020-01-30 DIAGNOSIS — A049 Bacterial intestinal infection, unspecified: Secondary | ICD-10-CM | POA: Diagnosis not present

## 2020-02-02 DIAGNOSIS — M25512 Pain in left shoulder: Secondary | ICD-10-CM | POA: Diagnosis not present

## 2020-02-02 DIAGNOSIS — R0789 Other chest pain: Secondary | ICD-10-CM | POA: Diagnosis not present

## 2020-02-12 DIAGNOSIS — R0789 Other chest pain: Secondary | ICD-10-CM | POA: Diagnosis not present

## 2020-02-23 DIAGNOSIS — J309 Allergic rhinitis, unspecified: Secondary | ICD-10-CM | POA: Diagnosis not present

## 2020-03-02 DIAGNOSIS — J309 Allergic rhinitis, unspecified: Secondary | ICD-10-CM | POA: Diagnosis not present

## 2020-03-09 DIAGNOSIS — J309 Allergic rhinitis, unspecified: Secondary | ICD-10-CM | POA: Diagnosis not present

## 2020-03-12 DIAGNOSIS — Z23 Encounter for immunization: Secondary | ICD-10-CM | POA: Diagnosis not present

## 2020-03-17 DIAGNOSIS — R1031 Right lower quadrant pain: Secondary | ICD-10-CM | POA: Diagnosis not present

## 2020-03-17 DIAGNOSIS — R197 Diarrhea, unspecified: Secondary | ICD-10-CM | POA: Diagnosis not present

## 2020-03-17 DIAGNOSIS — J309 Allergic rhinitis, unspecified: Secondary | ICD-10-CM | POA: Diagnosis not present

## 2020-03-17 DIAGNOSIS — Z79899 Other long term (current) drug therapy: Secondary | ICD-10-CM | POA: Diagnosis not present

## 2020-03-17 DIAGNOSIS — K900 Celiac disease: Secondary | ICD-10-CM | POA: Diagnosis not present

## 2020-03-23 DIAGNOSIS — J309 Allergic rhinitis, unspecified: Secondary | ICD-10-CM | POA: Diagnosis not present

## 2020-03-25 DIAGNOSIS — M25572 Pain in left ankle and joints of left foot: Secondary | ICD-10-CM | POA: Diagnosis not present

## 2020-03-25 DIAGNOSIS — M25571 Pain in right ankle and joints of right foot: Secondary | ICD-10-CM | POA: Diagnosis not present

## 2020-03-25 DIAGNOSIS — M6281 Muscle weakness (generalized): Secondary | ICD-10-CM | POA: Diagnosis not present

## 2020-03-25 IMAGING — MG DIGITAL SCREENING BILATERAL MAMMOGRAM WITH CAD
5 series · 5 of 5 positions shown · non-contrast
Comparison: Previous exam(s).

CLINICAL DATA: Screening.

EXAM:
DIGITAL SCREENING BILATERAL MAMMOGRAM WITH CAD

[R CC]
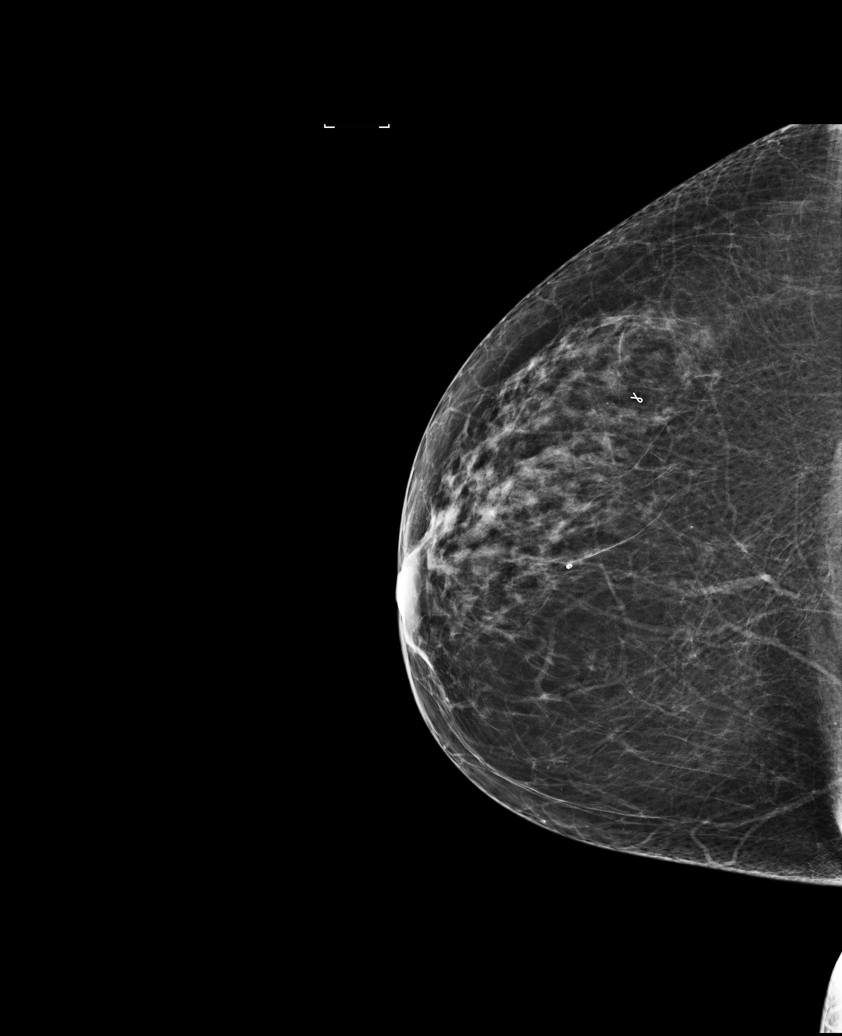

[L MLO]
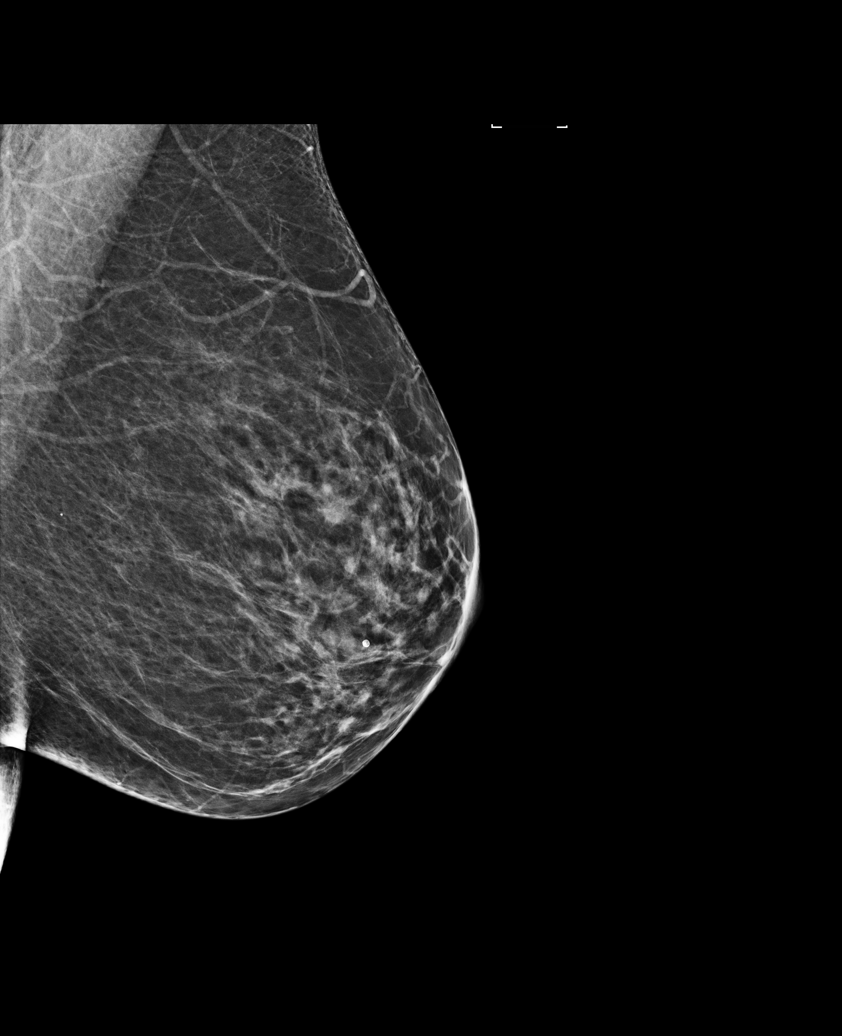

[L CC (1 of 2)]
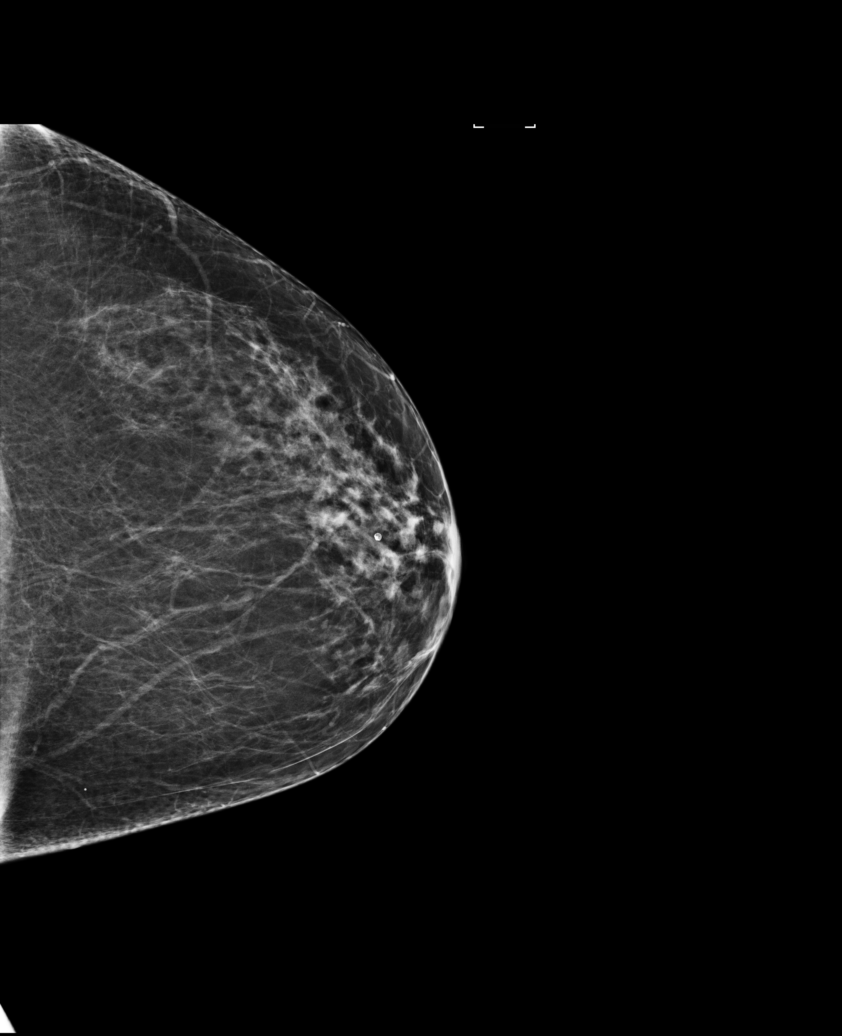

[L CC (2 of 2)]
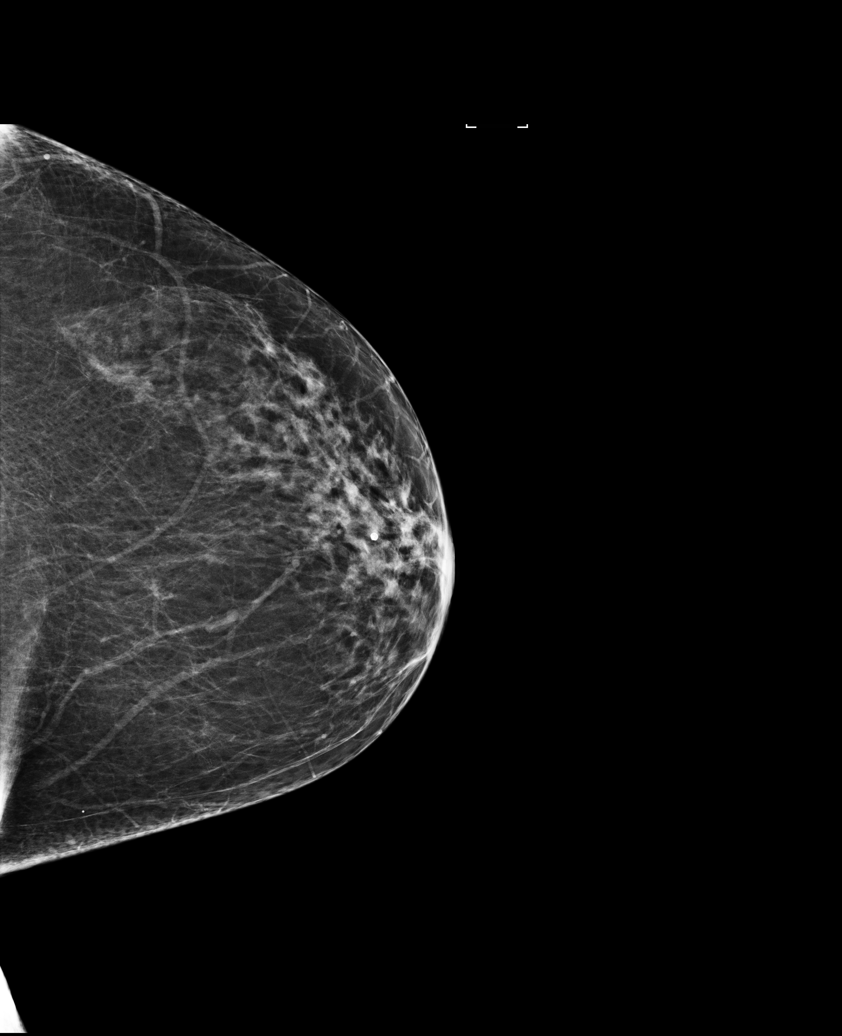

[R MLO]
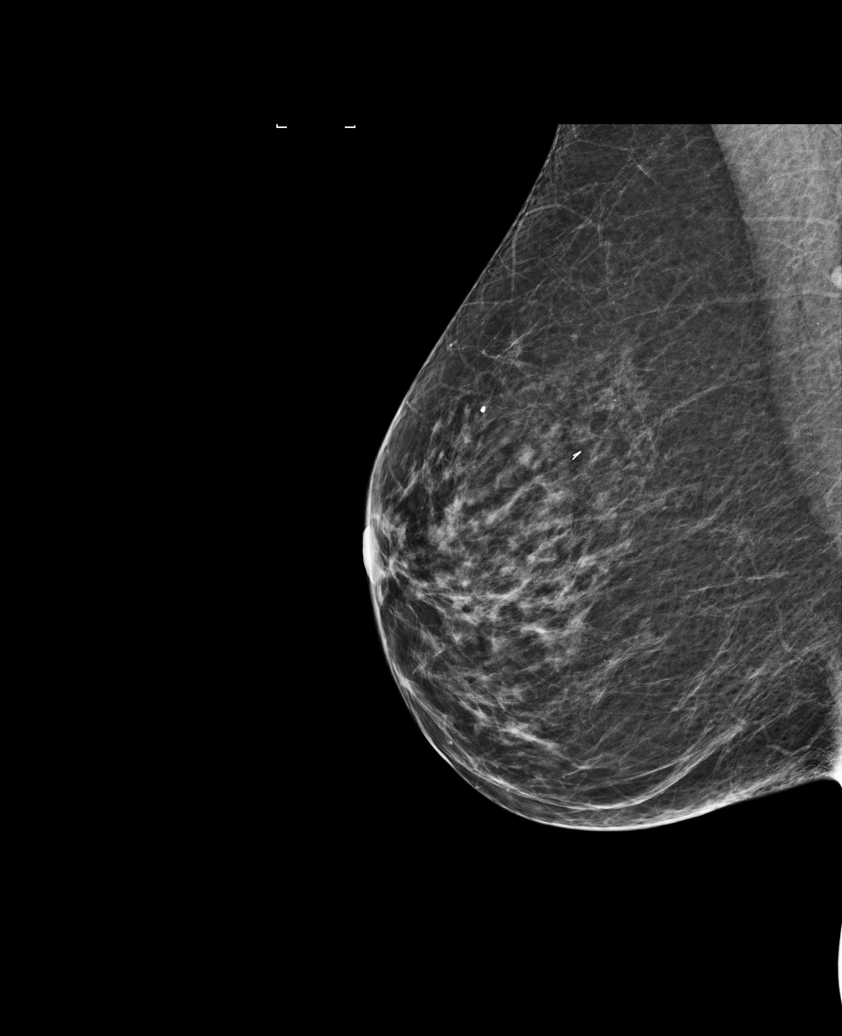

[5 of 5 positions shown; findings below may reference images not displayed]

ACR Breast Density Category b: There are scattered areas of
fibroglandular density.
FINDINGS: There are no findings suspicious for malignancy. Images were
processed with CAD.
IMPRESSION: No mammographic evidence of malignancy. A result letter of this
screening mammogram will be mailed directly to the patient.

RECOMMENDATION:
Screening mammogram in one year. (Code:AS-G-LCT)

BI-RADS CATEGORY  1: Negative.

## 2020-03-29 DIAGNOSIS — M6281 Muscle weakness (generalized): Secondary | ICD-10-CM | POA: Diagnosis not present

## 2020-03-29 DIAGNOSIS — M25571 Pain in right ankle and joints of right foot: Secondary | ICD-10-CM | POA: Diagnosis not present

## 2020-03-29 DIAGNOSIS — M25572 Pain in left ankle and joints of left foot: Secondary | ICD-10-CM | POA: Diagnosis not present

## 2020-03-29 DIAGNOSIS — J309 Allergic rhinitis, unspecified: Secondary | ICD-10-CM | POA: Diagnosis not present

## 2020-03-31 DIAGNOSIS — M25571 Pain in right ankle and joints of right foot: Secondary | ICD-10-CM | POA: Diagnosis not present

## 2020-03-31 DIAGNOSIS — M6281 Muscle weakness (generalized): Secondary | ICD-10-CM | POA: Diagnosis not present

## 2020-03-31 DIAGNOSIS — M25572 Pain in left ankle and joints of left foot: Secondary | ICD-10-CM | POA: Diagnosis not present

## 2020-04-19 ENCOUNTER — Ambulatory Visit: Payer: Medicare Other | Admitting: Physician Assistant

## 2020-04-22 DIAGNOSIS — M25571 Pain in right ankle and joints of right foot: Secondary | ICD-10-CM | POA: Diagnosis not present

## 2020-04-22 DIAGNOSIS — M25572 Pain in left ankle and joints of left foot: Secondary | ICD-10-CM | POA: Diagnosis not present

## 2020-04-22 DIAGNOSIS — K900 Celiac disease: Secondary | ICD-10-CM | POA: Diagnosis not present

## 2020-04-22 DIAGNOSIS — E7800 Pure hypercholesterolemia, unspecified: Secondary | ICD-10-CM | POA: Diagnosis not present

## 2020-04-22 DIAGNOSIS — M6281 Muscle weakness (generalized): Secondary | ICD-10-CM | POA: Diagnosis not present

## 2020-04-22 DIAGNOSIS — G629 Polyneuropathy, unspecified: Secondary | ICD-10-CM | POA: Diagnosis not present

## 2020-04-22 DIAGNOSIS — E860 Dehydration: Secondary | ICD-10-CM | POA: Diagnosis not present

## 2020-04-26 ENCOUNTER — Ambulatory Visit: Payer: Medicare Other | Admitting: Dermatology

## 2020-04-27 DIAGNOSIS — J309 Allergic rhinitis, unspecified: Secondary | ICD-10-CM | POA: Diagnosis not present

## 2020-04-28 DIAGNOSIS — M6281 Muscle weakness (generalized): Secondary | ICD-10-CM | POA: Diagnosis not present

## 2020-04-28 DIAGNOSIS — M25572 Pain in left ankle and joints of left foot: Secondary | ICD-10-CM | POA: Diagnosis not present

## 2020-04-28 DIAGNOSIS — M25571 Pain in right ankle and joints of right foot: Secondary | ICD-10-CM | POA: Diagnosis not present

## 2020-05-04 DIAGNOSIS — M25571 Pain in right ankle and joints of right foot: Secondary | ICD-10-CM | POA: Diagnosis not present

## 2020-05-04 DIAGNOSIS — M6281 Muscle weakness (generalized): Secondary | ICD-10-CM | POA: Diagnosis not present

## 2020-05-04 DIAGNOSIS — M25572 Pain in left ankle and joints of left foot: Secondary | ICD-10-CM | POA: Diagnosis not present

## 2020-05-04 DIAGNOSIS — J309 Allergic rhinitis, unspecified: Secondary | ICD-10-CM | POA: Diagnosis not present

## 2020-05-07 DIAGNOSIS — M25571 Pain in right ankle and joints of right foot: Secondary | ICD-10-CM | POA: Diagnosis not present

## 2020-05-07 DIAGNOSIS — M25572 Pain in left ankle and joints of left foot: Secondary | ICD-10-CM | POA: Diagnosis not present

## 2020-05-07 DIAGNOSIS — M6281 Muscle weakness (generalized): Secondary | ICD-10-CM | POA: Diagnosis not present

## 2020-05-10 DIAGNOSIS — K219 Gastro-esophageal reflux disease without esophagitis: Secondary | ICD-10-CM | POA: Diagnosis not present

## 2020-05-10 DIAGNOSIS — Z0000 Encounter for general adult medical examination without abnormal findings: Secondary | ICD-10-CM | POA: Diagnosis not present

## 2020-05-10 DIAGNOSIS — F329 Major depressive disorder, single episode, unspecified: Secondary | ICD-10-CM | POA: Diagnosis not present

## 2020-05-10 DIAGNOSIS — E7800 Pure hypercholesterolemia, unspecified: Secondary | ICD-10-CM | POA: Diagnosis not present

## 2020-05-10 DIAGNOSIS — J309 Allergic rhinitis, unspecified: Secondary | ICD-10-CM | POA: Diagnosis not present

## 2020-05-11 DIAGNOSIS — J309 Allergic rhinitis, unspecified: Secondary | ICD-10-CM | POA: Diagnosis not present

## 2020-05-13 DIAGNOSIS — M5459 Other low back pain: Secondary | ICD-10-CM | POA: Diagnosis not present

## 2020-05-13 DIAGNOSIS — M25551 Pain in right hip: Secondary | ICD-10-CM | POA: Diagnosis not present

## 2020-05-20 DIAGNOSIS — Z20822 Contact with and (suspected) exposure to covid-19: Secondary | ICD-10-CM | POA: Diagnosis not present

## 2020-05-31 DIAGNOSIS — M25572 Pain in left ankle and joints of left foot: Secondary | ICD-10-CM | POA: Diagnosis not present

## 2020-05-31 DIAGNOSIS — M79671 Pain in right foot: Secondary | ICD-10-CM | POA: Diagnosis not present

## 2020-06-01 DIAGNOSIS — T7840XA Allergy, unspecified, initial encounter: Secondary | ICD-10-CM | POA: Diagnosis not present

## 2020-06-01 DIAGNOSIS — J309 Allergic rhinitis, unspecified: Secondary | ICD-10-CM | POA: Diagnosis not present

## 2020-06-02 DIAGNOSIS — T450X5A Adverse effect of antiallergic and antiemetic drugs, initial encounter: Secondary | ICD-10-CM | POA: Diagnosis not present

## 2020-06-02 DIAGNOSIS — T8089XA Other complications following infusion, transfusion and therapeutic injection, initial encounter: Secondary | ICD-10-CM | POA: Diagnosis not present

## 2020-06-08 DIAGNOSIS — M25572 Pain in left ankle and joints of left foot: Secondary | ICD-10-CM | POA: Diagnosis not present

## 2020-06-11 DIAGNOSIS — N3281 Overactive bladder: Secondary | ICD-10-CM | POA: Diagnosis not present

## 2020-06-11 DIAGNOSIS — N3010 Interstitial cystitis (chronic) without hematuria: Secondary | ICD-10-CM | POA: Diagnosis not present

## 2020-06-14 DIAGNOSIS — F411 Generalized anxiety disorder: Secondary | ICD-10-CM | POA: Diagnosis not present

## 2020-06-14 DIAGNOSIS — R0602 Shortness of breath: Secondary | ICD-10-CM | POA: Diagnosis not present

## 2020-06-14 DIAGNOSIS — J189 Pneumonia, unspecified organism: Secondary | ICD-10-CM | POA: Diagnosis not present

## 2020-06-14 DIAGNOSIS — J4540 Moderate persistent asthma, uncomplicated: Secondary | ICD-10-CM | POA: Diagnosis not present

## 2020-06-14 DIAGNOSIS — R0600 Dyspnea, unspecified: Secondary | ICD-10-CM | POA: Diagnosis not present

## 2020-06-14 DIAGNOSIS — R002 Palpitations: Secondary | ICD-10-CM | POA: Diagnosis not present

## 2020-06-15 DIAGNOSIS — R070 Pain in throat: Secondary | ICD-10-CM | POA: Diagnosis not present

## 2020-06-20 ENCOUNTER — Other Ambulatory Visit: Payer: Self-pay | Admitting: Adult Health

## 2020-06-29 ENCOUNTER — Encounter: Payer: Self-pay | Admitting: Dermatology

## 2020-06-29 ENCOUNTER — Other Ambulatory Visit: Payer: Self-pay

## 2020-06-29 ENCOUNTER — Ambulatory Visit (INDEPENDENT_AMBULATORY_CARE_PROVIDER_SITE_OTHER): Payer: Medicare Other | Admitting: Dermatology

## 2020-06-29 DIAGNOSIS — R238 Other skin changes: Secondary | ICD-10-CM | POA: Diagnosis not present

## 2020-06-29 DIAGNOSIS — L821 Other seborrheic keratosis: Secondary | ICD-10-CM

## 2020-06-29 DIAGNOSIS — L738 Other specified follicular disorders: Secondary | ICD-10-CM

## 2020-06-29 DIAGNOSIS — L570 Actinic keratosis: Secondary | ICD-10-CM

## 2020-06-29 DIAGNOSIS — L720 Epidermal cyst: Secondary | ICD-10-CM | POA: Diagnosis not present

## 2020-06-29 DIAGNOSIS — Z1283 Encounter for screening for malignant neoplasm of skin: Secondary | ICD-10-CM | POA: Diagnosis not present

## 2020-06-29 DIAGNOSIS — D1801 Hemangioma of skin and subcutaneous tissue: Secondary | ICD-10-CM | POA: Diagnosis not present

## 2020-06-29 DIAGNOSIS — L820 Inflamed seborrheic keratosis: Secondary | ICD-10-CM | POA: Diagnosis not present

## 2020-06-29 DIAGNOSIS — T148XXA Other injury of unspecified body region, initial encounter: Secondary | ICD-10-CM

## 2020-06-29 NOTE — Patient Instructions (Addendum)
[facility] [phone] Dr. Kalman Shan

## 2020-07-05 ENCOUNTER — Other Ambulatory Visit: Payer: Self-pay | Admitting: Adult Health

## 2020-07-10 ENCOUNTER — Encounter: Payer: Self-pay | Admitting: Dermatology

## 2020-07-10 MED ORDER — IMIQUIMOD 5 % EX CREA: TOPICAL_CREAM | Freq: Every day | CUTANEOUS | 0 refills | Status: DC

## 2020-07-10 NOTE — Progress Notes (Signed)
   Follow-Up Visit   Subjective  Katelyn Mcclain is a 70 y.o. female who presents for the following: Annual Exam (Patient has a list of all her spots of concern).  Several spots to be examined Location:  Duration:  Quality:  Associated Signs/Symptoms: Modifying Factors:  Severity:  Timing: Context:   Objective  Well appearing patient in no apparent distress; mood and affect are within normal limits. Objective  Left Inguinal Area: Full body skin check no atypical pigmented cells or skin cancer.  Objective  Mid Frontal Scalp: 2 mm smooth red papule  Objective  Chest - Medial (Center), Left Inframammary Fold, Right Inframammary Fold: Multiple brown flattopped textured 4 to 7 mm papules  Objective  Left Buccal Cheek (3), Left Medial Thigh, Mid Back (2), Right Thigh - Anterior: Several inflamed pink-tan flattopped textured papules (history of itching).  Objective  Left Malar Cheek: Erythematous patches with gritty scale.     A full examination was performed including scalp, head, eyes, ears, nose, lips, neck, chest, axillae, abdomen, back, buttocks, bilateral upper extremities, bilateral lower extremities, hands, feet, fingers, toes, fingernails, and toenails. All findings within normal limits unless otherwise noted below.  Areas beneath undergarments not fully examined   Assessment & Plan    Screening exam for skin cancer Left Inguinal Area  Yearly skin check, encouraged to self examine twice annually.  Continued ultraviolet protection  Cherry angioma Mid Frontal Scalp  Stable, okay to leave  Seborrheic keratosis (3) Chest - Medial (Center); Left Inframammary Fold; Right Inframammary Fold  Benign- ok to leave if stable  Milia Right Malar Cheek  Inflamed seborrheic keratosis (7) Right Thigh - Anterior; Left Medial Thigh; Mid Back (2); Left Buccal Cheek (3)  Destruction of lesion - Left Buccal Cheek Complexity: simple   Destruction method: cryotherapy    Informed consent: discussed and consent obtained   Timeout:  patient name, date of birth, surgical site, and procedure verified Lesion destroyed using liquid nitrogen: Yes   Cryotherapy cycles:  5 Outcome: patient tolerated procedure well with no complications   Post-procedure details: wound care instructions given    Sebaceous hyperplasia Right Temple  AK (actinic keratosis) Left Malar Cheek  Destruction of lesion - Left Malar Cheek  Venous lake of skin of nose Dorsum of Nose      I, Lavonna Monarch, MD, have reviewed all documentation for this visit.  The documentation on 07/10/20 for the exam, diagnosis, procedures, and orders are all accurate and complete.

## 2020-07-19 ENCOUNTER — Telehealth: Payer: Self-pay | Admitting: Dermatology

## 2020-07-19 MED ORDER — KETOCONAZOLE 2 % EX SHAM: 1.0000 "application " | MEDICATED_SHAMPOO | Freq: Once | CUTANEOUS | 3 refills | Status: AC

## 2020-07-19 NOTE — Telephone Encounter (Signed)
Patient is calling for a refill on Ketaconazole Shampoo.  Patient has not used it for a few years, but wants to start using for an itchy scalp.  Patient uses [facility] in [place], [place].

## 2020-07-27 DIAGNOSIS — N3010 Interstitial cystitis (chronic) without hematuria: Secondary | ICD-10-CM | POA: Diagnosis not present

## 2020-08-11 DIAGNOSIS — K6389 Other specified diseases of intestine: Secondary | ICD-10-CM | POA: Diagnosis not present

## 2020-08-11 DIAGNOSIS — K900 Celiac disease: Secondary | ICD-10-CM | POA: Diagnosis not present

## 2020-08-27 IMAGING — DX DG CHEST 1V PORT
1 series · 1 of 1 positions shown · non-contrast
Comparison: 09/16/2017.

CLINICAL DATA: Shortness of breath.  Cough.

EXAM:
PORTABLE CHEST 1 VIEW

[chest ap]
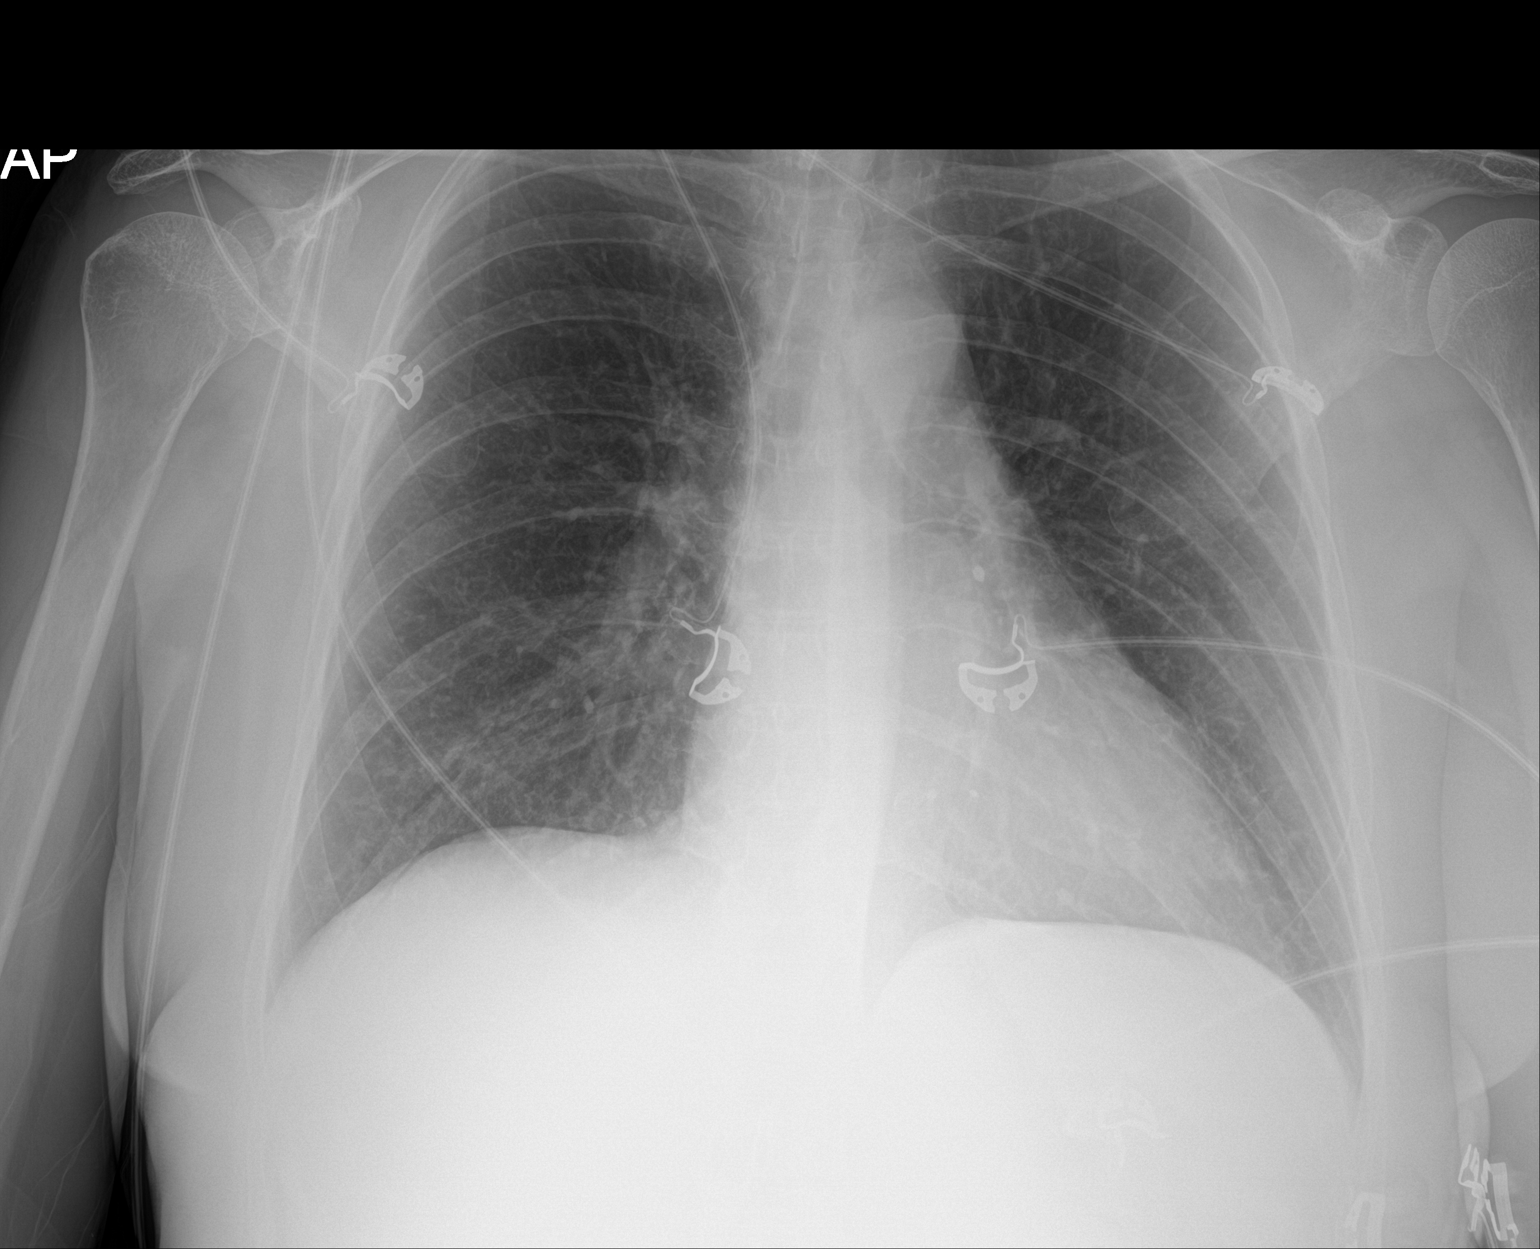

[1 of 1 positions shown; findings below may reference images not displayed]

FINDINGS: Mediastinum and hilar structures normal. Heart size normal. No focal
infiltrate. No pleural effusion or pneumothorax. No acute bony
abnormality identified.
IMPRESSION: No acute cardiopulmonary disease.

## 2020-10-26 DIAGNOSIS — R197 Diarrhea, unspecified: Secondary | ICD-10-CM | POA: Diagnosis not present

## 2020-10-26 DIAGNOSIS — R634 Abnormal weight loss: Secondary | ICD-10-CM | POA: Diagnosis not present

## 2020-10-26 DIAGNOSIS — K900 Celiac disease: Secondary | ICD-10-CM | POA: Diagnosis not present

## 2020-10-26 DIAGNOSIS — Z86010 Personal history of colonic polyps: Secondary | ICD-10-CM | POA: Diagnosis not present

## 2020-10-26 DIAGNOSIS — Z8719 Personal history of other diseases of the digestive system: Secondary | ICD-10-CM | POA: Diagnosis not present

## 2020-10-26 DIAGNOSIS — R1031 Right lower quadrant pain: Secondary | ICD-10-CM | POA: Diagnosis not present

## 2020-10-28 DIAGNOSIS — R1031 Right lower quadrant pain: Secondary | ICD-10-CM | POA: Diagnosis not present

## 2020-10-28 DIAGNOSIS — R197 Diarrhea, unspecified: Secondary | ICD-10-CM | POA: Diagnosis not present

## 2020-11-02 DIAGNOSIS — K900 Celiac disease: Secondary | ICD-10-CM | POA: Diagnosis not present

## 2020-11-02 DIAGNOSIS — K449 Diaphragmatic hernia without obstruction or gangrene: Secondary | ICD-10-CM | POA: Diagnosis not present

## 2020-11-02 DIAGNOSIS — R109 Unspecified abdominal pain: Secondary | ICD-10-CM | POA: Diagnosis not present

## 2020-11-02 DIAGNOSIS — R197 Diarrhea, unspecified: Secondary | ICD-10-CM | POA: Diagnosis not present

## 2020-11-02 DIAGNOSIS — R1031 Right lower quadrant pain: Secondary | ICD-10-CM | POA: Diagnosis not present

## 2020-12-01 ENCOUNTER — Ambulatory Visit (INDEPENDENT_AMBULATORY_CARE_PROVIDER_SITE_OTHER): Payer: Medicare Other | Admitting: Adult Health

## 2020-12-01 ENCOUNTER — Other Ambulatory Visit: Payer: Self-pay

## 2020-12-01 ENCOUNTER — Encounter: Payer: Self-pay | Admitting: Adult Health

## 2020-12-01 VITALS — BP 104/69 | HR 89 | Ht 70.0 in | Wt 169.4 lb

## 2020-12-01 DIAGNOSIS — R202 Paresthesia of skin: Secondary | ICD-10-CM

## 2020-12-01 DIAGNOSIS — G43009 Migraine without aura, not intractable, without status migrainosus: Secondary | ICD-10-CM

## 2020-12-01 MED ORDER — ZONISAMIDE 100 MG PO CAPS: 100.0000 mg | ORAL_CAPSULE | Freq: Two times a day (BID) | ORAL | 3 refills | Status: DC

## 2020-12-01 MED ORDER — RIZATRIPTAN BENZOATE 10 MG PO TBDP: ORAL_TABLET | ORAL | 3 refills | Status: DC

## 2020-12-01 NOTE — Patient Instructions (Signed)
Your Plan:  Continue Zonegran 100 mg twice a day Continue Maxalt for abortive therapy If your symptoms worsen or you develop new symptoms please let us know.   Thank you for coming to see [place] at [facility]. I hope we have been able to provide you high quality care today.  You may receive a patient satisfaction survey over the next few weeks. We would appreciate your feedback and comments so that we may continue to improve ourselves and the health of our patients.

## 2020-12-01 NOTE — Progress Notes (Signed)
I have read the note, and I agree with the clinical assessment and plan.  Katelyn Mcclain K [name]   

## 2020-12-01 NOTE — Progress Notes (Signed)
PATIENT: IMMACULATE CRUTCHER DOB: [birth year]  REASON FOR VISIT: follow up HISTORY FROM: patient  HISTORY OF PRESENT ILLNESS: Today 12/01/20:  Ms. Fahmy is a 70 year old female with a history of migraine headaches and paresthesias in the lower extremities.  She returns today for follow-up.  Overall she feels that her headaches have remained relatively stable.  She continues to use Maxalt as an abortive therapy and Zonegran 100 mg daily for prevention.  Reports that she is not even having 1 headache a month now.  Numbness in the lower extremities have remained relatively stable.  Reports that she seen orthopedics for Planter fasciitis. no changes in her gait or balance.  12/01/19: Ms. Iverson is a 70 year old female with a history of migraine headaches and paresthesias in the lower extremities.  She returns today for follow-up.  She states that her headaches have remained relatively stable.  She has approximately 1 headache a month.  Triggers her barometric pressure.  She does state that she [place] to the [place] and this made her headaches worse.  She also has a history of Mnire's disease.  She states that Maxalt is beneficial for her headaches.  She continues to have numbness in the lower extremities.  Tried and failed gabapentin.  No changes in her gait or balance.  HISTORY 06/02/19:   Ms. Ries is a 70 year old female with a history of migraine headaches and paresthesias in the lower extremities.  She returns today for follow-up.  She reports that her headaches have increased in the last month or so.  She feels that this is related to the weather.  She states that she is using more Maxalt.  Approximately 2 to 4 tablets a week.  She reduced her Zonegran dose to 100 mg twice a day.  Ultimately she would like to continue to reduce the dose.  She continues to have numbness in the toes.  Nerve conduction studies did reveal a early neuropathy.  She denies any discomfort.  Denies any changes with  her gait or balance.  She tried gabapentin but did not like this medication.  She returns today for evaluation  REVIEW OF SYSTEMS: Out of a complete 14 system review of symptoms, the patient complains only of the following symptoms, and all other reviewed systems are negative.  See HPI  ALLERGIES: Allergies  Allergen Reactions   Adhesive [Tape] Other (See Comments)    Tear's skin off    Morphine And Related Other (See Comments)    Sensitive to narcotics, worsening migraines.   Barley Grass Other (See Comments)    Celiac disease   Also rye   Clonazepam Other (See Comments)    Caused anxiety   Linaclotide Other (See Comments)    Caused spastic bladder (reaction to Linzess)   Metronidazole Other (See Comments)    Black tongue and diarrhea   Oatmeal Other (See Comments)    Celiac disease   Other     Pt does not use artificial sweeteners    Wheat Bran Other (See Comments)    Celiac disease    HOME MEDICATIONS: Outpatient Medications Prior to Visit  Medication Sig Dispense Refill   albuterol (PROVENTIL HFA;VENTOLIN HFA) 108 (90 BASE) MCG/ACT inhaler Inhale 1 puff into the lungs every 6 (six) hours as needed for wheezing or shortness of breath (seasonal allergies).      albuterol (PROVENTIL) (2.5 MG/3ML) 0.083% nebulizer solution Take 3 mLs (2.5 mg total) by nebulization every 6 (six) hours as needed for wheezing or shortness  of breath. 75 mL 0   b complex vitamins capsule Take by mouth.     benzonatate (TESSALON) 100 MG capsule Take 1 capsule (100 mg total) by mouth 3 (three) times daily as needed for cough. 21 capsule 0   Calcium-Magnesium-Vitamin D (CALCIUM 1200+D3 PO) Take 1,200 mg by mouth daily.     Clobetasol Propionate (IMPOYZ) 0.025 % CREA Apply 1 application topically daily. 60 g 0   conjugated estrogens (PREMARIN) vaginal cream Premarin 0.625 mg/gram vaginal cream  INSERT 0.5 APPLICATOR(S)FUL EVERY DAY BY VAGINAL ROUTE.     docusate sodium (COLACE) 100 MG capsule Take 100  mg by mouth 2 (two) times daily.     Eszopiclone 3 MG TABS Take 1.5 mg by mouth at bedtime as needed (sleep). Take immediately before bedtime     FLUAD 0.5 ML SUSY TO BE ADMINISTERED BY PHARMACIST FOR IMMUNIZATION     Garlic (GARLIQUE PO) Take 1 capsule by mouth daily.      imiquimod (ALDARA) 5 % cream Apply topically at bedtime. 12 each 0   Meth-Hyo-M Bl-Na Phos-Ph Sal (URO-MP) 118 MG CAPS TAKE 1 CAPSULE BY MOUTH TWICE A DAY AS NEEDED 30 capsule 5   montelukast (SINGULAIR) 10 MG tablet Take 10 mg by mouth at bedtime.      MYRBETRIQ 25 MG TB24 tablet      MYRBETRIQ 50 MG TB24 tablet TAKE 1 TABLET DAILY 90 tablet 3   omeprazole (PRILOSEC) 40 MG capsule Take 40 mg by mouth daily.     ondansetron (ZOFRAN) 4 MG tablet TAKE 1 TABLET BY MOUTH EVERY 6 HOURS AS NEEDED FOR NAUSEA - IF PHERERGAN NOT WORKING  2   OVER THE COUNTER MEDICATION Take 500 mg by mouth daily. Vitamin B5     pravastatin (PRAVACHOL) 10 MG tablet Take 10 mg by mouth daily.     Probiotic Product (PROBIOTIC DAILY PO) Take 1 tablet by mouth 2 (two) times daily.      promethazine (PHENERGAN) 25 MG tablet Take 1 tablet (25 mg total) by mouth every 6 (six) hours as needed for nausea or vomiting. 10 tablet 0   pyridoxine (B-6) 200 MG tablet Take 200 mg by mouth daily.     rizatriptan (MAXALT-MLT) 10 MG disintegrating tablet USE AS INSTRUCTED BY YOUR PRESCRIBER 27 tablet 3   vortioxetine HBr (TRINTELLIX) 20 MG TABS tablet Take 20 mg by mouth at bedtime.     zonisamide (ZONEGRAN) 100 MG capsule TAKE 1 CAPSULE TWICE A DAY 180 capsule 1   Facility-Administered Medications Prior to Visit  Medication Dose Route Frequency Provider Last Rate Last Admin   ipratropium (ATROVENT) nebulizer solution 0.5 mg  0.5 mg Nebulization Once Dunn, Ryan M, PA-C        PAST MEDICAL HISTORY: Past Medical History:  Diagnosis Date   Abscessed tooth    Allergic rhinitis    Asthma    Asthma    inhaler prn   Celiac disease    Depression with anxiety     Dysmenorrhea    Gastroparesis    GERD (gastroesophageal reflux disease)    Hx of low back pain    IBS (irritable bowel syndrome)    Migraine    Migraine without aura, with intractable migraine, so stated, without mention of status migrainosus 05/20/2013   Osteopenia     PAST SURGICAL HISTORY: Past Surgical History:  Procedure Laterality Date   ABDOMINAL SURGERY     BREAST BIOPSY Right    x2   BREAST CYST  ASPIRATION  1999   COLONOSCOPY     FOOT SURGERY Left    Bunionectomy   MASS EXCISION  08/08/2011   Procedure: MINOR EXCISION OF MASS;  Surgeon: Cammie Sickle., MD;  Location: [facility];  Service: Orthopedics;  Laterality: Left;  left ring excision cyst proximal phalangeal joint   NISSEN FUNDOPLICATION     ROTATOR CUFF REPAIR Right 05/2015   TONSILLECTOMY      FAMILY HISTORY: Family History  Problem Relation Age of Onset   Stroke Father    Dementia Father    Atrial fibrillation Father    Cancer Father    Hypertension Mother    Scoliosis Mother    Heart disease Sister        Congenital heart disease   Breast cancer Maternal Grandmother    Migraines Neg Hx     SOCIAL HISTORY: Social History   Socioeconomic History   Marital status: Married    Spouse name: Not on file   Number of children: 0   Years of education: college   Highest education level: Not on file  Occupational History    Employer: [employer]  Tobacco Use   Smoking status: Former   Smokeless tobacco: Never   Tobacco comments:    IN college 1 year  Vaping Use   Vaping Use: Never used  Substance and Sexual Activity   Alcohol use: Yes    Alcohol/week: 0.0 standard drinks    Comment: wine occasionally   Drug use: No   Sexual activity: Yes    Birth control/protection: Post-menopausal  Other Topics Concern   Not on file  Social History Narrative   Patient lives at home with her husband Hoy Morn).   Patient is retired.   Education college.    Right handed.    Caffeine one cup daily.   Social Determinants of Health   Financial Resource Strain: Not on file  Food Insecurity: Not on file  Transportation Needs: Not on file  Physical Activity: Not on file  Stress: Not on file  Social Connections: Not on file  Intimate Partner Violence: Not on file      PHYSICAL EXAM  Vitals:   12/01/20 1132  BP: 104/69  Pulse: 89  Weight: 169 lb 6.4 oz (76.8 kg)  Height: 5' 10"  (1.778 m)    Body mass index is 24.31 kg/m.  Generalized: Well developed, in no acute distress   Neurological examination  Mentation: Alert oriented to time, place, history taking. Follows all commands speech and language fluent Cranial nerve II-XII:  Extraocular movements were full, visual field were full on confrontational test.  Head turning and shoulder shrug  were normal and symmetric. Motor: The motor testing reveals 5 over 5 strength of all 4 extremities. Good symmetric motor tone is noted throughout.  Sensory: Sensory testing is intact to soft touch on all 4 extremities. No evidence of extinction is noted.  Coordination: Cerebellar testing reveals good finger-nose-finger and heel-to-shin bilaterally.  Gait and station: Gait is normal.  Reflexes: Deep tendon reflexes are symmetric and normal bilaterally.   DIAGNOSTIC DATA (LABS, IMAGING, TESTING) - I reviewed patient records, labs, notes, testing and imaging myself where available.  Lab Results  Component Value Date   WBC 26.3 (H) 05/01/2019   HGB 14.3 05/01/2019   HCT 42.7 05/01/2019   MCV 91.8 05/01/2019   PLT 477 (H) 05/01/2019      Component Value Date/Time   NA  136 05/01/2019 1049   K 4.1 05/01/2019 1049   CL 106 05/01/2019 1049   CO2 21 (L) 05/01/2019 1049   GLUCOSE 98 05/01/2019 1049   BUN 34 (H) 05/01/2019 1049   CREATININE 1.02 (H) 05/01/2019 1049   CALCIUM 8.6 (L) 05/01/2019 1049   PROT 6.5 05/01/2019 1049   ALBUMIN 3.3 (L) 05/01/2019 1049   AST 45 (H) 05/01/2019 1049   ALT  79 (H) 05/01/2019 1049   ALKPHOS 72 05/01/2019 1049   BILITOT 0.5 05/01/2019 1049   GFRNONAA 57 (L) 05/01/2019 1049   GFRAA >60 05/01/2019 1049    Lab Results  Component Value Date   UUEKCMKL49 179 12/25/2017   Lab Results  Component Value Date   TSH 1.138 10/01/2017      ASSESSMENT AND PLAN 70 y.o. year old female  has a past medical history of Abscessed tooth, Allergic rhinitis, Asthma, Asthma, Celiac disease, Depression with anxiety, Dysmenorrhea, Gastroparesis, GERD (gastroesophageal reflux disease), low back pain, IBS (irritable bowel syndrome), Migraine, Migraine without aura, with intractable migraine, so stated, without mention of status migrainosus (05/20/2013), and Osteopenia. here with:  1.  Migraine headaches  Continue Zonegran 100 mg twice a day for preventative therapy Continue Maxalt for abortive therapy  2.  Paresthesias in the lower extremities  Continue to monitor Orthopedics put her on gabapentin 100 mg daily  Advised that her symptoms worsen or she develops new symptoms she should let us know Follow-up in 1 year or sooner if needed   Ward Givens, MSN, NP-C 12/01/2020, 11:31 AM [facility] [address], [place], [place] [address] [phone]

## 2020-12-08 ENCOUNTER — Telehealth: Payer: Self-pay | Admitting: Adult Health

## 2020-12-08 MED ORDER — RIZATRIPTAN BENZOATE 10 MG PO TBDP: ORAL_TABLET | ORAL | 3 refills | Status: DC

## 2020-12-08 MED ORDER — ZONISAMIDE 100 MG PO CAPS: 100.0000 mg | ORAL_CAPSULE | Freq: Two times a day (BID) | ORAL | 3 refills | Status: DC

## 2020-12-08 NOTE — Telephone Encounter (Signed)
Pt called stating her rizatriptan (MAXALT-MLT) 10 MG disintegrating tablet and zonisamide (ZONEGRAN) 100 MG capsule needs to be called in at [facility].

## 2020-12-08 NOTE — Telephone Encounter (Signed)
Refills sent to [facility]. Pt up to date on appts.

## 2020-12-14 DIAGNOSIS — N3010 Interstitial cystitis (chronic) without hematuria: Secondary | ICD-10-CM | POA: Diagnosis not present

## 2020-12-15 DIAGNOSIS — Z791 Long term (current) use of non-steroidal anti-inflammatories (NSAID): Secondary | ICD-10-CM | POA: Diagnosis not present

## 2020-12-15 DIAGNOSIS — K6389 Other specified diseases of intestine: Secondary | ICD-10-CM | POA: Diagnosis not present

## 2020-12-15 DIAGNOSIS — K580 Irritable bowel syndrome with diarrhea: Secondary | ICD-10-CM | POA: Diagnosis not present

## 2020-12-15 DIAGNOSIS — N3010 Interstitial cystitis (chronic) without hematuria: Secondary | ICD-10-CM | POA: Diagnosis not present

## 2020-12-15 DIAGNOSIS — Z79899 Other long term (current) drug therapy: Secondary | ICD-10-CM | POA: Diagnosis not present

## 2020-12-15 DIAGNOSIS — Z7951 Long term (current) use of inhaled steroids: Secondary | ICD-10-CM | POA: Diagnosis not present

## 2020-12-15 DIAGNOSIS — K900 Celiac disease: Secondary | ICD-10-CM | POA: Diagnosis not present

## 2020-12-15 DIAGNOSIS — K219 Gastro-esophageal reflux disease without esophagitis: Secondary | ICD-10-CM | POA: Diagnosis not present

## 2020-12-15 DIAGNOSIS — K3184 Gastroparesis: Secondary | ICD-10-CM | POA: Diagnosis not present

## 2020-12-16 DIAGNOSIS — N3010 Interstitial cystitis (chronic) without hematuria: Secondary | ICD-10-CM | POA: Diagnosis not present

## 2020-12-16 DIAGNOSIS — N3281 Overactive bladder: Secondary | ICD-10-CM | POA: Diagnosis not present

## 2020-12-22 DIAGNOSIS — Z124 Encounter for screening for malignant neoplasm of cervix: Secondary | ICD-10-CM | POA: Diagnosis not present

## 2020-12-29 DIAGNOSIS — Z1231 Encounter for screening mammogram for malignant neoplasm of breast: Secondary | ICD-10-CM | POA: Diagnosis not present

## 2020-12-29 DIAGNOSIS — Z7689 Persons encountering health services in other specified circumstances: Secondary | ICD-10-CM | POA: Diagnosis not present

## 2021-01-05 DIAGNOSIS — F329 Major depressive disorder, single episode, unspecified: Secondary | ICD-10-CM | POA: Diagnosis not present

## 2021-01-05 DIAGNOSIS — F419 Anxiety disorder, unspecified: Secondary | ICD-10-CM | POA: Diagnosis not present

## 2021-01-05 DIAGNOSIS — F331 Major depressive disorder, recurrent, moderate: Secondary | ICD-10-CM | POA: Diagnosis not present

## 2021-01-28 DIAGNOSIS — M25551 Pain in right hip: Secondary | ICD-10-CM | POA: Diagnosis not present

## 2021-01-28 DIAGNOSIS — M25552 Pain in left hip: Secondary | ICD-10-CM | POA: Diagnosis not present

## 2021-02-01 DIAGNOSIS — H2513 Age-related nuclear cataract, bilateral: Secondary | ICD-10-CM | POA: Diagnosis not present

## 2021-02-01 DIAGNOSIS — H0102B Squamous blepharitis left eye, upper and lower eyelids: Secondary | ICD-10-CM | POA: Diagnosis not present

## 2021-02-01 DIAGNOSIS — H16223 Keratoconjunctivitis sicca, not specified as Sjogren's, bilateral: Secondary | ICD-10-CM | POA: Diagnosis not present

## 2021-02-01 DIAGNOSIS — H0102A Squamous blepharitis right eye, upper and lower eyelids: Secondary | ICD-10-CM | POA: Diagnosis not present

## 2021-02-09 DIAGNOSIS — M25551 Pain in right hip: Secondary | ICD-10-CM | POA: Diagnosis not present

## 2021-02-17 DIAGNOSIS — Z23 Encounter for immunization: Secondary | ICD-10-CM | POA: Diagnosis not present

## 2021-02-28 DIAGNOSIS — N3281 Overactive bladder: Secondary | ICD-10-CM | POA: Diagnosis not present

## 2021-02-28 DIAGNOSIS — N3010 Interstitial cystitis (chronic) without hematuria: Secondary | ICD-10-CM | POA: Diagnosis not present

## 2021-04-10 DIAGNOSIS — B348 Other viral infections of unspecified site: Secondary | ICD-10-CM

## 2021-04-10 HISTORY — DX: Other viral infections of unspecified site: B34.8

## 2021-05-09 DIAGNOSIS — R5383 Other fatigue: Secondary | ICD-10-CM | POA: Diagnosis not present

## 2021-05-09 DIAGNOSIS — M810 Age-related osteoporosis without current pathological fracture: Secondary | ICD-10-CM | POA: Diagnosis not present

## 2021-05-09 DIAGNOSIS — E7800 Pure hypercholesterolemia, unspecified: Secondary | ICD-10-CM | POA: Diagnosis not present

## 2021-05-12 DIAGNOSIS — G629 Polyneuropathy, unspecified: Secondary | ICD-10-CM | POA: Diagnosis not present

## 2021-05-12 DIAGNOSIS — K219 Gastro-esophageal reflux disease without esophagitis: Secondary | ICD-10-CM | POA: Diagnosis not present

## 2021-05-12 DIAGNOSIS — M810 Age-related osteoporosis without current pathological fracture: Secondary | ICD-10-CM | POA: Diagnosis not present

## 2021-05-12 DIAGNOSIS — Z0000 Encounter for general adult medical examination without abnormal findings: Secondary | ICD-10-CM | POA: Diagnosis not present

## 2021-05-12 DIAGNOSIS — M159 Polyosteoarthritis, unspecified: Secondary | ICD-10-CM | POA: Diagnosis not present

## 2021-05-12 DIAGNOSIS — K900 Celiac disease: Secondary | ICD-10-CM | POA: Diagnosis not present

## 2021-05-12 DIAGNOSIS — F411 Generalized anxiety disorder: Secondary | ICD-10-CM | POA: Diagnosis not present

## 2021-05-12 DIAGNOSIS — G43909 Migraine, unspecified, not intractable, without status migrainosus: Secondary | ICD-10-CM | POA: Diagnosis not present

## 2021-05-12 DIAGNOSIS — E782 Mixed hyperlipidemia: Secondary | ICD-10-CM | POA: Diagnosis not present

## 2021-05-12 DIAGNOSIS — F331 Major depressive disorder, recurrent, moderate: Secondary | ICD-10-CM | POA: Diagnosis not present

## 2021-05-17 DIAGNOSIS — Z1211 Encounter for screening for malignant neoplasm of colon: Secondary | ICD-10-CM | POA: Diagnosis not present

## 2021-05-17 DIAGNOSIS — K900 Celiac disease: Secondary | ICD-10-CM | POA: Diagnosis not present

## 2021-05-17 DIAGNOSIS — Z800 Family history of malignant neoplasm of digestive organs: Secondary | ICD-10-CM | POA: Diagnosis not present

## 2021-05-17 DIAGNOSIS — K6389 Other specified diseases of intestine: Secondary | ICD-10-CM | POA: Diagnosis not present

## 2021-06-07 DIAGNOSIS — M25551 Pain in right hip: Secondary | ICD-10-CM | POA: Diagnosis not present

## 2021-06-12 ENCOUNTER — Emergency Department (HOSPITAL_BASED_OUTPATIENT_CLINIC_OR_DEPARTMENT_OTHER): Payer: Medicare Other

## 2021-06-12 ENCOUNTER — Encounter (HOSPITAL_BASED_OUTPATIENT_CLINIC_OR_DEPARTMENT_OTHER): Payer: Self-pay | Admitting: *Deleted

## 2021-06-12 ENCOUNTER — Other Ambulatory Visit: Payer: Self-pay

## 2021-06-12 ENCOUNTER — Emergency Department (HOSPITAL_BASED_OUTPATIENT_CLINIC_OR_DEPARTMENT_OTHER)
Admission: EM | Admit: 2021-06-12 | Discharge: 2021-06-12 | Disposition: A | Discharge status: Home / Self Care | Payer: Medicare Other | Attending: Emergency Medicine | Admitting: Emergency Medicine

## 2021-06-12 DIAGNOSIS — Z7951 Long term (current) use of inhaled steroids: Secondary | ICD-10-CM | POA: Insufficient documentation

## 2021-06-12 DIAGNOSIS — J209 Acute bronchitis, unspecified: Secondary | ICD-10-CM | POA: Insufficient documentation

## 2021-06-12 DIAGNOSIS — Z20822 Contact with and (suspected) exposure to covid-19: Secondary | ICD-10-CM | POA: Diagnosis not present

## 2021-06-12 DIAGNOSIS — J45909 Unspecified asthma, uncomplicated: Secondary | ICD-10-CM | POA: Diagnosis not present

## 2021-06-12 DIAGNOSIS — R059 Cough, unspecified: Secondary | ICD-10-CM | POA: Diagnosis not present

## 2021-06-12 LAB — RESP PANEL BY RT-PCR (FLU A&B, COVID) ARPGX2
Influenza A by PCR: NEGATIVE
Influenza B by PCR: NEGATIVE
SARS Coronavirus 2 by RT PCR: NEGATIVE

## 2021-06-12 MED ORDER — DEXAMETHASONE 10 MG/ML FOR PEDIATRIC ORAL USE
10.0000 mg | Freq: Once | INTRAMUSCULAR | Status: AC
  Administered 2021-06-12: 10 mg via ORAL
  Filled 2021-06-12: qty 1

## 2021-06-12 MED ORDER — PROMETHAZINE-DM 6.25-15 MG/5ML PO SYRP: 5.0000 mL | ORAL_SOLUTION | Freq: Four times a day (QID) | ORAL | 0 refills | Status: DC | PRN

## 2021-06-12 MED ORDER — AZITHROMYCIN 250 MG PO TABS: 250.0000 mg | ORAL_TABLET | Freq: Every day | ORAL | 0 refills | Status: AC

## 2021-06-12 NOTE — ED Triage Notes (Signed)
Approx 1 week ago began having "allergy" symptoms, has been taking usual meds without relief. Taking flonase, sinus meds all OTC. Has had some improvement, Thusday night began feeling worse, a lot of non-productive cough. Denies any fevers. Appetite good. Ill appearing.  ?

## 2021-06-12 NOTE — ED Notes (Signed)
AVS provided to client, informed of Rx x 2 sent to her pharmacy listed on the EMR, reinforced increasing PO fluids and to complete ABX as prescribed. Opportunity for questions provided prior to DC to home ?

## 2021-06-12 NOTE — ED Provider Notes (Signed)
[facility] Provider Note   CSN: [id number] Arrival date & time: 06/12/21  1307     History History of celiac disease, GERD, gastroparesis, and asthma. Chief Complaint  Patient presents with   Cough    Katelyn Mcclain is a 71 y.o. female. Patient presents the ED with chief complaint of cough.  She states for about a week she has been feeling bad.  She says about 1 week ago, she had some upper respiratory symptoms such as stuffiness, sore throat, and cough.  She says the symptoms started to get better throughout the week.  Yesterday, she started to feel much worse with a worsening cough.  She feels like she has congestion in her chest.  She has tightness in her chest whenever she coughs.  She denies any fevers or chills.  She denies chest pain, shortness of breath, nausea, vomiting, abdominal pain.   Cough Associated symptoms: sore throat   Associated symptoms: no chest pain, no chills, no fever and no shortness of breath       Home Medications Prior to Admission medications   Medication Sig Start Date End Date Taking? Authorizing Provider  azithromycin (ZITHROMAX Z-PAK) 250 MG tablet Take 1 tablet (250 mg total) by mouth daily for 5 days. 06/12/21 06/17/21 Yes Brinleigh Tew, Adora Fridge, PA-C  promethazine-dextromethorphan (PROMETHAZINE-DM) 6.25-15 MG/5ML syrup Take 5 mLs by mouth 4 (four) times daily as needed for cough. 06/12/21  Yes Jesslyn Viglione, Adora Fridge, PA-C  albuterol (PROVENTIL HFA;VENTOLIN HFA) 108 (90 BASE) MCG/ACT inhaler Inhale 1 puff into the lungs every 6 (six) hours as needed for wheezing or shortness of breath (seasonal allergies).     [provider]  albuterol (PROVENTIL) (2.5 MG/3ML) 0.083% nebulizer solution Take 3 mLs (2.5 mg total) by nebulization every 6 (six) hours as needed for wheezing or shortness of breath. 05/01/19   Nils Flack, Mina A, PA-C  b complex vitamins capsule Take by mouth.    [provider]  benzonatate (TESSALON) 100  MG capsule Take 1 capsule (100 mg total) by mouth 3 (three) times daily as needed for cough. 05/01/19   Fawze, Mina A, PA-C  Calcium-Magnesium-Vitamin D (CALCIUM 1200+D3 PO) Take 1,200 mg by mouth daily.    [provider]  Clobetasol Propionate (IMPOYZ) 0.025 % CREA Apply 1 application topically daily. 07/25/19   Clark-Burning, Anderson Malta, PA-C  conjugated estrogens (PREMARIN) vaginal cream Premarin 0.625 mg/gram vaginal cream  INSERT 0.5 APPLICATOR(S)FUL EVERY DAY BY VAGINAL ROUTE.    [provider]  docusate sodium (COLACE) 100 MG capsule Take 100 mg by mouth 2 (two) times daily.    [provider]  Eszopiclone 3 MG TABS Take 1.5 mg by mouth at bedtime as needed (sleep). Take immediately before bedtime    [provider]  FLUAD 0.5 ML SUSY TO BE ADMINISTERED BY PHARMACIST FOR IMMUNIZATION 06/21/18   [provider]  imiquimod (ALDARA) 5 % cream Apply topically at bedtime. 07/10/20   Lavonna Monarch, MD  Meth-Hyo-M Bl-Na Phos-Ph Sal (URO-MP) 118 MG CAPS TAKE 1 CAPSULE BY MOUTH TWICE A DAY AS NEEDED 05/04/19   McKenzie, Candee Furbish, MD  montelukast (SINGULAIR) 10 MG tablet Take 10 mg by mouth at bedtime.  11/03/13   [provider]  MYRBETRIQ 50 MG TB24 tablet TAKE 1 TABLET DAILY 07/02/19   McKenzie, Candee Furbish, MD  omeprazole (PRILOSEC) 40 MG capsule Take 40 mg by mouth daily. 10/10/16   [provider]  ondansetron (ZOFRAN) 4 MG tablet TAKE  1 TABLET BY MOUTH EVERY 6 HOURS AS NEEDED FOR NAUSEA - IF PHERERGAN NOT WORKING 07/30/17   [provider]  OVER THE COUNTER MEDICATION Take 500 mg by mouth daily. Vitamin B5    [provider]  pravastatin (PRAVACHOL) 10 MG tablet Take 10 mg by mouth 3 (three) times a week.    [provider]  Probiotic Product (PROBIOTIC DAILY PO) Take 1 tablet by mouth 2 (two) times daily.     [provider]  promethazine (PHENERGAN) 25 MG tablet Take 1 tablet (25 mg total) by mouth every 6  (six) hours as needed for nausea or vomiting. 11/30/15   Lawyer, Harrell Gave, PA-C  pyridoxine (B-6) 200 MG tablet Take 200 mg by mouth daily.    [provider]  rizatriptan (MAXALT-MLT) 10 MG disintegrating tablet Take one tablet at the onset of migraine and repeat in 2 hours if needed. 12/08/20   Ward Givens, NP  vortioxetine HBr (TRINTELLIX) 20 MG TABS tablet Take 20 mg by mouth at bedtime.    [provider]  zonisamide (ZONEGRAN) 100 MG capsule Take 1 capsule (100 mg total) by mouth 2 (two) times daily. 12/08/20   Ward Givens, NP      Allergies    Adhesive [tape], Morphine and related, Barley grass, Clonazepam, Linaclotide, Metronidazole, Oatmeal, Other, and Wheat bran    Review of Systems   Review of Systems  Constitutional:  Negative for chills and fever.  HENT:  Positive for congestion and sore throat.   Respiratory:  Positive for cough and chest tightness. Negative for shortness of breath.   Cardiovascular:  Negative for chest pain and leg swelling.  Gastrointestinal:  Negative for abdominal pain, nausea and vomiting.  All other systems reviewed and are negative.  Physical Exam Updated Vital Signs BP (!) 141/94    Pulse 75    Temp 97.8 F (36.6 C) (Oral)    Resp 18    Ht 5' 10"  (1.778 m)    Wt 77.1 kg    SpO2 99%    BMI 24.39 kg/m  Physical Exam Vitals and nursing note reviewed.  Constitutional:      General: She is not in acute distress.    Appearance: Normal appearance. She is not ill-appearing, toxic-appearing or diaphoretic.  HENT:     Head: Normocephalic and atraumatic.     Nose: No nasal deformity.     Mouth/Throat:     Lips: Pink. No lesions.     Mouth: Mucous membranes are moist. No injury, lacerations, oral lesions or angioedema.     Pharynx: Oropharynx is clear. Uvula midline. No pharyngeal swelling, oropharyngeal exudate, posterior oropharyngeal erythema or uvula swelling.  Eyes:     General: Gaze aligned appropriately. No scleral  icterus.       Right eye: No discharge.        Left eye: No discharge.     Conjunctiva/sclera: Conjunctivae normal.     Right eye: Right conjunctiva is not injected. No exudate or hemorrhage.    Left eye: Left conjunctiva is not injected. No exudate or hemorrhage.    Pupils: Pupils are equal, round, and reactive to light.  Cardiovascular:     Rate and Rhythm: Normal rate and regular rhythm.     Pulses: Normal pulses.          Radial pulses are 2+ on the right side and 2+ on the left side.       Dorsalis pedis pulses are 2+ on the right  side and 2+ on the left side.     Heart sounds: Normal heart sounds, S1 normal and S2 normal. Heart sounds not distant. No murmur heard.   No friction rub. No gallop. No S3 or S4 sounds.  Pulmonary:     Effort: Pulmonary effort is normal. No accessory muscle usage or respiratory distress.     Breath sounds: Normal breath sounds. No stridor. No wheezing, rhonchi or rales.  Chest:     Chest wall: No tenderness.  Abdominal:     General: Abdomen is flat. Bowel sounds are normal. There is no distension.     Palpations: Abdomen is soft. There is no mass or pulsatile mass.     Tenderness: There is no abdominal tenderness. There is no guarding or rebound.  Musculoskeletal:     Right lower leg: No edema.     Left lower leg: No edema.  Skin:    General: Skin is warm and dry.     Coloration: Skin is not jaundiced or pale.     Findings: No bruising, erythema, lesion or rash.  Neurological:     General: No focal deficit present.     Mental Status: She is alert and oriented to person, place, and time.     GCS: GCS eye subscore is 4. GCS verbal subscore is 5. GCS motor subscore is 6.  Psychiatric:        Mood and Affect: Mood normal.        Behavior: Behavior normal. Behavior is cooperative.    ED Results / Procedures / Treatments   Labs (all labs ordered are listed, but only abnormal results are displayed) Labs Reviewed  RESP PANEL BY RT-PCR (FLU A&B,  COVID) [id number]    EKG None  Radiology DG Chest 2 View  Result Date: 06/12/2021 CLINICAL DATA:  Cough for 1 week, nonproductive. EXAM: CHEST - 2 VIEW COMPARISON:  Chest radiograph dated June 14, 2020 FINDINGS: The heart size and mediastinal contours are within normal limits. Both lungs are clear. The visualized skeletal structures are unremarkable. IMPRESSION: No active cardiopulmonary disease. Electronically Signed   By: Keane Police D.O.   On: 06/12/2021 14:10    Procedures Procedures   Medications Ordered in ED Medications  dexamethasone (DECADRON) 10 MG/ML injection for Pediatric ORAL use 10 mg (10 mg Oral Given 06/12/21 1557)    ED Course/ Medical Decision Making/ A&P                           Medical Decision Making Risk Prescription drug management.    MDM  This is a 71 y.o. female with a pertinent PMH of asthma, celiac disease, gastroparesis, GERD who presents to the ED with worsening cough and chest congestion.  She had a recent URI that seemed to get better, however she is now feeling worse.  Symptoms similar to previous chronic bronchitis flares.  Has normal vitals.  She looks like she does not feel well, however is in no acute distress. Lung sounds are clear.  Exam is completely unremarkable.  My Impression, Plan, and ED Course: This is likely bronchitis versus pneumonia I personally ordered, reviewed, and interpreted all laboratory work and imaging and agree with radiologist interpretation. Results interpreted below: COVID and flu are negative.  Chest x-ray is normal.  This is likely bronchitis.  Will treat with steroids here.  Patient has inhalers at home.  We will also give azithromycin Dosepak since she is over 82 and  symptoms have bene on going for a week.    Charting Requirements Additional history is obtained from:  Independent historian External Records from outside source obtained and reviewed including: n/a Social Determinants of Health:  none Pertinant PMH  that complicates patient's illness: asthma  Patient Care Problems that were addressed during this visit: - Bronchitis: Acute illness with complication Disposition: abd, steroids, inhalers, cough meds, return precautions  Portions of this note were generated with Dragon dictation software. Dictation errors may occur despite best attempts at proofreading.  Final Clinical Impression(s) / ED Diagnoses Final diagnoses:  Acute bronchitis, unspecified organism    Rx / DC Orders ED Discharge Orders          Ordered    azithromycin (ZITHROMAX Z-PAK) 250 MG tablet  Daily        06/12/21 1557    promethazine-dextromethorphan (PROMETHAZINE-DM) 6.25-15 MG/5ML syrup  4 times daily PRN        06/12/21 1557              Tanieka Pownall, Adora Fridge, PA-C 06/12/21 1858    Fredia Sorrow, MD 06/15/21 [phone]

## 2021-06-17 DIAGNOSIS — M25551 Pain in right hip: Secondary | ICD-10-CM | POA: Diagnosis not present

## 2021-06-17 DIAGNOSIS — M1611 Unilateral primary osteoarthritis, right hip: Secondary | ICD-10-CM | POA: Diagnosis not present

## 2021-06-17 DIAGNOSIS — G9331 Postviral fatigue syndrome: Secondary | ICD-10-CM | POA: Diagnosis not present

## 2021-06-17 DIAGNOSIS — R058 Other specified cough: Secondary | ICD-10-CM | POA: Diagnosis not present

## 2021-06-17 DIAGNOSIS — J40 Bronchitis, not specified as acute or chronic: Secondary | ICD-10-CM | POA: Diagnosis not present

## 2021-06-21 DIAGNOSIS — M810 Age-related osteoporosis without current pathological fracture: Secondary | ICD-10-CM | POA: Diagnosis not present

## 2021-06-29 ENCOUNTER — Ambulatory Visit (INDEPENDENT_AMBULATORY_CARE_PROVIDER_SITE_OTHER): Payer: Medicare Other | Admitting: Dermatology

## 2021-06-29 ENCOUNTER — Encounter: Payer: Self-pay | Admitting: Dermatology

## 2021-06-29 ENCOUNTER — Other Ambulatory Visit: Payer: Self-pay | Admitting: Dermatology

## 2021-06-29 ENCOUNTER — Other Ambulatory Visit: Payer: Self-pay

## 2021-06-29 DIAGNOSIS — L820 Inflamed seborrheic keratosis: Secondary | ICD-10-CM | POA: Diagnosis not present

## 2021-06-29 DIAGNOSIS — D485 Neoplasm of uncertain behavior of skin: Secondary | ICD-10-CM | POA: Diagnosis not present

## 2021-06-29 DIAGNOSIS — M25551 Pain in right hip: Secondary | ICD-10-CM | POA: Diagnosis not present

## 2021-06-29 DIAGNOSIS — Z1283 Encounter for screening for malignant neoplasm of skin: Secondary | ICD-10-CM

## 2021-06-29 DIAGNOSIS — L309 Dermatitis, unspecified: Secondary | ICD-10-CM | POA: Diagnosis not present

## 2021-06-29 DIAGNOSIS — D1801 Hemangioma of skin and subcutaneous tissue: Secondary | ICD-10-CM | POA: Diagnosis not present

## 2021-06-29 DIAGNOSIS — D1800 Hemangioma unspecified site: Secondary | ICD-10-CM

## 2021-06-29 MED ORDER — TACROLIMUS 0.1 % EX OINT: TOPICAL_OINTMENT | Freq: Two times a day (BID) | CUTANEOUS | 2 refills | Status: DC

## 2021-06-29 NOTE — Patient Instructions (Signed)
?   Over the counter ?Triple paste or triple paste af ?

## 2021-07-07 DIAGNOSIS — N3010 Interstitial cystitis (chronic) without hematuria: Secondary | ICD-10-CM | POA: Diagnosis not present

## 2021-07-14 DIAGNOSIS — N3010 Interstitial cystitis (chronic) without hematuria: Secondary | ICD-10-CM | POA: Diagnosis not present

## 2021-07-14 DIAGNOSIS — R109 Unspecified abdominal pain: Secondary | ICD-10-CM | POA: Diagnosis not present

## 2021-07-18 ENCOUNTER — Telehealth: Payer: Self-pay

## 2021-07-18 ENCOUNTER — Telehealth: Payer: Self-pay | Admitting: Dermatology

## 2021-07-18 NOTE — Telephone Encounter (Signed)
Phone call to patient to inform her that she can call [facility] regarding electrolysis.  ?

## 2021-07-18 NOTE — Telephone Encounter (Signed)
Patient is calling to find out where she can go to for electrolysis. ?

## 2021-07-18 NOTE — Telephone Encounter (Signed)
Katelyn Mcclain c Haecker [name] - PA Case ID: [id number] - Rx #: [id number] Need help? Call us at [phone] ?Outcome ? ? ?Approvedtoday ?CSHRXU:[id number];KDCMME:EGHIVRAL;Review Type:Prior Auth;Coverage Start Date:06/18/2021;Coverage End Date:07/18/2022; ?Drug ?Tacrolimus 0.1% ointment ? ?

## 2021-07-20 ENCOUNTER — Encounter: Payer: Self-pay | Admitting: Dermatology

## 2021-07-20 DIAGNOSIS — M25551 Pain in right hip: Secondary | ICD-10-CM | POA: Diagnosis not present

## 2021-07-20 NOTE — Progress Notes (Signed)
? ?  Follow-Up Visit ?  ?Subjective  ?Katelyn Mcclain is a 71 y.o. female who presents for the following: Annual Exam (No  new concerns). ? ?History of change in color spot right leg, general skin check ?Location:  ?Duration:  ?Quality:  ?Associated Signs/Symptoms: ?Modifying Factors:  ?Severity:  ?Timing: ?Context:  ? ?Objective  ?Well appearing patient in no apparent distress; mood and affect are within normal limits. ?Full body skin examination: History of changes of lesion on right thigh.  No other suspicious lesions. ? ?Head - Anterior (Face) ?Subtle erythema and scale compatible with seborrheic dermatitis ? ?Right Thigh - Anterior ?Monochrome Black 5 mm macule; amorphous dermoscopy suggest thrombosed lesion, melanocytic. ? ? ? ? ? ? ? ? ?A full examination was performed including scalp, head, eyes, ears, nose, lips, neck, chest, axillae, abdomen, back, buttocks, bilateral upper extremities, bilateral lower extremities, hands, feet, fingers, toes, fingernails, and toenails. All findings within normal limits unless otherwise noted below.  Areas beneath undergarments not fully examined. ? ? ?Assessment & Plan  ? ? ?Dermatitis ?Head - Anterior (Face) ? ?Topical tacrolimus ointment nightly for 2 to 3 weeks, taper if improves ? ?tacrolimus (PROTOPIC) 0.1 % ointment - Head - Anterior (Face) ?Apply topically 2 (two) times daily. ? ?Encounter for screening for malignant neoplasm of skin ? ?Annual skin examination, encouraged to self examine twice annually.  We will biopsy leg lesion. ? ?Seborrheic keratosis, inflamed ?Right Lower Leg - Anterior ? ?Destruction of lesion - Right Lower Leg - Anterior ?Complexity: simple   ?Destruction method: cryotherapy   ?Informed consent: discussed and consent obtained   ?Timeout:  patient name, date of birth, surgical site, and procedure verified ?Lesion destroyed using liquid nitrogen: Yes   ?Cryotherapy cycles:  3 ?Outcome: patient tolerated procedure well with no complications    ? ?Neoplasm of uncertain behavior of skin ?Right Thigh - Anterior ? ?Skin / nail biopsy ?Type of biopsy: tangential   ?Informed consent: discussed and consent obtained   ?Timeout: patient name, date of birth, surgical site, and procedure verified   ?Procedure prep:  Patient was prepped and draped in usual sterile fashion (Non sterile) ?Prep type:  Chlorhexidine ?Anesthesia: the lesion was anesthetized in a standard fashion   ?Anesthetic:  1% lidocaine w/ epinephrine 1-100,000 local infiltration ?Instrument used: flexible razor blade   ?Outcome: patient tolerated procedure well   ?Post-procedure details: wound care instructions given   ? ?Specimen 1 - Surgical pathology ?Differential Diagnosis: r/o angioma ? ?Check Margins: No ? ? ? ? ? ?I, Lavonna Monarch, MD, have reviewed all documentation for this visit.  The documentation on 07/20/21 for the exam, diagnosis, procedures, and orders are all accurate and complete. ?

## 2021-08-19 DIAGNOSIS — M25551 Pain in right hip: Secondary | ICD-10-CM | POA: Diagnosis not present

## 2021-08-19 DIAGNOSIS — M1611 Unilateral primary osteoarthritis, right hip: Secondary | ICD-10-CM | POA: Diagnosis not present

## 2021-08-23 DIAGNOSIS — Z01818 Encounter for other preprocedural examination: Secondary | ICD-10-CM | POA: Diagnosis not present

## 2021-08-25 DIAGNOSIS — Z01818 Encounter for other preprocedural examination: Secondary | ICD-10-CM | POA: Diagnosis not present

## 2021-08-25 DIAGNOSIS — K219 Gastro-esophageal reflux disease without esophagitis: Secondary | ICD-10-CM | POA: Diagnosis not present

## 2021-08-25 DIAGNOSIS — E782 Mixed hyperlipidemia: Secondary | ICD-10-CM | POA: Diagnosis not present

## 2021-08-25 DIAGNOSIS — F411 Generalized anxiety disorder: Secondary | ICD-10-CM | POA: Diagnosis not present

## 2021-08-25 DIAGNOSIS — G43909 Migraine, unspecified, not intractable, without status migrainosus: Secondary | ICD-10-CM | POA: Diagnosis not present

## 2021-08-25 DIAGNOSIS — M810 Age-related osteoporosis without current pathological fracture: Secondary | ICD-10-CM | POA: Diagnosis not present

## 2021-08-25 DIAGNOSIS — G629 Polyneuropathy, unspecified: Secondary | ICD-10-CM | POA: Diagnosis not present

## 2021-08-25 DIAGNOSIS — M159 Polyosteoarthritis, unspecified: Secondary | ICD-10-CM | POA: Diagnosis not present

## 2021-08-25 DIAGNOSIS — K900 Celiac disease: Secondary | ICD-10-CM | POA: Diagnosis not present

## 2021-08-30 DIAGNOSIS — M25551 Pain in right hip: Secondary | ICD-10-CM | POA: Diagnosis not present

## 2021-08-30 DIAGNOSIS — M1611 Unilateral primary osteoarthritis, right hip: Secondary | ICD-10-CM | POA: Diagnosis not present

## 2021-09-09 DIAGNOSIS — M1611 Unilateral primary osteoarthritis, right hip: Secondary | ICD-10-CM | POA: Diagnosis not present

## 2021-09-21 DIAGNOSIS — M722 Plantar fascial fibromatosis: Secondary | ICD-10-CM | POA: Diagnosis not present

## 2021-09-21 DIAGNOSIS — M810 Age-related osteoporosis without current pathological fracture: Secondary | ICD-10-CM | POA: Diagnosis not present

## 2021-09-21 DIAGNOSIS — E782 Mixed hyperlipidemia: Secondary | ICD-10-CM | POA: Diagnosis not present

## 2021-09-21 DIAGNOSIS — M79671 Pain in right foot: Secondary | ICD-10-CM | POA: Diagnosis not present

## 2021-09-21 DIAGNOSIS — K900 Celiac disease: Secondary | ICD-10-CM | POA: Diagnosis not present

## 2021-09-21 DIAGNOSIS — M79672 Pain in left foot: Secondary | ICD-10-CM | POA: Diagnosis not present

## 2021-09-28 DIAGNOSIS — M159 Polyosteoarthritis, unspecified: Secondary | ICD-10-CM | POA: Diagnosis not present

## 2021-09-28 DIAGNOSIS — M810 Age-related osteoporosis without current pathological fracture: Secondary | ICD-10-CM | POA: Diagnosis not present

## 2021-09-28 DIAGNOSIS — K900 Celiac disease: Secondary | ICD-10-CM | POA: Diagnosis not present

## 2021-09-28 DIAGNOSIS — K219 Gastro-esophageal reflux disease without esophagitis: Secondary | ICD-10-CM | POA: Diagnosis not present

## 2021-09-28 DIAGNOSIS — F411 Generalized anxiety disorder: Secondary | ICD-10-CM | POA: Diagnosis not present

## 2021-09-28 DIAGNOSIS — G629 Polyneuropathy, unspecified: Secondary | ICD-10-CM | POA: Diagnosis not present

## 2021-09-28 DIAGNOSIS — E782 Mixed hyperlipidemia: Secondary | ICD-10-CM | POA: Diagnosis not present

## 2021-09-28 DIAGNOSIS — G43909 Migraine, unspecified, not intractable, without status migrainosus: Secondary | ICD-10-CM | POA: Diagnosis not present

## 2021-10-04 DIAGNOSIS — M1611 Unilateral primary osteoarthritis, right hip: Secondary | ICD-10-CM | POA: Diagnosis not present

## 2021-10-21 DIAGNOSIS — N3010 Interstitial cystitis (chronic) without hematuria: Secondary | ICD-10-CM | POA: Diagnosis not present

## 2021-11-06 ENCOUNTER — Other Ambulatory Visit: Payer: Self-pay | Admitting: Adult Health

## 2021-11-08 DIAGNOSIS — M25572 Pain in left ankle and joints of left foot: Secondary | ICD-10-CM | POA: Diagnosis not present

## 2021-11-08 DIAGNOSIS — Z96641 Presence of right artificial hip joint: Secondary | ICD-10-CM | POA: Diagnosis not present

## 2021-11-08 DIAGNOSIS — Z471 Aftercare following joint replacement surgery: Secondary | ICD-10-CM | POA: Diagnosis not present

## 2021-11-08 DIAGNOSIS — M25571 Pain in right ankle and joints of right foot: Secondary | ICD-10-CM | POA: Diagnosis not present

## 2021-11-08 DIAGNOSIS — M6281 Muscle weakness (generalized): Secondary | ICD-10-CM | POA: Diagnosis not present

## 2021-11-15 DIAGNOSIS — M6281 Muscle weakness (generalized): Secondary | ICD-10-CM | POA: Diagnosis not present

## 2021-11-15 DIAGNOSIS — M25572 Pain in left ankle and joints of left foot: Secondary | ICD-10-CM | POA: Diagnosis not present

## 2021-11-15 DIAGNOSIS — M25571 Pain in right ankle and joints of right foot: Secondary | ICD-10-CM | POA: Diagnosis not present

## 2021-11-15 DIAGNOSIS — Z96641 Presence of right artificial hip joint: Secondary | ICD-10-CM | POA: Diagnosis not present

## 2021-11-18 DIAGNOSIS — M6281 Muscle weakness (generalized): Secondary | ICD-10-CM | POA: Diagnosis not present

## 2021-11-18 DIAGNOSIS — M25572 Pain in left ankle and joints of left foot: Secondary | ICD-10-CM | POA: Diagnosis not present

## 2021-11-18 DIAGNOSIS — Z96641 Presence of right artificial hip joint: Secondary | ICD-10-CM | POA: Diagnosis not present

## 2021-11-18 DIAGNOSIS — M25571 Pain in right ankle and joints of right foot: Secondary | ICD-10-CM | POA: Diagnosis not present

## 2021-11-21 DIAGNOSIS — Z96641 Presence of right artificial hip joint: Secondary | ICD-10-CM | POA: Diagnosis not present

## 2021-11-21 DIAGNOSIS — M25572 Pain in left ankle and joints of left foot: Secondary | ICD-10-CM | POA: Diagnosis not present

## 2021-11-21 DIAGNOSIS — M25571 Pain in right ankle and joints of right foot: Secondary | ICD-10-CM | POA: Diagnosis not present

## 2021-11-21 DIAGNOSIS — M6281 Muscle weakness (generalized): Secondary | ICD-10-CM | POA: Diagnosis not present

## 2021-11-25 DIAGNOSIS — M6281 Muscle weakness (generalized): Secondary | ICD-10-CM | POA: Diagnosis not present

## 2021-11-25 DIAGNOSIS — M25571 Pain in right ankle and joints of right foot: Secondary | ICD-10-CM | POA: Diagnosis not present

## 2021-11-25 DIAGNOSIS — Z96641 Presence of right artificial hip joint: Secondary | ICD-10-CM | POA: Diagnosis not present

## 2021-11-25 DIAGNOSIS — M25572 Pain in left ankle and joints of left foot: Secondary | ICD-10-CM | POA: Diagnosis not present

## 2021-11-28 DIAGNOSIS — M25571 Pain in right ankle and joints of right foot: Secondary | ICD-10-CM | POA: Diagnosis not present

## 2021-11-28 DIAGNOSIS — M6281 Muscle weakness (generalized): Secondary | ICD-10-CM | POA: Diagnosis not present

## 2021-11-28 DIAGNOSIS — Z96641 Presence of right artificial hip joint: Secondary | ICD-10-CM | POA: Diagnosis not present

## 2021-11-28 DIAGNOSIS — M25572 Pain in left ankle and joints of left foot: Secondary | ICD-10-CM | POA: Diagnosis not present

## 2021-12-01 ENCOUNTER — Ambulatory Visit (INDEPENDENT_AMBULATORY_CARE_PROVIDER_SITE_OTHER): Payer: Medicare Other | Admitting: Adult Health

## 2021-12-01 ENCOUNTER — Encounter: Payer: Self-pay | Admitting: Adult Health

## 2021-12-01 VITALS — BP 140/86 | HR 76 | Ht 70.0 in | Wt 173.0 lb

## 2021-12-01 DIAGNOSIS — G43009 Migraine without aura, not intractable, without status migrainosus: Secondary | ICD-10-CM

## 2021-12-01 DIAGNOSIS — M6281 Muscle weakness (generalized): Secondary | ICD-10-CM | POA: Diagnosis not present

## 2021-12-01 DIAGNOSIS — Z96641 Presence of right artificial hip joint: Secondary | ICD-10-CM | POA: Diagnosis not present

## 2021-12-01 DIAGNOSIS — M25571 Pain in right ankle and joints of right foot: Secondary | ICD-10-CM | POA: Diagnosis not present

## 2021-12-01 DIAGNOSIS — M25572 Pain in left ankle and joints of left foot: Secondary | ICD-10-CM | POA: Diagnosis not present

## 2021-12-01 NOTE — Progress Notes (Signed)
PATIENT: Katelyn Mcclain DOB: 05-Feb-1951  REASON FOR VISIT: follow up HISTORY FROM: patient  Chief Complaint  Patient presents with   Follow-up    Pt in 19 Pt here for Migraine f/u Pt states several migraines in the last couple migraines Pt states pain level is 2      HISTORY OF PRESENT ILLNESS: Today 12/01/21:  [name] is a 71 year old female with a history of migraine headaches and paresthesias in the lower extremities.  She returns today for follow-up.  She remains on Zonegran 100 mg BID for prevention and Maxalt for abortive therapy.  She reports that she is has had an ongoing headache for the last 3 days. Report that headache is a 2/10 on pain scale. Reports that headaches have been manageable and well controlled. Had hip surgery and since then has had different symptoms should as heat intolerance, BP issues, cold all the time. Reports that she still has paresthesias and has plantar facitis and is in PT. Feels tingling in the toes left is worse than right.  Had NCV/EMG in 2019. Currently on Gabapentin.     NCV/ EMG IMPRESSION:   Nerve conduction studies done on both lower extremities were notable for mild sensory latency abnormalities suggestive of an early peripheral neuropathy.  EMG evaluation of the right lower extremity was relatively unremarkable, no evidence of an overlying lumbosacral radiculopathy was seen.  12/01/20: [name] is a 71 year old female with a history of migraine headaches and paresthesias in the lower extremities.  She returns today for follow-up.  Overall she feels that her headaches have remained relatively stable.  She continues to use Maxalt as an abortive therapy and Zonegran 100 mg daily for prevention.  Reports that she is not even having 1 headache a month now.  Numbness in the lower extremities have remained relatively stable.  Reports that she seen orthopedics for Planter fasciitis. no changes in her gait or balance.  12/01/19: [name] is  a 71 year old female with a history of migraine headaches and paresthesias in the lower extremities.  She returns today for follow-up.  She states that her headaches have remained relatively stable.  She has approximately 1 headache a month.  Triggers her barometric pressure.  She does state that she travel to the [place] and this made her headaches worse.  She also has a history of Mnire's disease.  She states that Maxalt is beneficial for her headaches.  She continues to have numbness in the lower extremities.  Tried and failed gabapentin.  No changes in her gait or balance.  HISTORY 06/02/19:   [name] is a 71 year old female with a history of migraine headaches and paresthesias in the lower extremities.  She returns today for follow-up.  She reports that her headaches have increased in the last month or so.  She feels that this is related to the weather.  She states that she is using more Maxalt.  Approximately 2 to 4 tablets a week.  She reduced her Zonegran dose to 100 mg twice a day.  Ultimately she would like to continue to reduce the dose.  She continues to have numbness in the toes.  Nerve conduction studies did reveal a early neuropathy.  She denies any discomfort.  Denies any changes with her gait or balance.  She tried gabapentin but did not like this medication.  She returns today for evaluation  REVIEW OF SYSTEMS: Out of a complete 14 system review of symptoms, the patient complains only of  the following symptoms, and all other reviewed systems are negative.  See HPI  ALLERGIES: Allergies  Allergen Reactions   Adhesive [Tape] Other (See Comments)    Tear's skin off    Morphine And Related Other (See Comments)    Sensitive to narcotics, worsening migraines.   Barley Grass Other (See Comments)    Celiac disease   Also rye   Clonazepam Other (See Comments)    Caused anxiety   Linaclotide Other (See Comments)    Caused spastic bladder (reaction to Linzess)   Metronidazole  Other (See Comments)    Black tongue and diarrhea   Oatmeal Other (See Comments)    Celiac disease   Other     Pt does not use artificial sweeteners  Now has anaphylactic allergic to allergy shots due to COVID     Wheat Bran Other (See Comments)    Celiac disease    HOME MEDICATIONS: Outpatient Medications Prior to Visit  Medication Sig Dispense Refill   albuterol (PROVENTIL HFA;VENTOLIN HFA) 108 (90 BASE) MCG/ACT inhaler Inhale 1 puff into the lungs every 6 (six) hours as needed for wheezing or shortness of breath (seasonal allergies).      albuterol (PROVENTIL) (2.5 MG/3ML) 0.083% nebulizer solution Take 3 mLs (2.5 mg total) by nebulization every 6 (six) hours as needed for wheezing or shortness of breath. 75 mL 0   b complex vitamins capsule Take by mouth.     benzonatate (TESSALON) 100 MG capsule Take 1 capsule (100 mg total) by mouth 3 (three) times daily as needed for cough. 21 capsule 0   Calcium-Magnesium-Vitamin D (CALCIUM 1200+D3 PO) Take 1,200 mg by mouth daily.     Clobetasol Propionate (IMPOYZ) 0.025 % CREA Apply 1 application topically daily. 60 g 0   docusate sodium (COLACE) 100 MG capsule Take 100 mg by mouth 2 (two) times daily.     Eszopiclone 3 MG TABS Take 1.5 mg by mouth at bedtime as needed (sleep). Take immediately before bedtime     FLUAD 0.5 ML SUSY TO BE ADMINISTERED BY PHARMACIST FOR IMMUNIZATION     gabapentin (NEURONTIN) 100 MG capsule Take 1-3 capsules at night     imiquimod (ALDARA) 5 % cream Apply topically at bedtime. 12 each 0   Meth-Hyo-M Bl-Na Phos-Ph Sal (URO-MP) 118 MG CAPS TAKE 1 CAPSULE BY MOUTH TWICE A DAY AS NEEDED 30 capsule 5   montelukast (SINGULAIR) 10 MG tablet Take 10 mg by mouth at bedtime.      MYRBETRIQ 50 MG TB24 tablet TAKE 1 TABLET DAILY 90 tablet 3   omeprazole (PRILOSEC) 40 MG capsule Take 40 mg by mouth daily.     ondansetron (ZOFRAN) 4 MG tablet TAKE 1 TABLET BY MOUTH EVERY 6 HOURS AS NEEDED FOR NAUSEA - IF PHERERGAN NOT WORKING   2   OVER THE COUNTER MEDICATION Take 500 mg by mouth daily. Vitamin B5     pravastatin (PRAVACHOL) 10 MG tablet Take 10 mg by mouth 3 (three) times a week.     Probiotic Product (PROBIOTIC DAILY PO) Take 1 tablet by mouth 2 (two) times daily.      promethazine (PHENERGAN) 25 MG tablet Take 1 tablet (25 mg total) by mouth every 6 (six) hours as needed for nausea or vomiting. 10 tablet 0   promethazine-dextromethorphan (PROMETHAZINE-DM) 6.25-15 MG/5ML syrup Take 5 mLs by mouth 4 (four) times daily as needed for cough. 118 mL 0   pyridoxine (B-6) 200 MG tablet Take 200 mg by mouth  daily.     rizatriptan (MAXALT-MLT) 10 MG disintegrating tablet TAKE 1 TABLET AT ONSET OF MIGRAINE AND REPEAT IN 2 HOURS IF NEEDED 27 tablet 3   tacrolimus (PROTOPIC) 0.1 % ointment Apply topically 2 (two) times daily. 100 g 2   Vibegron (GEMTESA) 75 MG TABS Take by mouth.     vortioxetine HBr (TRINTELLIX) 20 MG TABS tablet Take 20 mg by mouth at bedtime.     zonisamide (ZONEGRAN) 100 MG capsule Take 1 capsule (100 mg total) by mouth 2 (two) times daily. 180 capsule 3   conjugated estrogens (PREMARIN) vaginal cream Premarin 0.625 mg/gram vaginal cream  INSERT 0.5 APPLICATOR(S)FUL EVERY DAY BY VAGINAL ROUTE.     Facility-Administered Medications Prior to Visit  Medication Dose Route Frequency Provider Last Rate Last Admin   ipratropium (ATROVENT) nebulizer solution 0.5 mg  0.5 mg Nebulization Once Rise Mu, PA-C        PAST MEDICAL HISTORY: Past Medical History:  Diagnosis Date   Abscessed tooth    Allergic rhinitis    Asthma    Asthma    inhaler prn   Celiac disease    Depression with anxiety    Dysmenorrhea    Gastroparesis    GERD (gastroesophageal reflux disease)    Hx of low back pain    IBS (irritable bowel syndrome)    Migraine    Migraine without aura, with intractable migraine, so stated, without mention of status migrainosus 05/20/2013   Osteopenia     PAST SURGICAL HISTORY: Past Surgical  History:  Procedure Laterality Date   ABDOMINAL SURGERY     BREAST BIOPSY Right    x2   BREAST CYST ASPIRATION  1999   COLONOSCOPY     FOOT SURGERY Left    Bunionectomy   MASS EXCISION  08/08/2011   Procedure: MINOR EXCISION OF MASS;  Surgeon: Cammie Sickle., MD;  Location: [facility];  Service: Orthopedics;  Laterality: Left;  left ring excision cyst proximal phalangeal joint   NISSEN FUNDOPLICATION     ROTATOR CUFF REPAIR Right 05/2015   TONSILLECTOMY      FAMILY HISTORY: Family History  Problem Relation Age of Onset   Stroke Father    Dementia Father    Atrial fibrillation Father    Cancer Father    Hypertension Mother    Scoliosis Mother    Heart disease Sister        Congenital heart disease   Breast cancer Maternal Grandmother    Migraines Neg Hx     SOCIAL HISTORY: Social History   Socioeconomic History   Marital status: Married    Spouse name: Not on file   Number of children: 0   Years of education: college   Highest education level: Not on file  Occupational History    Employer: [employer]    Comment: [employer]  Tobacco Use   Smoking status: Former   Smokeless tobacco: Never   Tobacco comments:    IN college 1 year  Vaping Use   Vaping Use: Never used  Substance and Sexual Activity   Alcohol use: Not Currently    Comment: wine occasionally   Drug use: No   Sexual activity: Yes    Birth control/protection: Post-menopausal  Other Topics Concern   Not on file  Social History Narrative   Patient lives at home with her husband Hoy Morn).   Patient is retired.   Education college.   Right handed.  Caffeine:  decaf maybe 1 cup every other month   Social Determinants of Health   Financial Resource Strain: Not on file  Food Insecurity: Not on file  Transportation Needs: Not on file  Physical Activity: Not on file  Stress: Not on file  Social Connections: Not on file  Intimate Partner Violence: Not on file       PHYSICAL EXAM  Vitals:   12/01/21 1113  BP: (!) 140/86  Pulse: 76  Weight: 173 lb (78.5 kg)  Height: 5' 10"  (1.778 m)     Body mass index is 24.82 kg/m.  Generalized: Well developed, in no acute distress   Neurological examination  Mentation: Alert oriented to time, place, history taking. Follows all commands speech and language fluent Cranial nerve II-XII:  Extraocular movements were full, visual field were full on confrontational test.  Head turning and shoulder shrug  were normal and symmetric. Motor: The motor testing reveals 5 over 5 strength of all 4 extremities. Good symmetric motor tone is noted throughout.  Sensory: Sensory testing is intact to soft touch on all 4 extremities. No evidence of extinction is noted.  Coordination: Cerebellar testing reveals good finger-nose-finger and heel-to-shin bilaterally.  Gait and station: Gait is normal.  Reflexes: Deep tendon reflexes are symmetric and normal bilaterally.   DIAGNOSTIC DATA (LABS, IMAGING, TESTING) - I reviewed patient records, labs, notes, testing and imaging myself where available.  Lab Results  Component Value Date   WBC 26.3 (H) 05/01/2019   HGB 14.3 05/01/2019   HCT 42.7 05/01/2019   MCV 91.8 05/01/2019   PLT 477 (H) 05/01/2019      Component Value Date/Time   NA 136 05/01/2019 1049   K 4.1 05/01/2019 1049   CL 106 05/01/2019 1049   CO2 21 (L) 05/01/2019 1049   GLUCOSE 98 05/01/2019 1049   BUN 34 (H) 05/01/2019 1049   CREATININE 1.02 (H) 05/01/2019 1049   CALCIUM 8.6 (L) 05/01/2019 1049   PROT 6.5 05/01/2019 1049   ALBUMIN 3.3 (L) 05/01/2019 1049   AST 45 (H) 05/01/2019 1049   ALT 79 (H) 05/01/2019 1049   ALKPHOS 72 05/01/2019 1049   BILITOT 0.5 05/01/2019 1049   GFRNONAA 57 (L) 05/01/2019 1049   GFRAA >60 05/01/2019 1049    Lab Results  Component Value Date   OZYYQMGN00 370 12/25/2017   Lab Results  Component Value Date   TSH 1.138 10/01/2017      ASSESSMENT AND PLAN 71  y.o. year old female  has a past medical history of Abscessed tooth, Allergic rhinitis, Asthma, Asthma, Celiac disease, Depression with anxiety, Dysmenorrhea, Gastroparesis, GERD (gastroesophageal reflux disease), low back pain, IBS (irritable bowel syndrome), Migraine, Migraine without aura, with intractable migraine, so stated, without mention of status migrainosus (05/20/2013), and Osteopenia. here with:  1.  Migraine headaches  Continue Zonegran 100 mg twice a day for preventative therapy Continue Maxalt for abortive therapy  2.  Paresthesias in the lower extremities  Continue to monitor On gabapentin (not prescribed by [place])  Advised that her symptoms worsen or she develops new symptoms she should let us know Follow-up in 1 year or sooner if needed   Ward Givens, MSN, NP-C 12/01/2021, 11:11 AM [facility] [address], [place], [place] [address] [phone]

## 2021-12-01 NOTE — Patient Instructions (Signed)
Your Plan:  Continue Zonegran and maxalt     Thank you for coming to see [place] at [facility]. I hope we have been able to provide you high quality care today.  You may receive a patient satisfaction survey over the next few weeks. We would appreciate your feedback and comments so that we may continue to improve ourselves and the health of our patients.

## 2021-12-05 DIAGNOSIS — M25572 Pain in left ankle and joints of left foot: Secondary | ICD-10-CM | POA: Diagnosis not present

## 2021-12-05 DIAGNOSIS — M25571 Pain in right ankle and joints of right foot: Secondary | ICD-10-CM | POA: Diagnosis not present

## 2021-12-05 DIAGNOSIS — Z96641 Presence of right artificial hip joint: Secondary | ICD-10-CM | POA: Diagnosis not present

## 2021-12-05 DIAGNOSIS — M6281 Muscle weakness (generalized): Secondary | ICD-10-CM | POA: Diagnosis not present

## 2021-12-08 DIAGNOSIS — Z96641 Presence of right artificial hip joint: Secondary | ICD-10-CM | POA: Diagnosis not present

## 2021-12-08 DIAGNOSIS — M6281 Muscle weakness (generalized): Secondary | ICD-10-CM | POA: Diagnosis not present

## 2021-12-08 DIAGNOSIS — M25571 Pain in right ankle and joints of right foot: Secondary | ICD-10-CM | POA: Diagnosis not present

## 2021-12-08 DIAGNOSIS — M25572 Pain in left ankle and joints of left foot: Secondary | ICD-10-CM | POA: Diagnosis not present

## 2021-12-13 DIAGNOSIS — M25571 Pain in right ankle and joints of right foot: Secondary | ICD-10-CM | POA: Diagnosis not present

## 2021-12-13 DIAGNOSIS — M6281 Muscle weakness (generalized): Secondary | ICD-10-CM | POA: Diagnosis not present

## 2021-12-13 DIAGNOSIS — Z8719 Personal history of other diseases of the digestive system: Secondary | ICD-10-CM | POA: Diagnosis not present

## 2021-12-13 DIAGNOSIS — Z96641 Presence of right artificial hip joint: Secondary | ICD-10-CM | POA: Diagnosis not present

## 2021-12-13 DIAGNOSIS — M25572 Pain in left ankle and joints of left foot: Secondary | ICD-10-CM | POA: Diagnosis not present

## 2021-12-13 DIAGNOSIS — K6389 Other specified diseases of intestine: Secondary | ICD-10-CM | POA: Diagnosis not present

## 2021-12-14 ENCOUNTER — Other Ambulatory Visit: Payer: Self-pay | Admitting: Internal Medicine

## 2021-12-14 DIAGNOSIS — Z1231 Encounter for screening mammogram for malignant neoplasm of breast: Secondary | ICD-10-CM

## 2021-12-22 DIAGNOSIS — M25572 Pain in left ankle and joints of left foot: Secondary | ICD-10-CM | POA: Diagnosis not present

## 2021-12-22 DIAGNOSIS — Z96641 Presence of right artificial hip joint: Secondary | ICD-10-CM | POA: Diagnosis not present

## 2021-12-22 DIAGNOSIS — M6281 Muscle weakness (generalized): Secondary | ICD-10-CM | POA: Diagnosis not present

## 2021-12-22 DIAGNOSIS — M25571 Pain in right ankle and joints of right foot: Secondary | ICD-10-CM | POA: Diagnosis not present

## 2021-12-23 DIAGNOSIS — M810 Age-related osteoporosis without current pathological fracture: Secondary | ICD-10-CM | POA: Diagnosis not present

## 2021-12-26 DIAGNOSIS — M6281 Muscle weakness (generalized): Secondary | ICD-10-CM | POA: Diagnosis not present

## 2021-12-26 DIAGNOSIS — Z96641 Presence of right artificial hip joint: Secondary | ICD-10-CM | POA: Diagnosis not present

## 2021-12-26 DIAGNOSIS — M25572 Pain in left ankle and joints of left foot: Secondary | ICD-10-CM | POA: Diagnosis not present

## 2021-12-26 DIAGNOSIS — M25571 Pain in right ankle and joints of right foot: Secondary | ICD-10-CM | POA: Diagnosis not present

## 2021-12-30 DIAGNOSIS — Z96641 Presence of right artificial hip joint: Secondary | ICD-10-CM | POA: Diagnosis not present

## 2021-12-30 DIAGNOSIS — M6281 Muscle weakness (generalized): Secondary | ICD-10-CM | POA: Diagnosis not present

## 2021-12-30 DIAGNOSIS — M25572 Pain in left ankle and joints of left foot: Secondary | ICD-10-CM | POA: Diagnosis not present

## 2021-12-30 DIAGNOSIS — M25571 Pain in right ankle and joints of right foot: Secondary | ICD-10-CM | POA: Diagnosis not present

## 2022-01-03 DIAGNOSIS — M6281 Muscle weakness (generalized): Secondary | ICD-10-CM | POA: Diagnosis not present

## 2022-01-03 DIAGNOSIS — M25571 Pain in right ankle and joints of right foot: Secondary | ICD-10-CM | POA: Diagnosis not present

## 2022-01-03 DIAGNOSIS — M25572 Pain in left ankle and joints of left foot: Secondary | ICD-10-CM | POA: Diagnosis not present

## 2022-01-03 DIAGNOSIS — Z96641 Presence of right artificial hip joint: Secondary | ICD-10-CM | POA: Diagnosis not present

## 2022-01-09 ENCOUNTER — Ambulatory Visit
Admission: RE | Admit: 2022-01-09 | Discharge: 2022-01-09 | Disposition: A | Discharge status: Home / Self Care | Payer: Medicare Other | Source: Ambulatory Visit | Attending: Internal Medicine | Admitting: Internal Medicine

## 2022-01-09 DIAGNOSIS — Z1231 Encounter for screening mammogram for malignant neoplasm of breast: Secondary | ICD-10-CM

## 2022-01-10 ENCOUNTER — Ambulatory Visit: Payer: Medicare Other

## 2022-01-10 DIAGNOSIS — M6281 Muscle weakness (generalized): Secondary | ICD-10-CM | POA: Diagnosis not present

## 2022-01-10 DIAGNOSIS — M25572 Pain in left ankle and joints of left foot: Secondary | ICD-10-CM | POA: Diagnosis not present

## 2022-01-10 DIAGNOSIS — M25571 Pain in right ankle and joints of right foot: Secondary | ICD-10-CM | POA: Diagnosis not present

## 2022-01-10 DIAGNOSIS — Z96641 Presence of right artificial hip joint: Secondary | ICD-10-CM | POA: Diagnosis not present

## 2022-01-17 DIAGNOSIS — M25571 Pain in right ankle and joints of right foot: Secondary | ICD-10-CM | POA: Diagnosis not present

## 2022-01-17 DIAGNOSIS — Z96641 Presence of right artificial hip joint: Secondary | ICD-10-CM | POA: Diagnosis not present

## 2022-01-17 DIAGNOSIS — M25572 Pain in left ankle and joints of left foot: Secondary | ICD-10-CM | POA: Diagnosis not present

## 2022-01-17 DIAGNOSIS — M6281 Muscle weakness (generalized): Secondary | ICD-10-CM | POA: Diagnosis not present

## 2022-01-18 DIAGNOSIS — F410 Panic disorder [episodic paroxysmal anxiety] without agoraphobia: Secondary | ICD-10-CM | POA: Diagnosis not present

## 2022-01-18 DIAGNOSIS — F331 Major depressive disorder, recurrent, moderate: Secondary | ICD-10-CM | POA: Diagnosis not present

## 2022-01-22 ENCOUNTER — Other Ambulatory Visit: Payer: Self-pay | Admitting: Adult Health

## 2022-01-24 DIAGNOSIS — R1011 Right upper quadrant pain: Secondary | ICD-10-CM | POA: Diagnosis not present

## 2022-01-24 DIAGNOSIS — Z8719 Personal history of other diseases of the digestive system: Secondary | ICD-10-CM | POA: Diagnosis not present

## 2022-01-24 DIAGNOSIS — K638219 Small intestinal bacterial overgrowth, unspecified: Secondary | ICD-10-CM | POA: Diagnosis not present

## 2022-01-24 DIAGNOSIS — K900 Celiac disease: Secondary | ICD-10-CM | POA: Diagnosis not present

## 2022-01-24 DIAGNOSIS — K3184 Gastroparesis: Secondary | ICD-10-CM | POA: Diagnosis not present

## 2022-01-26 DIAGNOSIS — R1011 Right upper quadrant pain: Secondary | ICD-10-CM | POA: Diagnosis not present

## 2022-01-27 DIAGNOSIS — M6281 Muscle weakness (generalized): Secondary | ICD-10-CM | POA: Diagnosis not present

## 2022-01-27 DIAGNOSIS — Z96641 Presence of right artificial hip joint: Secondary | ICD-10-CM | POA: Diagnosis not present

## 2022-01-27 DIAGNOSIS — M25571 Pain in right ankle and joints of right foot: Secondary | ICD-10-CM | POA: Diagnosis not present

## 2022-01-27 DIAGNOSIS — M25572 Pain in left ankle and joints of left foot: Secondary | ICD-10-CM | POA: Diagnosis not present

## 2022-02-02 DIAGNOSIS — R1011 Right upper quadrant pain: Secondary | ICD-10-CM | POA: Diagnosis not present

## 2022-02-06 ENCOUNTER — Telehealth: Payer: Self-pay | Admitting: Adult Health

## 2022-02-06 ENCOUNTER — Encounter: Payer: Self-pay | Admitting: Adult Health

## 2022-02-06 DIAGNOSIS — G43909 Migraine, unspecified, not intractable, without status migrainosus: Secondary | ICD-10-CM | POA: Diagnosis not present

## 2022-02-06 DIAGNOSIS — H53143 Visual discomfort, bilateral: Secondary | ICD-10-CM | POA: Diagnosis not present

## 2022-02-06 NOTE — Telephone Encounter (Signed)
I called pt and she said that she has had migraines for the last 2 wks living on maxalt.  (They come and go).  She said the maxalt is not really helping.  She said that symptoms she is having is more visual , bilateral vision affected (feeling like she is on boat, unsteady).  No headache, these are not on awakening.  Not her usual migraines.  I relayed that I did not have availability for her her to come in this week.  She was seen last 12-01-2021.  Since new symptoms I said to call her pcp or can see urgent care. She has hx of menieres.  She has no hearing loss. Barometric pressure is trigger for her.  I relayed will let Megan NP know. Next available was 02-17-2022 virtual 63mn slots.

## 2022-02-06 NOTE — Telephone Encounter (Signed)
Agreed. Since these are not her typical migraines, new symptoms she should be evaluated by urgent care. She would need to be seen in our office in person by me or MD

## 2022-02-06 NOTE — Telephone Encounter (Signed)
Pt is reporting that her migraines are worsening, she has taken her   rizatriptan (MAXALT-MLT) 10 MG disintegrating tablet, no relief.  Pt has had these migraines for weeks now, pt asking to be seen earlier.  Please call.

## 2022-02-06 NOTE — Telephone Encounter (Signed)
See other note

## 2022-02-07 NOTE — Telephone Encounter (Signed)
Spoke to patient made sooner appointment for 02/13/2022 Pt thanked me for calling

## 2022-02-07 NOTE — Telephone Encounter (Signed)
Ok to offer 3:30 this week or next

## 2022-02-08 DIAGNOSIS — H0102A Squamous blepharitis right eye, upper and lower eyelids: Secondary | ICD-10-CM | POA: Diagnosis not present

## 2022-02-08 DIAGNOSIS — H2513 Age-related nuclear cataract, bilateral: Secondary | ICD-10-CM | POA: Diagnosis not present

## 2022-02-08 DIAGNOSIS — H0102B Squamous blepharitis left eye, upper and lower eyelids: Secondary | ICD-10-CM | POA: Diagnosis not present

## 2022-02-08 DIAGNOSIS — H43811 Vitreous degeneration, right eye: Secondary | ICD-10-CM | POA: Diagnosis not present

## 2022-02-09 ENCOUNTER — Ambulatory Visit: Payer: Medicare Other | Admitting: Adult Health

## 2022-02-13 ENCOUNTER — Ambulatory Visit: Payer: Medicare Other | Admitting: Adult Health

## 2022-02-18 DIAGNOSIS — J069 Acute upper respiratory infection, unspecified: Secondary | ICD-10-CM | POA: Diagnosis not present

## 2022-02-18 DIAGNOSIS — E785 Hyperlipidemia, unspecified: Secondary | ICD-10-CM | POA: Diagnosis not present

## 2022-02-18 DIAGNOSIS — K900 Celiac disease: Secondary | ICD-10-CM | POA: Diagnosis not present

## 2022-02-18 DIAGNOSIS — G43909 Migraine, unspecified, not intractable, without status migrainosus: Secondary | ICD-10-CM | POA: Diagnosis not present

## 2022-02-18 DIAGNOSIS — Z20822 Contact with and (suspected) exposure to covid-19: Secondary | ICD-10-CM | POA: Diagnosis not present

## 2022-02-18 DIAGNOSIS — J45909 Unspecified asthma, uncomplicated: Secondary | ICD-10-CM | POA: Diagnosis not present

## 2022-02-20 ENCOUNTER — Encounter (HOSPITAL_BASED_OUTPATIENT_CLINIC_OR_DEPARTMENT_OTHER): Payer: Self-pay | Admitting: Emergency Medicine

## 2022-02-20 ENCOUNTER — Emergency Department (HOSPITAL_BASED_OUTPATIENT_CLINIC_OR_DEPARTMENT_OTHER): Payer: Medicare Other

## 2022-02-20 ENCOUNTER — Emergency Department (HOSPITAL_BASED_OUTPATIENT_CLINIC_OR_DEPARTMENT_OTHER)
Admission: EM | Admit: 2022-02-20 | Discharge: 2022-02-20 | Disposition: A | Discharge status: Home / Self Care | Payer: Medicare Other | Attending: Emergency Medicine | Admitting: Emergency Medicine

## 2022-02-20 ENCOUNTER — Other Ambulatory Visit: Payer: Self-pay

## 2022-02-20 DIAGNOSIS — B9789 Other viral agents as the cause of diseases classified elsewhere: Secondary | ICD-10-CM | POA: Insufficient documentation

## 2022-02-20 DIAGNOSIS — R042 Hemoptysis: Secondary | ICD-10-CM | POA: Diagnosis not present

## 2022-02-20 DIAGNOSIS — R058 Other specified cough: Secondary | ICD-10-CM | POA: Insufficient documentation

## 2022-02-20 DIAGNOSIS — J069 Acute upper respiratory infection, unspecified: Secondary | ICD-10-CM | POA: Insufficient documentation

## 2022-02-20 DIAGNOSIS — R9431 Abnormal electrocardiogram [ECG] [EKG]: Secondary | ICD-10-CM | POA: Diagnosis not present

## 2022-02-20 DIAGNOSIS — R059 Cough, unspecified: Secondary | ICD-10-CM | POA: Diagnosis not present

## 2022-02-20 DIAGNOSIS — K449 Diaphragmatic hernia without obstruction or gangrene: Secondary | ICD-10-CM | POA: Diagnosis not present

## 2022-02-20 LAB — RESPIRATORY PANEL BY PCR

## 2022-02-20 LAB — CBC WITH DIFFERENTIAL/PLATELET
Abs Immature Granulocytes: 0.04 10*3/uL (ref 0.00–0.07)
Basophils Absolute: 0.1 10*3/uL (ref 0.0–0.1)
Basophils Relative: 1 %
Eosinophils Absolute: 0.2 10*3/uL (ref 0.0–0.5)
Eosinophils Relative: 3 %
HCT: 39.1 % (ref 36.0–46.0)
Hemoglobin: 12.9 g/dL (ref 12.0–15.0)
Immature Granulocytes: 1 %
Lymphocytes Relative: 35 %
Lymphs Abs: 3.1 10*3/uL (ref 0.7–4.0)
MCH: 30.1 pg (ref 26.0–34.0)
MCHC: 33 g/dL (ref 30.0–36.0)
MCV: 91.4 fL (ref 80.0–100.0)
Monocytes Absolute: 0.8 10*3/uL (ref 0.1–1.0)
Monocytes Relative: 9 %
Neutro Abs: 4.7 10*3/uL (ref 1.7–7.7)
Neutrophils Relative %: 51 %
Platelets: 280 10*3/uL (ref 150–400)
RBC: 4.28 MIL/uL (ref 3.87–5.11)
RDW: 13.8 % (ref 11.5–15.5)
WBC: 8.9 10*3/uL (ref 4.0–10.5)
nRBC: 0 % (ref 0.0–0.2)

## 2022-02-20 LAB — BASIC METABOLIC PANEL
Anion gap: 7 (ref 5–15)
BUN: 17 mg/dL (ref 8–23)
CO2: 26 mmol/L (ref 22–32)
Calcium: 8.8 mg/dL — ABNORMAL LOW (ref 8.9–10.3)
Chloride: 109 mmol/L (ref 98–111)
Creatinine, Ser: 0.97 mg/dL (ref 0.44–1.00)
GFR, Estimated: >60 mL/min (ref >60)
Glucose, Bld: 115 mg/dL — ABNORMAL HIGH (ref 70–99)
Potassium: 3.7 mmol/L (ref 3.5–5.1)
Sodium: 142 mmol/L (ref 135–145)

## 2022-02-20 MED ORDER — IPRATROPIUM-ALBUTEROL 0.5-2.5 (3) MG/3ML IN SOLN
3.0000 mL | Freq: Once | RESPIRATORY_TRACT | Status: AC
  Administered 2022-02-20: 3 mL via RESPIRATORY_TRACT
  Filled 2022-02-20: qty 3

## 2022-02-20 MED ORDER — PREDNISONE 50 MG PO TABS: 50.0000 mg | ORAL_TABLET | Freq: Every day | ORAL | 0 refills | Status: DC

## 2022-02-20 MED ORDER — IOHEXOL 350 MG/ML SOLN
75.0000 mL | Freq: Once | INTRAVENOUS | Status: AC | PRN
  Administered 2022-02-20: 75 mL via INTRAVENOUS

## 2022-02-20 MED ORDER — AEROCHAMBER Z-STAT PLUS/MEDIUM MISC
1.0000 | Freq: Once | Status: AC
  Administered 2022-02-20: 1
  Filled 2022-02-20: qty 1

## 2022-02-20 MED ORDER — ALBUTEROL SULFATE HFA 108 (90 BASE) MCG/ACT IN AERS
1.0000 | INHALATION_SPRAY | RESPIRATORY_TRACT | Status: DC | PRN
  Administered 2022-02-20: 2 via RESPIRATORY_TRACT
  Filled 2022-02-20: qty 6.7

## 2022-02-20 MED ORDER — ALBUTEROL SULFATE (2.5 MG/3ML) 0.083% IN NEBU: 2.5000 mg | INHALATION_SOLUTION | Freq: Four times a day (QID) | RESPIRATORY_TRACT | 0 refills | Status: AC | PRN

## 2022-02-20 MED ORDER — PREDNISONE 20 MG PO TABS: 20.0000 mg | ORAL_TABLET | Freq: Every day | ORAL | 0 refills | Status: DC

## 2022-02-20 MED ORDER — SODIUM CHLORIDE 0.9 % IV BOLUS
1000.0000 mL | Freq: Once | INTRAVENOUS | Status: AC
  Administered 2022-02-20: 1000 mL via INTRAVENOUS

## 2022-02-20 MED ORDER — METHYLPREDNISOLONE SODIUM SUCC 125 MG IJ SOLR
125.0000 mg | Freq: Once | INTRAMUSCULAR | Status: AC
  Administered 2022-02-20: 125 mg via INTRAVENOUS
  Filled 2022-02-20: qty 2

## 2022-02-20 NOTE — ED Notes (Signed)
Patient transported to CT 

## 2022-02-20 NOTE — Progress Notes (Signed)
MDI with spacer provided at this time. Stated she takes them at home and did not need additional education

## 2022-02-20 NOTE — ED Provider Notes (Signed)
[facility] Provider Note   CSN: [id number] Arrival date & time: 02/20/22  1342     History  Chief Complaint  Patient presents with   Cough   Fatigue    Katelyn Mcclain is a 71 y.o. female.  Pt is a 71 yo female with a pmhx significant for celiac disease, migraines, gerd, ibs, osteopenia, and depression.  Pt said she's had cough/cold sx since 11/4.  She went to UC on 11/11 who gave her a shot of steroid and d/c her with amox, tessalon perles, and a cough medicine.  She was negative for covid/flu/rsv.  Pt said she woke up this am and was coughing up blood.  She has not coughed up any more blood since this am.  She did call her pcp who told her to come to the ED.  Pt has not had fevers.  She continues to have sinus drainage and cough.       Home Medications Prior to Admission medications   Medication Sig Start Date End Date Taking? Authorizing Provider  predniSONE (DELTASONE) 50 MG tablet Take 1 tablet (50 mg total) by mouth daily with breakfast. 02/20/22  Yes Isla Pence, MD  albuterol (PROVENTIL HFA;VENTOLIN HFA) 108 (90 BASE) MCG/ACT inhaler Inhale 1 puff into the lungs every 6 (six) hours as needed for wheezing or shortness of breath (seasonal allergies).     [provider]  albuterol (PROVENTIL) (2.5 MG/3ML) 0.083% nebulizer solution Take 3 mLs (2.5 mg total) by nebulization every 6 (six) hours as needed for wheezing or shortness of breath. 02/20/22   Isla Pence, MD  b complex vitamins capsule Take by mouth.    [provider]  benzonatate (TESSALON) 100 MG capsule Take 1 capsule (100 mg total) by mouth 3 (three) times daily as needed for cough. 05/01/19   Fawze, Mina A, PA-C  Calcium-Magnesium-Vitamin D (CALCIUM 1200+D3 PO) Take 1,200 mg by mouth daily.    [provider]  Clobetasol Propionate (IMPOYZ) 0.025 % CREA Apply 1 application topically daily. 07/25/19   Clark-Burning, Anderson Malta, PA-C  conjugated  estrogens (PREMARIN) vaginal cream Premarin 0.625 mg/gram vaginal cream  INSERT 0.5 APPLICATOR(S)FUL EVERY DAY BY VAGINAL ROUTE.    [provider]  Denosumab (PROLIA [place]) Inject into the skin. Injection every 6 months    [provider]  docusate sodium (COLACE) 100 MG capsule Take 100 mg by mouth 2 (two) times daily.    [provider]  Eszopiclone 3 MG TABS Take 1.5 mg by mouth at bedtime as needed (sleep). Take immediately before bedtime    [provider]  FLUAD 0.5 ML SUSY TO BE ADMINISTERED BY PHARMACIST FOR IMMUNIZATION 06/21/18   [provider]  gabapentin (NEURONTIN) 100 MG capsule Take 1-3 capsules at night 08/09/20   [provider]  imiquimod (ALDARA) 5 % cream Apply topically at bedtime. 07/10/20   Lavonna Monarch, MD  Meth-Hyo-M Bl-Na Phos-Ph Sal (URO-MP) 118 MG CAPS TAKE 1 CAPSULE BY MOUTH TWICE A DAY AS NEEDED 05/04/19   McKenzie, Candee Furbish, MD  montelukast (SINGULAIR) 10 MG tablet Take 10 mg by mouth at bedtime.  11/03/13   [provider]  MYRBETRIQ 50 MG TB24 tablet TAKE 1 TABLET DAILY 07/02/19   McKenzie, Candee Furbish, MD  omeprazole (PRILOSEC) 40 MG capsule Take 40 mg by mouth daily. 10/10/16   [provider]  ondansetron (ZOFRAN) 4 MG tablet TAKE 1 TABLET BY MOUTH EVERY 6 HOURS AS NEEDED FOR NAUSEA -  IF PHERERGAN NOT WORKING 07/30/17   [provider]  OVER THE COUNTER MEDICATION Take 500 mg by mouth daily. Vitamin B5    [provider]  pravastatin (PRAVACHOL) 10 MG tablet Take 10 mg by mouth 3 (three) times a week.    [provider]  Probiotic Product (PROBIOTIC DAILY PO) Take 1 tablet by mouth 2 (two) times daily.     [provider]  promethazine (PHENERGAN) 25 MG tablet Take 1 tablet (25 mg total) by mouth every 6 (six) hours as needed for nausea or vomiting. 11/30/15   Lawyer, Harrell Gave, PA-C  promethazine-dextromethorphan (PROMETHAZINE-DM) 6.25-15 MG/5ML syrup Take 5 mLs by  mouth 4 (four) times daily as needed for cough. 06/12/21   Loeffler, Adora Fridge, PA-C  pyridoxine (B-6) 200 MG tablet Take 200 mg by mouth daily.    [provider]  rizatriptan (MAXALT-MLT) 10 MG disintegrating tablet TAKE 1 TABLET AT ONSET OF MIGRAINE AND REPEAT IN 2 HOURS IF NEEDED 11/07/21   Ward Givens, NP  tacrolimus (PROTOPIC) 0.1 % ointment Apply topically 2 (two) times daily. 06/29/21   Lavonna Monarch, MD  Vibegron (GEMTESA) 75 MG TABS Take by mouth.    [provider]  vortioxetine HBr (TRINTELLIX) 20 MG TABS tablet Take 20 mg by mouth at bedtime.    [provider]  zonisamide (ZONEGRAN) 100 MG capsule TAKE 1 CAPSULE TWICE A DAY 01/23/22   Ward Givens, NP      Allergies    Oat grain extract allergy skin test, Wheat bran, Adhesive [tape], Morphine, Morphine and related, Barley grass, Clonazepam, Desvenlafaxine, Ezetimibe, Hydrocodone bit-homatrop mbr, Linaclotide, Metronidazole, Morphine sulfate, Oatmeal, Other, Theophylline, and Alprazolam    Review of Systems   Review of Systems  Respiratory:  Positive for cough.        Hemoptysis  All other systems reviewed and are negative.   Physical Exam Updated Vital Signs BP 126/71   Pulse 63   Temp 98.1 F (36.7 C) (Oral)   Resp 10   Ht 5' 10.5" (1.791 m)   Wt 79.4 kg   SpO2 98%   BMI 24.75 kg/m  Physical Exam Vitals and nursing note reviewed.  Constitutional:      Appearance: Normal appearance.  HENT:     Head: Normocephalic and atraumatic.     Right Ear: External ear normal.     Left Ear: External ear normal.     Nose: Nose normal.     Mouth/Throat:     Mouth: Mucous membranes are moist.     Pharynx: Oropharynx is clear.  Eyes:     Extraocular Movements: Extraocular movements intact.     Pupils: Pupils are equal, round, and reactive to light.  Cardiovascular:     Rate and Rhythm: Normal rate and regular rhythm.     Pulses: Normal pulses.     Heart sounds: Normal heart sounds.   Pulmonary:     Effort: Pulmonary effort is normal.     Breath sounds: Normal breath sounds.  Abdominal:     General: Abdomen is flat. Bowel sounds are normal.     Palpations: Abdomen is soft.  Musculoskeletal:        General: Normal range of motion.     Cervical back: Normal range of motion and neck supple.  Skin:    General: Skin is warm.     Capillary Refill: Capillary refill takes less than 2 seconds.  Neurological:     General: No focal deficit present.  Mental Status: She is alert and oriented to person, place, and time.  Psychiatric:        Mood and Affect: Mood normal.        Behavior: Behavior normal.     ED Results / Procedures / Treatments   Labs (all labs ordered are listed, but only abnormal results are displayed) Labs Reviewed  BASIC METABOLIC PANEL - Abnormal; Notable for the following components:      Result Value   Glucose, Bld 115 (*)    Calcium 8.8 (*)    All other components within normal limits  RESPIRATORY PANEL BY PCR  CBC WITH DIFFERENTIAL/PLATELET    EKG EKG Interpretation  Date/Time:  Monday February 20 2022 16:18:04 EST Ventricular Rate:  62 PR Interval:  171 QRS Duration: 97 QT Interval:  427 QTC Calculation: 434 R Axis:   -4 Text Interpretation: Sinus rhythm Low voltage, precordial leads No significant change since last tracing Confirmed by Isla Pence [phone]) on 02/20/2022 4:42:00 PM  Radiology CT Angio Chest PE W and/or Wo Contrast  Result Date: 02/20/2022 CLINICAL DATA:  Productive cough for 2 weeks EXAM: CT ANGIOGRAPHY CHEST WITH CONTRAST TECHNIQUE: Multidetector CT imaging of the chest was performed using the standard protocol during bolus administration of intravenous contrast. Multiplanar CT image reconstructions and MIPs were obtained to evaluate the vascular anatomy. RADIATION DOSE REDUCTION: This exam was performed according to the departmental dose-optimization program which includes automated exposure control,  adjustment of the mA and/or kV according to patient size and/or use of iterative reconstruction technique. CONTRAST:  76m OMNIPAQUE IOHEXOL 350 MG/ML SOLN COMPARISON:  02/20/2022, 10/25/2009 FINDINGS: Cardiovascular: This is a technically adequate evaluation of the pulmonary vasculature. There are no filling defects or pulmonary emboli. The heart is unremarkable without pericardial effusion. No evidence of thoracic aortic aneurysm or dissection. Mediastinum/Nodes: No enlarged mediastinal, hilar, or axillary lymph nodes. Thyroid gland, trachea, and esophagus demonstrate no significant findings. Small hiatal hernia. Lungs/Pleura: No acute airspace disease, effusion, or pneumothorax. Central airways are patent. Stable focal pleural thickening along the minor fissure anteriorly reference image 46/4, unchanged since 2011 and benign. Upper Abdomen: No acute abnormality. Musculoskeletal: No acute or destructive bony lesions. Reconstructed images demonstrate no additional findings. Review of the MIP images confirms the above findings. IMPRESSION: 1. No evidence of pulmonary embolus. 2. No acute intrathoracic process. 3. Small hiatal hernia. Electronically Signed   By: MRanda NgoM.D.   On: 02/20/2022 17:37   DG Chest 2 View  Result Date: 02/20/2022 CLINICAL DATA:  Productive cough for 2 weeks EXAM: CHEST - 2 VIEW COMPARISON:  None Available. FINDINGS: Normal mediastinum and cardiac silhouette. Normal pulmonary vasculature. No evidence of effusion, infiltrate, or pneumothorax. No acute bony abnormality. IMPRESSION: No acute cardiopulmonary process. Electronically Signed   By: SSuzy BouchardM.D.   On: 02/20/2022 14:23    Procedures Procedures    Medications Ordered in ED Medications  methylPREDNISolone sodium succinate (SOLU-MEDROL) 125 mg/2 mL injection 125 mg (has no administration in time range)  albuterol (VENTOLIN HFA) 108 (90 Base) MCG/ACT inhaler 1-2 puff (2 puffs Inhalation Given 02/20/22 1802)   sodium chloride 0.9 % bolus 1,000 mL (1,000 mLs Intravenous New Bag/Given 02/20/22 1625)  iohexol (OMNIPAQUE) 350 MG/ML injection 75 mL (75 mLs Intravenous Contrast Given 02/20/22 1713)  ipratropium-albuterol (DUONEB) 0.5-2.5 (3) MG/3ML nebulizer solution 3 mL (3 mLs Nebulization Given 02/20/22 1802)  aerochamber Z-Stat Plus/medium 1 each (1 each Other Given 02/20/22 1802)    ED Course/ Medical Decision Making/  A&P                           Medical Decision Making Amount and/or Complexity of Data Reviewed Labs: ordered. Radiology: ordered.  Risk Prescription drug management.   This patient presents to the ED for concern of hemoptysis, this involves an extensive number of treatment options, and is a complaint that carries with it a high risk of complications and morbidity.  The differential diagnosis includes uri, bronchitis, pna, PE, tumor   Co morbidities that complicate the patient evaluation   celiac disease, migraines, gerd, ibs, osteopenia, and depression   Additional history obtained:  Additional history obtained from epic chart review External records from outside source obtained and reviewed including husband   Lab Tests:  I Ordered, and personally interpreted labs.  The pertinent results include:  cbc nl, bmp nl   Imaging Studies ordered:  I ordered imaging studies including cxr and CT chest  I independently visualized and interpreted imaging which showed  CXR:  IMPRESSION: No acute cardiopulmonary process CT chest: 1. No evidence of pulmonary embolus.  2. No acute intrathoracic process.  3. Small hiatal hernia.   I agree with the radiologist interpretation   Cardiac Monitoring:  The patient was maintained on a cardiac monitor.  I personally viewed and interpreted the cardiac monitored which showed an underlying rhythm of: nsr   Medicines ordered and prescription drug management:  I ordered medication including IVFs  for dehydration/solumedrol/neb  for sob/cough  Reevaluation of the patient after these medicines showed that the patient improved I have reviewed the patients home medicines and have made adjustments as needed   Test Considered:  ct   Critical Interventions:  Duoneb/solumedrol   Problem List / ED Course:  Hemoptysis:  likely due to bronchial irritation from URI.  RVP sent as she's still not improving after more than a week.  Pt is stable.  CT neg.  She is stable for d/c.  Return if worse.  F/u with pcp.   Reevaluation:  After the interventions noted above, I reevaluated the patient and found that they have :improved   Social Determinants of Health:  Lives at home   Dispostion:  After consideration of the diagnostic results and the patients response to treatment, I feel that the patent would benefit from discharge with outpatient f/u.          Final Clinical Impression(s) / ED Diagnoses Final diagnoses:  Viral upper respiratory tract infection    Rx / DC Orders ED Discharge Orders          Ordered    predniSONE (DELTASONE) 50 MG tablet  Daily with breakfast        02/20/22 1804    albuterol (PROVENTIL) (2.5 MG/3ML) 0.083% nebulizer solution  Every 6 hours PRN        02/20/22 1804              Isla Pence, MD 02/20/22 1807

## 2022-02-20 NOTE — ED Triage Notes (Signed)
URI sx since 11/4. Seen at [facility] and prescribed meds. Negative flu/covid/rsv test. Sx unrelieved with meds. Pt reports coughing up blood once this morning.

## 2022-02-20 NOTE — Discharge Instructions (Addendum)
Continue current medications and add the prednisone.  Use the inhaler or the nebulizer machine every 4 hours for shortness of breath or cough.  Check "My Chart" for results of the respiratory virus panel.

## 2022-02-21 ENCOUNTER — Telehealth (HOSPITAL_BASED_OUTPATIENT_CLINIC_OR_DEPARTMENT_OTHER): Payer: Self-pay | Admitting: Emergency Medicine

## 2022-03-06 DIAGNOSIS — K449 Diaphragmatic hernia without obstruction or gangrene: Secondary | ICD-10-CM | POA: Diagnosis not present

## 2022-03-06 DIAGNOSIS — Z1211 Encounter for screening for malignant neoplasm of colon: Secondary | ICD-10-CM | POA: Diagnosis not present

## 2022-03-06 DIAGNOSIS — Z9889 Other specified postprocedural states: Secondary | ICD-10-CM | POA: Diagnosis not present

## 2022-03-06 DIAGNOSIS — D125 Benign neoplasm of sigmoid colon: Secondary | ICD-10-CM | POA: Diagnosis not present

## 2022-03-06 DIAGNOSIS — K2950 Unspecified chronic gastritis without bleeding: Secondary | ICD-10-CM | POA: Diagnosis not present

## 2022-03-06 DIAGNOSIS — K5730 Diverticulosis of large intestine without perforation or abscess without bleeding: Secondary | ICD-10-CM | POA: Diagnosis not present

## 2022-03-06 DIAGNOSIS — K900 Celiac disease: Secondary | ICD-10-CM | POA: Diagnosis not present

## 2022-03-06 DIAGNOSIS — D123 Benign neoplasm of transverse colon: Secondary | ICD-10-CM | POA: Diagnosis not present

## 2022-03-06 DIAGNOSIS — K3189 Other diseases of stomach and duodenum: Secondary | ICD-10-CM | POA: Diagnosis not present

## 2022-03-06 DIAGNOSIS — K635 Polyp of colon: Secondary | ICD-10-CM | POA: Diagnosis not present

## 2022-03-08 DIAGNOSIS — S39012A Strain of muscle, fascia and tendon of lower back, initial encounter: Secondary | ICD-10-CM | POA: Diagnosis not present

## 2022-03-08 DIAGNOSIS — M545 Low back pain, unspecified: Secondary | ICD-10-CM | POA: Diagnosis not present

## 2022-03-17 DIAGNOSIS — M545 Low back pain, unspecified: Secondary | ICD-10-CM | POA: Diagnosis not present

## 2022-03-17 DIAGNOSIS — M546 Pain in thoracic spine: Secondary | ICD-10-CM | POA: Diagnosis not present

## 2022-03-22 DIAGNOSIS — J45909 Unspecified asthma, uncomplicated: Secondary | ICD-10-CM | POA: Diagnosis not present

## 2022-03-22 DIAGNOSIS — J309 Allergic rhinitis, unspecified: Secondary | ICD-10-CM | POA: Diagnosis not present

## 2022-03-22 DIAGNOSIS — J45901 Unspecified asthma with (acute) exacerbation: Secondary | ICD-10-CM | POA: Diagnosis not present

## 2022-03-22 DIAGNOSIS — M940 Chondrocostal junction syndrome [Tietze]: Secondary | ICD-10-CM | POA: Diagnosis not present

## 2022-03-30 ENCOUNTER — Ambulatory Visit (INDEPENDENT_AMBULATORY_CARE_PROVIDER_SITE_OTHER): Payer: Medicare Other | Admitting: Adult Health

## 2022-03-30 ENCOUNTER — Encounter: Payer: Self-pay | Admitting: Adult Health

## 2022-03-30 VITALS — BP 119/73 | HR 65 | Ht 70.5 in | Wt 178.0 lb

## 2022-03-30 DIAGNOSIS — G43009 Migraine without aura, not intractable, without status migrainosus: Secondary | ICD-10-CM

## 2022-03-30 NOTE — Progress Notes (Signed)
PATIENT: Katelyn Mcclain DOB: [birth year]  REASON FOR VISIT: follow up HISTORY FROM: patient  Chief Complaint  Patient presents with   RM 19    Here alone for evaluation of visual difficulty and migraines. She states it started back in November and has kind of cleared up. She was having dizzy spells, vertigo, feeling like she was on a boat. She took Dramamine but then she got sick with rhinovirus. She was treated with Prednisone. Her asthma was not under control. She had to take her inhalers regularly and nebulizers. She has had 3 rounds of prednisone. Developed costochondritis. She is better now.      HISTORY OF PRESENT ILLNESS: Today 03/30/22: Katelyn Mcclain is a 71 year old female with a history of migraine headaches.  She returns today for follow-up.  She states that she Had visual issues that started in November. She thinks it was due to rhinovirus. Felt like she floating on a boat. Felt "sea sick" but also had a headache. Would use maxalt during this time but would not resolve the headache. Started dramamine and that helped. No longer taking now. No longer having symptoms  Continues Zonegran 100 mg BID. Continues to control migraines.     12/01/21: Katelyn Mcclain is a 70 year old female with a history of migraine headaches and paresthesias in the lower extremities.  She returns today for follow-up.  She remains on Zonegran 100 mg BID for prevention and Maxalt for abortive therapy.  She reports that she is has had an ongoing headache for the last 3 days. Report that headache is a 2/10 on pain scale. Reports that headaches have been manageable and well controlled. Had hip surgery and since then has had different symptoms should as heat intolerance, BP issues, cold all the time. Reports that she still has paresthesias and has plantar facitis and is in PT. Feels tingling in the toes left is worse than right.  Had NCV/EMG in 2019. Currently on Gabapentin.     NCV/ EMG IMPRESSION:   Nerve  conduction studies done on both lower extremities were notable for mild sensory latency abnormalities suggestive of an early peripheral neuropathy.  EMG evaluation of the right lower extremity was relatively unremarkable, no evidence of an overlying lumbosacral radiculopathy was seen.  12/01/20: Katelyn Mcclain is a 71 year old female with a history of migraine headaches and paresthesias in the lower extremities.  She returns today for follow-up.  Overall she feels that her headaches have remained relatively stable.  She continues to use Maxalt as an abortive therapy and Zonegran 100 mg daily for prevention.  Reports that she is not even having 1 headache a month now.  Numbness in the lower extremities have remained relatively stable.  Reports that she seen orthopedics for Planter fasciitis. no changes in her gait or balance.  12/01/19: Katelyn Mcclain is a 71 year old female with a history of migraine headaches and paresthesias in the lower extremities.  She returns today for follow-up.  She states that her headaches have remained relatively stable.  She has approximately 1 headache a month.  Triggers her barometric pressure.  She does state that she travel to the [place] and this made her headaches worse.  She also has a history of Mnire's disease.  She states that Maxalt is beneficial for her headaches.  She continues to have numbness in the lower extremities.  Tried and failed gabapentin.  No changes in her gait or balance.  HISTORY 06/02/19:   Katelyn Mcclain is a 71 year old female with  a history of migraine headaches and paresthesias in the lower extremities.  She returns today for follow-up.  She reports that her headaches have increased in the last month or so.  She feels that this is related to the weather.  She states that she is using more Maxalt.  Approximately 2 to 4 tablets a week.  She reduced her Zonegran dose to 100 mg twice a day.  Ultimately she would like to continue to reduce the dose.  She  continues to have numbness in the toes.  Nerve conduction studies did reveal a early neuropathy.  She denies any discomfort.  Denies any changes with her gait or balance.  She tried gabapentin but did not like this medication.  She returns today for evaluation  REVIEW OF SYSTEMS: Out of a complete 14 system review of symptoms, the patient complains only of the following symptoms, and all other reviewed systems are negative.  See HPI  ALLERGIES: Allergies  Allergen Reactions   Oat Grain Extract Allergy Skin Test Diarrhea and Nausea And Vomiting   Wheat Bran Other (See Comments) and Diarrhea    Celiac disease   Adhesive [Tape] Other (See Comments)    Tear's skin off    Morphine Other (See Comments)   Morphine And Related Other (See Comments)    Sensitive to narcotics, worsening migraines.   Barley Grass Other (See Comments)    Celiac disease   Also rye   Clonazepam Other (See Comments)    Caused anxiety   Desvenlafaxine Nausea Only   Ezetimibe     Other Reaction(s): arthralgias   Hydrocodone Bit-Homatrop Mbr Other (See Comments)   Linaclotide Other (See Comments)    Caused spastic bladder (reaction to Linzess)  Other Reaction(s): bladder spasms   Metronidazole Other (See Comments)    Black tongue and diarrhea   Morphine Sulfate Other (See Comments)   Oatmeal Other (See Comments)    Celiac disease   Other     Pt does not use artificial sweeteners  Now has anaphylactic allergic to allergy shots due to COVID     Theophylline Nausea Only   Alprazolam Other (See Comments) and Anxiety    HOME MEDICATIONS: Outpatient Medications Prior to Visit  Medication Sig Dispense Refill   albuterol (PROVENTIL HFA;VENTOLIN HFA) 108 (90 BASE) MCG/ACT inhaler Inhale 1 puff into the lungs every 6 (six) hours as needed for wheezing or shortness of breath (seasonal allergies). Currently doing 2 puffs per day     albuterol (PROVENTIL) (2.5 MG/3ML) 0.083% nebulizer solution Take 3 mLs (2.5 mg total)  by nebulization every 6 (six) hours as needed for wheezing or shortness of breath. (Patient taking differently: Take 2.5 mg by nebulization every 6 (six) hours as needed for wheezing or shortness of breath. Currently taking daily) 75 mL 0   b complex vitamins capsule Take by mouth.     benzonatate (TESSALON) 100 MG capsule Take 1 capsule (100 mg total) by mouth 3 (three) times daily as needed for cough. 21 capsule 0   Calcium-Magnesium-Vitamin D (CALCIUM 1200+D3 PO) Take 1,200 mg by mouth daily.     Clobetasol Propionate (IMPOYZ) 0.025 % CREA Apply 1 application topically daily. (Patient taking differently: Apply 1 application  topically as needed.) 60 g 0   Denosumab (PROLIA [place]) Inject into the skin. Injection every 6 months     docusate sodium (COLACE) 100 MG capsule Take 100 mg by mouth 2 (two) times daily.     Eszopiclone 3 MG TABS Take 1.5  mg by mouth at bedtime as needed (sleep). Take immediately before bedtime     gabapentin (NEURONTIN) 100 MG capsule Take 300 mg by mouth at bedtime.     imiquimod (ALDARA) 5 % cream Apply topically at bedtime. (Patient taking differently: Apply topically at bedtime. As needed) 12 each 0   Meth-Hyo-M Bl-Na Phos-Ph Sal (URO-MP) 118 MG CAPS TAKE 1 CAPSULE BY MOUTH TWICE A DAY AS NEEDED 30 capsule 5   montelukast (SINGULAIR) 10 MG tablet Take 10 mg by mouth at bedtime.      MYRBETRIQ 50 MG TB24 tablet TAKE 1 TABLET DAILY 90 tablet 3   omeprazole (PRILOSEC) 40 MG capsule Take 40 mg by mouth daily.     ondansetron (ZOFRAN) 4 MG tablet TAKE 1 TABLET BY MOUTH EVERY 6 HOURS AS NEEDED FOR NAUSEA - IF PHERERGAN NOT WORKING  2   OVER THE COUNTER MEDICATION Take 500 mg by mouth daily. Vitamin B5     pravastatin (PRAVACHOL) 10 MG tablet Take 10 mg by mouth daily.     Probiotic Product (PROBIOTIC DAILY PO) Take 1 tablet by mouth 2 (two) times daily.      promethazine (PHENERGAN) 25 MG tablet Take 1 tablet (25 mg total) by mouth every 6 (six) hours as needed for nausea or  vomiting. 10 tablet 0   pyridoxine (B-6) 200 MG tablet Take 200 mg by mouth daily.     rizatriptan (MAXALT-MLT) 10 MG disintegrating tablet TAKE 1 TABLET AT ONSET OF MIGRAINE AND REPEAT IN 2 HOURS IF NEEDED 27 tablet 3   tacrolimus (PROTOPIC) 0.1 % ointment Apply topically 2 (two) times daily. 100 g 2   Vibegron (GEMTESA) 75 MG TABS Take by mouth daily.     vortioxetine HBr (TRINTELLIX) 20 MG TABS tablet Take 20 mg by mouth at bedtime.     zonisamide (ZONEGRAN) 100 MG capsule TAKE 1 CAPSULE TWICE A DAY 180 capsule 3   FLUAD 0.5 ML SUSY TO BE ADMINISTERED BY PHARMACIST FOR IMMUNIZATION     predniSONE (DELTASONE) 20 MG tablet Take 1 tablet (20 mg total) by mouth daily with breakfast. (Patient not taking: Reported on 03/30/2022) 5 tablet 0   promethazine-dextromethorphan (PROMETHAZINE-DM) 6.25-15 MG/5ML syrup Take 5 mLs by mouth 4 (four) times daily as needed for cough. 118 mL 0   conjugated estrogens (PREMARIN) vaginal cream Premarin 0.625 mg/gram vaginal cream  INSERT 0.5 APPLICATOR(S)FUL EVERY DAY BY VAGINAL ROUTE.     Facility-Administered Medications Prior to Visit  Medication Dose Route Frequency Provider Last Rate Last Admin   ipratropium (ATROVENT) nebulizer solution 0.5 mg  0.5 mg Nebulization Once Dunn, Ryan M, PA-C        PAST MEDICAL HISTORY: Past Medical History:  Diagnosis Date   Abscessed tooth    Allergic rhinitis    Asthma    Asthma    inhaler prn   Celiac disease    Depression with anxiety    Dysmenorrhea    Gastroparesis    GERD (gastroesophageal reflux disease)    Hx of low back pain    IBS (irritable bowel syndrome)    Migraine    Migraine without aura, with intractable migraine, so stated, without mention of status migrainosus 05/20/2013   Osteopenia    Rhinovirus 2023    PAST SURGICAL HISTORY: Past Surgical History:  Procedure Laterality Date   ABDOMINAL SURGERY     BREAST BIOPSY Right    x2   BREAST CYST ASPIRATION  1999   COLONOSCOPY  FOOT  SURGERY Left    Bunionectomy   MASS EXCISION  08/08/2011   Procedure: MINOR EXCISION OF MASS;  Surgeon: Cammie Sickle., MD;  Location: [facility];  Service: Orthopedics;  Laterality: Left;  left ring excision cyst proximal phalangeal joint   NISSEN FUNDOPLICATION     ROTATOR CUFF REPAIR Right 05/2015   TONSILLECTOMY     TOTAL HIP ARTHROPLASTY Right    June 2023    FAMILY HISTORY: Family History  Problem Relation Age of Onset   Stroke Father    Dementia Father    Atrial fibrillation Father    Cancer Father    Hypertension Mother    Scoliosis Mother    Heart disease Sister        Congenital heart disease   Breast cancer Maternal Grandmother    Migraines Neg Hx     SOCIAL HISTORY: Social History   Socioeconomic History   Marital status: Married    Spouse name: Not on file   Number of children: 0   Years of education: college   Highest education level: Not on file  Occupational History    Employer: [employer]    Comment: [employer]  Tobacco Use   Smoking status: Former   Smokeless tobacco: Never   Tobacco comments:    IN college 1 year  Vaping Use   Vaping Use: Never used  Substance and Sexual Activity   Alcohol use: Not Currently    Comment: wine occasionally   Drug use: No   Sexual activity: Yes    Birth control/protection: Post-menopausal  Other Topics Concern   Not on file  Social History Narrative   Patient lives at home with her husband Hoy Morn).   Patient is retired.   Education college.   Right handed.    Caffeine:  decaf maybe 1 cup every other month   Social Determinants of Health   Financial Resource Strain: Not on file  Food Insecurity: Not on file  Transportation Needs: Not on file  Physical Activity: Not on file  Stress: Not on file  Social Connections: Not on file  Intimate Partner Violence: Not on file      PHYSICAL EXAM  Vitals:   03/30/22 0824  BP: 119/73  Pulse: 65  Weight: 178 lb (80.7 kg)   Height: 5' 10.5" (1.791 m)     Body mass index is 25.18 kg/m.  Generalized: Well developed, in no acute distress   Neurological examination  Mentation: Alert oriented to time, place, history taking. Follows all commands speech and language fluent Cranial nerve II-XII:  Extraocular movements were full, visual field were full on confrontational test.  Head turning and shoulder shrug  were normal and symmetric. Motor: The motor testing reveals 5 over 5 strength of all 4 extremities. Good symmetric motor tone is noted throughout.  Sensory: Sensory testing is intact to soft touch on all 4 extremities. No evidence of extinction is noted.  Coordination: Cerebellar testing reveals good finger-nose-finger and heel-to-shin bilaterally.  Gait and station: Gait is normal.  Reflexes: Deep tendon reflexes are symmetric and normal bilaterally.   DIAGNOSTIC DATA (LABS, IMAGING, TESTING) - I reviewed patient records, labs, notes, testing and imaging myself where available.  Lab Results  Component Value Date   WBC 8.9 02/20/2022   HGB 12.9 02/20/2022   HCT 39.1 02/20/2022   MCV 91.4 02/20/2022   PLT 280 02/20/2022      Component Value Date/Time   NA 142 02/20/2022  1623   K 3.7 02/20/2022 1623   CL 109 02/20/2022 1623   CO2 26 02/20/2022 1623   GLUCOSE 115 (H) 02/20/2022 1623   BUN 17 02/20/2022 1623   CREATININE 0.97 02/20/2022 1623   CALCIUM 8.8 (L) 02/20/2022 1623   PROT 6.5 05/01/2019 1049   ALBUMIN 3.3 (L) 05/01/2019 1049   AST 45 (H) 05/01/2019 1049   ALT 79 (H) 05/01/2019 1049   ALKPHOS 72 05/01/2019 1049   BILITOT 0.5 05/01/2019 1049   GFRNONAA >60 02/20/2022 1623   GFRAA >60 05/01/2019 1049    Lab Results  Component Value Date   VITAMINB12 597 12/25/2017   Lab Results  Component Value Date   TSH 1.138 10/01/2017      ASSESSMENT AND PLAN 71 y.o. year old female  has a past medical history of Abscessed tooth, Allergic rhinitis, Asthma, Asthma, Celiac disease,  Depression with anxiety, Dysmenorrhea, Gastroparesis, GERD (gastroesophageal reflux disease), low back pain, IBS (irritable bowel syndrome), Migraine, Migraine without aura, with intractable migraine, so stated, without mention of status migrainosus (05/20/2013), Osteopenia, and Rhinovirus (2023). here with:  1.  Migraine headaches  Continue Zonegran 100 mg twice a day for preventative therapy Continue Maxalt for abortive therapy Advised if symptoms worsen or she develops new symptoms she should let us know Follow-up in 6 months or sooner if needed    Ward Givens, MSN, NP-C 03/30/2022, 8:32 AM [facility] [address], [place], [place] [address] [phone]

## 2022-03-30 NOTE — Patient Instructions (Signed)
Your Plan:  Continue zonegran and maxalt  If your symptoms worsen or you develop new symptoms please let us know.    Thank you for coming to see [place] at [facility]. I hope we have been able to provide you high quality care today.  You may receive a patient satisfaction survey over the next few weeks. We would appreciate your feedback and comments so that we may continue to improve ourselves and the health of our patients.

## 2022-04-12 DIAGNOSIS — Z83719 Family history of colon polyps, unspecified: Secondary | ICD-10-CM | POA: Diagnosis not present

## 2022-04-12 DIAGNOSIS — K449 Diaphragmatic hernia without obstruction or gangrene: Secondary | ICD-10-CM | POA: Diagnosis not present

## 2022-04-12 DIAGNOSIS — Z79899 Other long term (current) drug therapy: Secondary | ICD-10-CM | POA: Diagnosis not present

## 2022-04-12 DIAGNOSIS — Z800 Family history of malignant neoplasm of digestive organs: Secondary | ICD-10-CM | POA: Diagnosis not present

## 2022-04-12 DIAGNOSIS — K589 Irritable bowel syndrome without diarrhea: Secondary | ICD-10-CM | POA: Diagnosis not present

## 2022-04-12 DIAGNOSIS — K638219 Small intestinal bacterial overgrowth, unspecified: Secondary | ICD-10-CM | POA: Diagnosis not present

## 2022-04-12 DIAGNOSIS — K900 Celiac disease: Secondary | ICD-10-CM | POA: Diagnosis not present

## 2022-04-12 DIAGNOSIS — Z8379 Family history of other diseases of the digestive system: Secondary | ICD-10-CM | POA: Diagnosis not present

## 2022-04-12 DIAGNOSIS — Z9889 Other specified postprocedural states: Secondary | ICD-10-CM | POA: Diagnosis not present

## 2022-04-12 DIAGNOSIS — K219 Gastro-esophageal reflux disease without esophagitis: Secondary | ICD-10-CM | POA: Diagnosis not present

## 2022-04-21 DIAGNOSIS — R490 Dysphonia: Secondary | ICD-10-CM | POA: Diagnosis not present

## 2022-04-21 DIAGNOSIS — J309 Allergic rhinitis, unspecified: Secondary | ICD-10-CM | POA: Diagnosis not present

## 2022-04-21 DIAGNOSIS — J4540 Moderate persistent asthma, uncomplicated: Secondary | ICD-10-CM | POA: Diagnosis not present

## 2022-04-21 DIAGNOSIS — G4700 Insomnia, unspecified: Secondary | ICD-10-CM | POA: Diagnosis not present

## 2022-04-26 DIAGNOSIS — K219 Gastro-esophageal reflux disease without esophagitis: Secondary | ICD-10-CM | POA: Diagnosis not present

## 2022-04-26 DIAGNOSIS — Z9889 Other specified postprocedural states: Secondary | ICD-10-CM | POA: Diagnosis not present

## 2022-05-21 ENCOUNTER — Emergency Department (HOSPITAL_BASED_OUTPATIENT_CLINIC_OR_DEPARTMENT_OTHER): Payer: Medicare Other

## 2022-05-21 ENCOUNTER — Encounter (HOSPITAL_BASED_OUTPATIENT_CLINIC_OR_DEPARTMENT_OTHER): Payer: Self-pay | Admitting: Emergency Medicine

## 2022-05-21 ENCOUNTER — Emergency Department (HOSPITAL_BASED_OUTPATIENT_CLINIC_OR_DEPARTMENT_OTHER)
Admission: EM | Admit: 2022-05-21 | Discharge: 2022-05-21 | Disposition: A | Discharge status: Home / Self Care | Payer: Medicare Other | Attending: Emergency Medicine | Admitting: Emergency Medicine

## 2022-05-21 ENCOUNTER — Other Ambulatory Visit: Payer: Self-pay

## 2022-05-21 DIAGNOSIS — R0789 Other chest pain: Secondary | ICD-10-CM | POA: Diagnosis not present

## 2022-05-21 DIAGNOSIS — R059 Cough, unspecified: Secondary | ICD-10-CM | POA: Diagnosis present

## 2022-05-21 DIAGNOSIS — R079 Chest pain, unspecified: Secondary | ICD-10-CM | POA: Diagnosis not present

## 2022-05-21 DIAGNOSIS — J40 Bronchitis, not specified as acute or chronic: Secondary | ICD-10-CM | POA: Insufficient documentation

## 2022-05-21 LAB — BASIC METABOLIC PANEL
Anion gap: 7 (ref 5–15)
BUN: 17 mg/dL (ref 8–23)
CO2: 22 mmol/L (ref 22–32)
Calcium: 9.1 mg/dL (ref 8.9–10.3)
Chloride: 107 mmol/L (ref 98–111)
Creatinine, Ser: 1.05 mg/dL — ABNORMAL HIGH (ref 0.44–1.00)
GFR, Estimated: 57 mL/min — ABNORMAL LOW (ref >60)
Glucose, Bld: 140 mg/dL — ABNORMAL HIGH (ref 70–99)
Potassium: 3.4 mmol/L — ABNORMAL LOW (ref 3.5–5.1)
Sodium: 136 mmol/L (ref 135–145)

## 2022-05-21 LAB — CBC
HCT: 42.5 % (ref 36.0–46.0)
Hemoglobin: 14.2 g/dL (ref 12.0–15.0)
MCH: 30.5 pg (ref 26.0–34.0)
MCHC: 33.4 g/dL (ref 30.0–36.0)
MCV: 91.2 fL (ref 80.0–100.0)
Platelets: 324 10*3/uL (ref 150–400)
RBC: 4.66 MIL/uL (ref 3.87–5.11)
RDW: 14 % (ref 11.5–15.5)
WBC: 6.9 10*3/uL (ref 4.0–10.5)
nRBC: 0 % (ref 0.0–0.2)

## 2022-05-21 LAB — TROPONIN I (HIGH SENSITIVITY)
Troponin I (High Sensitivity): <2 ng/L (ref <18)
Troponin I (High Sensitivity): 2 ng/L (ref <18)

## 2022-05-21 MED ORDER — METHYLPREDNISOLONE 4 MG PO TBPK: ORAL_TABLET | ORAL | 0 refills | Status: DC

## 2022-05-21 MED ORDER — DOXYCYCLINE HYCLATE 100 MG PO CAPS: 100.0000 mg | ORAL_CAPSULE | Freq: Two times a day (BID) | ORAL | 0 refills | Status: DC

## 2022-05-21 NOTE — ED Notes (Signed)
ED Provider at bedside. 

## 2022-05-21 NOTE — ED Notes (Signed)
   05/21/22 1300  Therapy Vitals  Pulse Rate 89  Resp 17  Patient Position (if appropriate) Sitting  MEWS Score/Color  MEWS Score 0  MEWS Score Color Green  Respiratory Assessment  Assessment Type Assess only  Respiratory Pattern Regular;Unlabored;Symmetrical  Chest Assessment Chest expansion symmetrical  R Upper  Breath Sounds Clear;Diminished  L Upper Breath Sounds Clear;Diminished  R Lower Breath Sounds Clear;Diminished  L Lower Breath Sounds Clear;Diminished  Oxygen Therapy/Pulse Ox  O2 Therapy Room air   Seen in lobby before triage, SpO2 99%, no resp distress noted, productive cough with clear secretions at times.

## 2022-05-21 NOTE — Discharge Instructions (Signed)
You have been seen and discharged from the emergency department.  Your chest x-ray showed no pneumonia.  I believe you are suffering from bronchitis.  Take steroid and antibiotic as directed.  Continue your nebulizer treatments.  Follow-up with your primary provider for further evaluation and further care. Take home medications as prescribed. If you have any worsening symptoms or further concerns for your health please return to an emergency department for further evaluation.

## 2022-05-21 NOTE — ED Triage Notes (Signed)
Pt states her asthma has been acting up.  She states she is now having some chest pain.  No known fever.  Pt state she had vomited about 2 weeks ago and she thinks she may have aspirated.  No acute respiratory distress.  Pt has productive cough of clear secretions.

## 2022-05-21 NOTE — ED Provider Notes (Signed)
[facility]ERGENC[facility]MENT AT [facility] [facility] [facility] Provider Note   CSN: [id number] Arrival date & time: 05/21/22  1249     History  Chief Complaint  Patient presents with   Cough   Chest Pain    Katelyn Mcclain is a 72 y.o. female.  HPI   72 year old female with past medical history of asthma presents to the emergency department with intermittently productive cough.  Patient states this started about 2 weeks ago.  Has been persistent, worsening.  Associated chills and parasternal chest pain with deep inspiration.  No documented fever.  No swelling of her lower extremities.  No recent sick contacts.  Home Medications Prior to Admission medications   Medication Sig Start Date End Date Taking? Authorizing Provider  doxycycline (VIBRAMYCIN) 100 MG capsule Take 1 capsule (100 mg total) by mouth 2 (two) times daily. 05/21/22  Yes Braun Rocca, Alvin Critchley, DO  methylPREDNISolone (MEDROL DOSEPAK) 4 MG TBPK tablet Take as directed 05/21/22  Yes Torianne Laflam M, DO  albuterol (PROVENTIL HFA;VENTOLIN HFA) 108 (90 BASE) MCG/ACT inhaler Inhale 1 puff into the lungs every 6 (six) hours as needed for wheezing or shortness of breath (seasonal allergies). Currently doing 2 puffs per day    [provider]  albuterol (PROVENTIL) (2.5 MG/3ML) 0.083% nebulizer solution Take 3 mLs (2.5 mg total) by nebulization every 6 (six) hours as needed for wheezing or shortness of breath. Patient taking differently: Take 2.5 mg by nebulization every 6 (six) hours as needed for wheezing or shortness of breath. Currently taking daily 02/20/22   Isla Pence, MD  b complex vitamins capsule Take by mouth.    [provider]  benzonatate (TESSALON) 100 MG capsule Take 1 capsule (100 mg total) by mouth 3 (three) times daily as needed for cough. 05/01/19   Fawze, Mina A, PA-C  Calcium-Magnesium-Vitamin D (CALCIUM 1200+D3 PO) Take 1,200 mg by mouth daily.    [provider]  Clobetasol  Propionate (IMPOYZ) 0.025 % CREA Apply 1 application topically daily. Patient taking differently: Apply 1 application  topically as needed. 07/25/19   Clark-Burning, Anderson Malta, PA-C  Denosumab (PROLIA [place]) Inject into the skin. Injection every 6 months    [provider]  docusate sodium (COLACE) 100 MG capsule Take 100 mg by mouth 2 (two) times daily.    [provider]  Eszopiclone 3 MG TABS Take 1.5 mg by mouth at bedtime as needed (sleep). Take immediately before bedtime    [provider]  FLUAD 0.5 ML SUSY TO BE ADMINISTERED BY PHARMACIST FOR IMMUNIZATION 06/21/18   [provider]  gabapentin (NEURONTIN) 100 MG capsule Take 300 mg by mouth at bedtime. 08/09/20   [provider]  imiquimod (ALDARA) 5 % cream Apply topically at bedtime. Patient taking differently: Apply topically at bedtime. As needed 07/10/20   Lavonna Monarch, MD  Meth-Hyo-M Bl-Na Phos-Ph Sal (URO-MP) 118 MG CAPS TAKE 1 CAPSULE BY MOUTH TWICE A DAY AS NEEDED 05/04/19   McKenzie, Candee Furbish, MD  montelukast (SINGULAIR) 10 MG tablet Take 10 mg by mouth at bedtime.  11/03/13   [provider]  MYRBETRIQ 50 MG TB24 tablet TAKE 1 TABLET DAILY 07/02/19   McKenzie, Candee Furbish, MD  omeprazole (PRILOSEC) 40 MG capsule Take 40 mg by mouth daily. 10/10/16   [provider]  ondansetron (ZOFRAN) 4 MG tablet TAKE 1 TABLET BY MOUTH EVERY 6 HOURS AS NEEDED FOR NAUSEA - IF PHERERGAN NOT WORKING 07/30/17   [provider]  OVER THE COUNTER MEDICATION Take 500 mg by mouth daily. Vitamin B5    [provider]  pravastatin (PRAVACHOL) 10 MG tablet Take 10 mg by mouth daily.    [provider]  predniSONE (DELTASONE) 20 MG tablet Take 1 tablet (20 mg total) by mouth daily with breakfast. Patient not taking: Reported on 03/30/2022 02/20/22   Isla Pence, MD  Probiotic Product (PROBIOTIC DAILY PO) Take 1 tablet by mouth 2 (two) times daily.     [provider]   promethazine (PHENERGAN) 25 MG tablet Take 1 tablet (25 mg total) by mouth every 6 (six) hours as needed for nausea or vomiting. 11/30/15   Lawyer, Harrell Gave, PA-C  promethazine-dextromethorphan (PROMETHAZINE-DM) 6.25-15 MG/5ML syrup Take 5 mLs by mouth 4 (four) times daily as needed for cough. 06/12/21   Loeffler, Adora Fridge, PA-C  pyridoxine (B-6) 200 MG tablet Take 200 mg by mouth daily.    [provider]  rizatriptan (MAXALT-MLT) 10 MG disintegrating tablet TAKE 1 TABLET AT ONSET OF MIGRAINE AND REPEAT IN 2 HOURS IF NEEDED 11/07/21   Ward Givens, NP  tacrolimus (PROTOPIC) 0.1 % ointment Apply topically 2 (two) times daily. 06/29/21   Lavonna Monarch, MD  Vibegron (GEMTESA) 75 MG TABS Take by mouth daily.    [provider]  vortioxetine HBr (TRINTELLIX) 20 MG TABS tablet Take 20 mg by mouth at bedtime.    [provider]  zonisamide (ZONEGRAN) 100 MG capsule TAKE 1 CAPSULE TWICE A DAY 01/23/22   Ward Givens, NP      Allergies    Oat grain extract allergy skin test, Wheat bran, Adhesive [tape], Morphine, Morphine and related, Barley grass, Clonazepam, Desvenlafaxine, Ezetimibe, Hydrocodone bit-homatrop mbr, Linaclotide, Metronidazole, Morphine sulfate, Oatmeal, Other, Theophylline, and Alprazolam    Review of Systems   Review of Systems  Constitutional:  Positive for chills and fatigue. Negative for fever.  Respiratory:  Positive for cough and shortness of breath.   Cardiovascular:  Negative for chest pain and leg swelling.  Gastrointestinal:  Negative for abdominal pain, diarrhea and vomiting.  Skin:  Negative for rash.  Neurological:  Negative for headaches.    Physical Exam Updated Vital Signs BP 119/75 (BP Location: Right Arm)   Pulse 69   Temp 97.7 F (36.5 C) (Oral)   Resp (!) 22   Ht 5' 10"$  (1.778 m)   Wt 79.4 kg   SpO2 100%   BMI 25.11 kg/m  Physical Exam Vitals and nursing note reviewed.  Constitutional:      General: She is not in  acute distress.    Appearance: Normal appearance.  HENT:     Head: Normocephalic.     Mouth/Throat:     Mouth: Mucous membranes are moist.  Cardiovascular:     Rate and Rhythm: Normal rate.  Pulmonary:     Effort: Pulmonary effort is normal. No respiratory distress.     Breath sounds: Examination of the right-lower field reveals decreased breath sounds and wheezing. Examination of the left-lower field reveals decreased breath sounds and wheezing. Decreased breath sounds and wheezing present. No rales.  Abdominal:     Palpations: Abdomen is soft.     Tenderness: There is no abdominal tenderness.  Skin:    General: Skin is warm.  Neurological:     Mental Status: She is alert and oriented to person, place, and time. Mental status is at baseline.  Psychiatric:        Mood and Affect: Mood normal.  ED Results / Procedures / Treatments   Labs (all labs ordered are listed, but only abnormal results are displayed) Labs Reviewed  BASIC METABOLIC PANEL - Abnormal; Notable for the following components:      Result Value   Potassium 3.4 (*)    Glucose, Bld 140 (*)    Creatinine, Ser 1.05 (*)    GFR, Estimated 57 (*)    All other components within normal limits  CBC  TROPONIN I (HIGH SENSITIVITY)  TROPONIN I (HIGH SENSITIVITY)    EKG EKG Interpretation  Date/Time:  Sunday May 21 2022 13:19:55 EST Ventricular Rate:  90 PR Interval:  166 QRS Duration: 75 QT Interval:  354 QTC Calculation: 434 R Axis:   -22 Text Interpretation: Sinus rhythm Borderline left axis deviation Low voltage, precordial leads Consider anterior infarct Confirmed by Josede Cicero ([id number]) on 05/21/2022 5:18:40 PM  Radiology DG Chest 2 View  Result Date: 05/21/2022 CLINICAL DATA:  Chest pain EXAM: CHEST - 2 VIEW COMPARISON:  02/20/2022 and prior studies FINDINGS: The cardiomediastinal silhouette is unremarkable. There is no evidence of focal airspace disease, pulmonary edema, suspicious pulmonary  nodule/mass, pleural effusion, or pneumothorax. No acute bony abnormalities are identified. IMPRESSION: No active cardiopulmonary disease. Electronically Signed   By: Jeffrey  Hu M.D.   On: 05/21/2022 13:42    Procedures Procedures    Medications Ordered in ED Medications - No data to display  ED Course/ Medical Decision Making/ A&P                             Medical Decision Making Risk Prescription drug management.   71 year old female presents emergency department with productive cough for the past 2 weeks.  She is afebrile here, no respiratory distress.  Slightly diminished breath sounds with scattered wheezes.  Chest x-ray is unremarkable, no pneumonia.  Blood work is reassuring.  Believe the patient is suffering from bronchitis.  She will continue her nebulizer treatments at home in addition to antibiotics/steroids.  Patient at this time appears safe and stable for discharge and close outpatient follow up. Discharge plan and strict return to ED precautions discussed, patient verbalizes understanding and agreement.         Final Clinical Impression(s) / ED Diagnoses Final diagnoses:  Bronchitis    Rx / DC Orders ED Discharge Orders          Ordered    methylPREDNISolone (MEDROL DOSEPAK) 4 MG TBPK tablet        05/21/22 1754    doxycycline (VIBRAMYCIN) 100 MG capsule  2 times daily        02$ /11/24 1754              Takima Encina, Alvin Critchley, DO 05/21/22 1802

## 2022-05-24 ENCOUNTER — Telehealth: Payer: Self-pay

## 2022-05-24 ENCOUNTER — Encounter: Payer: Self-pay | Admitting: Physician Assistant

## 2022-05-24 ENCOUNTER — Ambulatory Visit (INDEPENDENT_AMBULATORY_CARE_PROVIDER_SITE_OTHER): Payer: Medicare Other | Admitting: Physician Assistant

## 2022-05-24 VITALS — BP 132/77 | HR 82 | Ht 70.0 in | Wt 179.0 lb

## 2022-05-24 DIAGNOSIS — Z0000 Encounter for general adult medical examination without abnormal findings: Secondary | ICD-10-CM | POA: Diagnosis not present

## 2022-05-24 DIAGNOSIS — M159 Polyosteoarthritis, unspecified: Secondary | ICD-10-CM | POA: Diagnosis not present

## 2022-05-24 DIAGNOSIS — F411 Generalized anxiety disorder: Secondary | ICD-10-CM | POA: Diagnosis not present

## 2022-05-24 DIAGNOSIS — G43909 Migraine, unspecified, not intractable, without status migrainosus: Secondary | ICD-10-CM | POA: Diagnosis not present

## 2022-05-24 DIAGNOSIS — E782 Mixed hyperlipidemia: Secondary | ICD-10-CM | POA: Diagnosis not present

## 2022-05-24 DIAGNOSIS — K219 Gastro-esophageal reflux disease without esophagitis: Secondary | ICD-10-CM | POA: Diagnosis not present

## 2022-05-24 DIAGNOSIS — R5383 Other fatigue: Secondary | ICD-10-CM | POA: Diagnosis not present

## 2022-05-24 DIAGNOSIS — F331 Major depressive disorder, recurrent, moderate: Secondary | ICD-10-CM | POA: Diagnosis not present

## 2022-05-24 DIAGNOSIS — K900 Celiac disease: Secondary | ICD-10-CM | POA: Diagnosis not present

## 2022-05-24 DIAGNOSIS — G629 Polyneuropathy, unspecified: Secondary | ICD-10-CM | POA: Diagnosis not present

## 2022-05-24 DIAGNOSIS — M810 Age-related osteoporosis without current pathological fracture: Secondary | ICD-10-CM | POA: Diagnosis not present

## 2022-05-24 MED ORDER — BUSPIRONE HCL 15 MG PO TABS: ORAL_TABLET | ORAL | 1 refills | Status: DC

## 2022-05-24 MED ORDER — CLONAZEPAM 0.5 MG PO TABS: 0.2500 mg | ORAL_TABLET | Freq: Two times a day (BID) | ORAL | 1 refills | Status: DC | PRN

## 2022-05-24 NOTE — Progress Notes (Signed)
[facility] MD/PA/NP Initial Note  05/24/2022 8:52 AM Katelyn Mcclain  MRN:  [id number]  Chief Complaint:  Chief Complaint   Establish Care    HPI:   Overwhelmed with some things in life. Feels hopeless off and on.  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  Katelyn Mcclain helps her get to sleep.  ADLs and personal hygiene are normal.  Cries easily.  Denies any changes in concentration, making decisions, or remembering things.  Appetite has not changed.  Weight is stable.  Has been on Trintellix for years.  That has helped more than anything else she ever tried. Denies suicidal or homicidal thoughts.  Anxious a lot. Gets aggravated easily, can't watch the news without being really agitated. Not having PA. 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  Is taking Klonopin occasionally, was given a prescription by Katelyn Hinders, NP back in October.  It is effective but she does not like to need it.  Patient denies increased energy with decreased need for sleep, increased talkativeness, racing thoughts, impulsivity or risky behaviors, increased spending, increased libido, grandiosity, increased irritability or anger, paranoia, or hallucinations.   Visit Diagnosis:    ICD-10-CM   1. Generalized anxiety disorder  F41.1     2. Major depressive disorder, recurrent episode, moderate (HCC)  F33.1       Past Psychiatric History:   Past medications for mental health diagnoses include: On Trintellix since 2015, Prozac, Lexapro, maybe others  Was in [facility], [place] on pills, back in the 1980s.  She had several deaths in her family within a few years of each other which was extremely hard.  She was forced to abort a child at 5 months gestation.  Her mom died, then her younger sister died at 63 years old.  States these  things led to the suicide attempt.  She has not had any other mental health hospitalizations or attempts.  Past Medical History:  Past Medical History:  Diagnosis Date   Abscessed tooth    Allergic rhinitis    Asthma    Asthma    inhaler prn   Celiac disease    Depression with anxiety    Dysmenorrhea    Gastroparesis    GERD (gastroesophageal reflux disease)    Hx of low back pain    IBS (irritable bowel syndrome)    Migraine    Migraine without aura, with intractable migraine, so stated, without mention of status migrainosus 05/20/2013   Osteopenia    Rhinovirus 2023    Past Surgical History:  Procedure Laterality Date   ABDOMINAL SURGERY     BREAST BIOPSY Right    x2   BREAST CYST ASPIRATION  1999   COLONOSCOPY     FOOT SURGERY Left    Bunionectomy   MASS EXCISION  08/08/2011   Procedure: MINOR EXCISION OF MASS;  Surgeon: Cammie Sickle., MD;  Location: [facility];  Service: Orthopedics;  Laterality: Left;  left ring excision cyst proximal phalangeal joint   NISSEN FUNDOPLICATION     ROTATOR CUFF REPAIR Right 05/2015   TONSILLECTOMY     TOTAL HIP ARTHROPLASTY Right    June 2023    Family Psychiatric History:   See below  Family History:  Family History  Problem Relation Age  of Onset   Hypertension Mother    Scoliosis Mother    Stroke Father    Dementia Father    Atrial fibrillation Father    Cancer Father    Heart disease Sister        Congenital heart disease   Breast cancer Maternal Grandmother    Migraines Neg Hx     Social History:  Social History   Socioeconomic History   Marital status: Married    Spouse name: Not on file   Number of children: 0   Years of education: college   Highest education level: Associate degree: occupational, Hotel manager, or vocational program  Occupational History    Employer: [employer]    Comment: [employer]  Tobacco Use   Smoking status: Former   Smokeless tobacco: Never   Tobacco  comments:    IN college 1 year  Vaping Use   Vaping Use: Never used  Substance and Sexual Activity   Alcohol use: Not Currently    Comment: wine occasionally   Drug use: No   Sexual activity: Yes    Birth control/protection: Post-menopausal  Other Topics Concern   Not on file  Social History Narrative   Patient lives at home with her husband Katelyn Mcclain).   Patient is retired.   Education AS, in photography   Right handed.       Caffeine: none   [employer]-raised a Manufacturing engineer, now [employer], is Engineer, civil (consulting) none   Social Determinants of Health   Financial Resource Strain: Low Risk  (05/24/2022)   Overall Financial Resource Strain (CARDIA)    Difficulty of Paying Living Expenses: Not hard at all  Food Insecurity: No Food Insecurity (05/24/2022)   Hunger Vital Sign    Worried About Running Out of Food in the Last Year: Never true    [place] in the Last Year: Never true  Transportation Needs: No Transportation Needs (05/24/2022)   PRAPARE - Hydrologist (Medical): No    Lack of Transportation (Non-Medical): No  Physical Activity: Inactive (05/24/2022)   Exercise Vital Sign    Days of Exercise per Week: 0 days    Minutes of Exercise per Session: 0 min  Stress: Stress Concern Present (05/24/2022)   Greenback    Feeling of Stress : Very much  Social Connections: Socially Integrated (05/24/2022)   Social Connection and Isolation Panel [NHANES]    Frequency of Communication with Friends and Family: More than three times a week    Frequency of Social Gatherings with Friends and Family: More than three times a week    Attends Religious Services: More than 4 times per year    Active Member of [employer] or Organizations: Yes    Attends Music therapist: More than 4 times per year    Marital Status: Married    Allergies:  Allergies  Allergen Reactions   Oat Grain  Extract Allergy Skin Test Diarrhea and Nausea And Vomiting   Wheat Bran Other (See Comments) and Diarrhea    Celiac disease   Adhesive [Tape] Other (See Comments)    Tear's skin off    Morphine Other (See Comments)   Morphine And Related Other (See Comments)    Sensitive to narcotics, worsening migraines.   Barley Grass Other (See Comments)    Celiac disease   Also rye   Clonazepam Other (See Comments)    Caused anxiety  Desvenlafaxine Nausea Only   Ezetimibe     Other Reaction(s): arthralgias   Hydrocodone Bit-Homatrop Mbr Other (See Comments)   Linaclotide Other (See Comments)    Caused spastic bladder (reaction to Linzess)  Other Reaction(s): bladder spasms   Metronidazole Other (See Comments)    Black tongue and diarrhea   Morphine Sulfate Other (See Comments)   Oatmeal Other (See Comments)    Celiac disease   Other     Pt does not use artificial sweeteners  Now has anaphylactic allergic to allergy shots due to COVID     Theophylline Nausea Only   Alprazolam Other (See Comments) and Anxiety    Metabolic Disorder Labs: No results found for: "HGBA1C", "MPG" No results found for: "PROLACTIN" No results found for: "CHOL", "TRIG", "HDL", "CHOLHDL", "VLDL", "LDLCALC" Lab Results  Component Value Date   TSH 1.138 10/01/2017    Therapeutic Level Labs: No results found for: "LITHIUM" No results found for: "VALPROATE" No results found for: "CBMZ"  Current Medications: Current Outpatient Medications  Medication Sig Dispense Refill   busPIRone (BUSPAR) 15 MG tablet 1/3 tablet twice daily for 1 week, then increase to 2/3 tablet twice daily for 1 week, then increase to 1 tablet twice daily for anxiety. 60 tablet 1   Calcium-Magnesium-Vitamin D (CALCIUM 1200+D3 PO) Take 1,200 mg by mouth daily.     Coenzyme Q10 100 MG capsule Take 1 capsule by mouth daily.     Denosumab (PROLIA [place]) Inject into the skin. Injection every 6 months     docusate sodium (COLACE) 100 MG capsule  Take 100 mg by mouth 2 (two) times daily.     doxycycline (VIBRAMYCIN) 100 MG capsule Take 1 capsule (100 mg total) by mouth 2 (two) times daily. 14 capsule 0   Eszopiclone 3 MG TABS Take 1.5 mg by mouth at bedtime as needed (sleep). Take immediately before bedtime     gabapentin (NEURONTIN) 100 MG capsule Take 300 mg by mouth at bedtime.     imiquimod (ALDARA) 5 % cream Apply topically at bedtime. (Patient taking differently: Apply topically at bedtime. As needed) 12 each 0   Meth-Hyo-M Bl-Na Phos-Ph Sal (URO-MP) 118 MG CAPS TAKE 1 CAPSULE BY MOUTH TWICE A DAY AS NEEDED 30 capsule 5   methylPREDNISolone (MEDROL DOSEPAK) 4 MG TBPK tablet Take as directed 1 each 0   montelukast (SINGULAIR) 10 MG tablet Take 10 mg by mouth at bedtime.      omeprazole (PRILOSEC) 40 MG capsule Take 40 mg by mouth daily.     ondansetron (ZOFRAN) 4 MG tablet TAKE 1 TABLET BY MOUTH EVERY 6 HOURS AS NEEDED FOR NAUSEA - IF PHERERGAN NOT WORKING  2   OVER THE COUNTER MEDICATION Take 500 mg by mouth daily. Vitamin B5     pravastatin (PRAVACHOL) 10 MG tablet Take 10 mg by mouth daily.     Probiotic Product (PROBIOTIC DAILY PO) Take 1 tablet by mouth 2 (two) times daily.      promethazine (PHENERGAN) 25 MG tablet Take 1 tablet (25 mg total) by mouth every 6 (six) hours as needed for nausea or vomiting. 10 tablet 0   pyridoxine (B-6) 200 MG tablet Take 200 mg by mouth daily.     rizatriptan (MAXALT-MLT) 10 MG disintegrating tablet TAKE 1 TABLET AT ONSET OF MIGRAINE AND REPEAT IN 2 HOURS IF NEEDED 27 tablet 3   Vibegron (GEMTESA) 75 MG TABS Take by mouth daily.     vortioxetine HBr (TRINTELLIX) 20 MG TABS  tablet Take 20 mg by mouth at bedtime.     zonisamide (ZONEGRAN) 100 MG capsule TAKE 1 CAPSULE TWICE A DAY 180 capsule 3   albuterol (PROVENTIL HFA;VENTOLIN HFA) 108 (90 BASE) MCG/ACT inhaler Inhale 1 puff into the lungs every 6 (six) hours as needed for wheezing or shortness of breath (seasonal allergies). Currently doing 2  puffs per day (Patient not taking: Reported on 05/24/2022)     albuterol (PROVENTIL) (2.5 MG/3ML) 0.083% nebulizer solution Take 3 mLs (2.5 mg total) by nebulization every 6 (six) hours as needed for wheezing or shortness of breath. (Patient not taking: Reported on 05/24/2022) 75 mL 0   b complex vitamins capsule Take by mouth. (Patient not taking: Reported on 05/24/2022)     benzonatate (TESSALON) 100 MG capsule Take 1 capsule (100 mg total) by mouth 3 (three) times daily as needed for cough. (Patient not taking: Reported on 05/24/2022) 21 capsule 0   Clobetasol Propionate (IMPOYZ) 0.025 % CREA Apply 1 application topically daily. (Patient taking differently: Apply 1 application  topically as needed.) 60 g 0   clonazePAM (KLONOPIN) 0.5 MG tablet Take 0.5-1 tablets (0.25-0.5 mg total) by mouth 2 (two) times daily as needed for anxiety. 60 tablet 1   FLUAD 0.5 ML SUSY TO BE ADMINISTERED BY PHARMACIST FOR IMMUNIZATION (Patient not taking: Reported on 05/24/2022)     MYRBETRIQ 50 MG TB24 tablet TAKE 1 TABLET DAILY (Patient not taking: Reported on 05/24/2022) 90 tablet 3   potassium gluconate 595 (99 K) MG TABS tablet Take by mouth.     predniSONE (DELTASONE) 20 MG tablet Take 1 tablet (20 mg total) by mouth daily with breakfast. (Patient not taking: Reported on 03/30/2022) 5 tablet 0   tacrolimus (PROTOPIC) 0.1 % ointment Apply topically 2 (two) times daily. (Patient not taking: Reported on 05/24/2022) 100 g 2   Current Facility-Administered Medications  Medication Dose Route Frequency Provider Last Rate Last Admin   ipratropium (ATROVENT) nebulizer solution 0.5 mg  0.5 mg Nebulization Once Dunn, Ryan M, PA-C        Medication Side Effects: none  Orders placed this visit:  No orders of the defined types were placed in this encounter.   Psychiatric Specialty Exam:  Review of Systems  Constitutional:  Positive for fatigue.       From not sleeping enough.  HENT: Negative.    Eyes: Negative.    Respiratory: Negative.    Cardiovascular: Negative.   Gastrointestinal: Negative.   Endocrine: Negative.   Genitourinary: Negative.   Musculoskeletal: Negative.   Allergic/Immunologic: Negative.   Neurological: Negative.   Hematological: Negative.   Psychiatric/Behavioral:         See HPI    Blood pressure 132/77, pulse 82, height '5\' 10"'$  (1.778 m), weight 179 lb (81.2 kg).Body mass index is 25.68 kg/m.  General Appearance: Casual and Well Groomed  Eye Contact:  Good  Speech:  Clear and Coherent and Normal Rate  Volume:  Normal  Mood:  Anxious  Affect:  Tearful and Anxious  Thought Process:  Goal Directed and Descriptions of Associations: Circumstantial  Orientation:  Full (Time, Place, and Person)  Thought Content: Logical   Suicidal Thoughts:  No  Homicidal Thoughts:  No  Memory:  WNL  Judgement:  Good  Insight:  Good  Psychomotor Activity:  Normal  Concentration:  Concentration: Good  Recall:  Good  Fund of Knowledge: Good  Language: Good  Assets:  Desire for Improvement Financial Resources/Insurance Housing Transportation  ADL's:  Intact  Cognition: WNL  Prognosis:  Good   Screenings:  GAD-7    Flowsheet Row Office Visit from 05/24/2022 in [facility]  Total GAD-7 Score 17      PHQ2-9    [place] Office Visit from 05/24/2022 in [facility]  PHQ-2 Total Score 5  PHQ-9 Total Score 11      Flowsheet Row ED from 05/21/2022 in [facility] Emergency Department at [facility] ED from 02/20/2022 in [facility] Emergency Department at [facility] ED from 06/12/2021 in [facility] Emergency Department at [place] No Risk No Risk No Risk      Receiving Psychotherapy: No   Treatment Plan/Recommendations:  PDMP reviewed.  Lunesta filled 04/30/2022.  Klonopin filled 01/18/2022.  Given a small quantity of hydrocodone in December. I provided 60 minutes of  face to face time during this encounter, including time spent before and after the visit in records review, medical decision making, counseling pertinent to today's visit, and charting.   We discussed her symptoms.  She does have depression that seems more reactive than biochemical to me.  I encouraged her to let go of some of the commitments.  The friend's wedding flowers will be completed tomorrow, and the watercolor project will be over next week.  I recommended that she make no other commitments until she is feeling better with the depressive and anxiety symptoms.  I believe that the depression is in part caused by anxiety.  The Klonopin does help her when she takes it.  I recommend adding BuSpar to help prevent the anxiety, but will continue Klonopin as needed.  We discussed benefits, risks and side effects of BuSpar and she would like to try it. Sleep hygiene discussed.  Start BuSpar 15 mg 1/3 tablet twice daily for 1 week, then increase to 2/3 tablet twice daily for 1 week, then increase to 1 tablet twice daily for anxiety. Continue Klonopin 0.5 mg, 1/2-1 p.o. twice daily as needed anxiety. Continue Lunesta 3 mg, 1.5 pills nightly as needed. Continue gabapentin 100 mg, 3 p.o. nightly. Continue Trintellix 20 mg, 1 p.o. daily. Encouraged her to get back on a multivitamin, B complex, vitamin D, and fish oil. Strongly recommend therapy.  Referred to [facility]. Return in 6 weeks.  Donnal Moat, PA-C

## 2022-05-24 NOTE — Patient Outreach (Signed)
  Care Coordination   05/24/2022 Name: THREASA KINCH MRN: [id number] DOB: June 23, 1950   Care Coordination Outreach Attempts:  An unsuccessful telephone outreach was attempted today to offer the patient information about available care coordination services as a benefit of their health plan.   Follow Up Plan:  Additional outreach attempts will be made to offer the patient care coordination information and services.   Encounter Outcome:  No Answer   Care Coordination Interventions:  No, not indicated    Jone Baseman, RN, MSN [place] [facility] Care Management Coordinator Direct Line [phone]

## 2022-05-25 ENCOUNTER — Telehealth: Payer: Self-pay

## 2022-05-25 NOTE — Patient Outreach (Signed)
  Care Coordination   Initial Visit Note   05/25/2022 Name: Katelyn Mcclain MRN: [id number] DOB: 1950/08/15  Katelyn Mcclain is a 72 y.o. year old female who sees Katelyn Seashore, MD for primary care. I spoke with  Katelyn Mcclain by phone today.  What matters to the patients health and wellness today?  none    Goals Addressed             This Visit's Progress    COMPLETED: Care coordination Activities-No follow up required       Interventions Today    Flowsheet Row Most Recent Value  Chronic Disease   Chronic disease during today's visit Other  [Preventive health]  General Interventions   General Interventions Discussed/Reviewed General Interventions Discussed, Doctor Visits, Health Screening  Doctor Visits Discussed/Reviewed Doctor Visits Discussed, Annual Wellness Visits  Health Screening Colonoscopy  Education Interventions   Education Provided Provided Education  Provided Verbal Education On Other  [Preventive Health]              SDOH assessments and interventions completed:  Yes     Care Coordination Interventions:  Yes, provided    Follow up plan: No further intervention required.   Encounter Outcome:  Pt. Visit Completed    Katelyn Baseman, RN, MSN [place] [facility] Care Management Coordinator Direct Line [phone]

## 2022-05-25 NOTE — Patient Instructions (Signed)
Visit Information  Thank you for taking time to visit with me today. Please don't hesitate to contact me if I can be of assistance to you.   Following are the goals we discussed today:   Goals Addressed             This Visit's Progress    COMPLETED: Care coordination Activities-No follow up required       Interventions Today    Flowsheet Row Most Recent Value  Chronic Disease   Chronic disease during today's visit Other  [Preventive health]  General Interventions   General Interventions Discussed/Reviewed General Interventions Discussed, Doctor Visits, Health Screening  Doctor Visits Discussed/Reviewed Doctor Visits Discussed, Annual Wellness Visits  Health Screening Colonoscopy  Education Interventions   Education Provided Provided Education  Provided Verbal Education On Other  [Preventive Health]              If you are experiencing a Mental Health or [place] or need someone to talk to, please call the Suicide and Crisis Lifeline: 988   Patient verbalizes understanding of instructions and care plan provided today and agrees to view in [place]. Active MyChart status and patient understanding of how to access instructions and care plan via MyChart confirmed with patient.     No further follow up required: decline  Jone Baseman, RN, MSN [place] [facility] Care Management Coordinator Direct Line [phone]

## 2022-05-26 ENCOUNTER — Telehealth: Payer: Self-pay | Admitting: Physician Assistant

## 2022-05-26 NOTE — Telephone Encounter (Signed)
Katelyn Mcclain called at 10:15 to report that her anxiety is up.  Last night her chest hurt so bad she almost when to the ER.  She took a whole pill of the Klonopin. It did eventually help.  But she still doesn't feel right today.  She did also start the Buspar and she also has bronchitis.  She is not sure if she if taking the medication correctly or if the medicine itself is causing her problem.  Appt 3/27.  Please call to discuss

## 2022-05-29 NOTE — Telephone Encounter (Signed)
Patient has had bronchitis for an extended period. She is using a nebulizer, unable to use inhalers due to causing anxiety. She c/o chest pain off and on, but has been so severe that she thought she might need to go to ER. Suggested she call PCP.

## 2022-06-07 ENCOUNTER — Ambulatory Visit (INDEPENDENT_AMBULATORY_CARE_PROVIDER_SITE_OTHER): Payer: Medicare Other | Admitting: Behavioral Health

## 2022-06-07 DIAGNOSIS — F411 Generalized anxiety disorder: Secondary | ICD-10-CM | POA: Diagnosis not present

## 2022-06-07 DIAGNOSIS — F331 Major depressive disorder, recurrent, moderate: Secondary | ICD-10-CM | POA: Diagnosis not present

## 2022-06-07 NOTE — Progress Notes (Signed)
[facility] Counselor Initial Adult Exam  Name: Katelyn Mcclain Date: 06/07/2022 MRN: [id number] DOB: 03/07/1951 PCP: Merrilee Seashore, MD  Time spent: 60 minutes    Guardian/Payee:  [name] requested:  No   Reason for Visit /Presenting Problem: The patient presents as a 72 year old married Caucasian female referred to [place] whom was referred by Lilyan Gilford with [place] her Physician Assistant. The patient states interest with being seen due to "My anxiety level is through the roof". The patient reports she is following a prescribed medication regimen of Klonopin, Buspar, and Trintellix. She reports to have a history of being diagnosed with anxiety and depression.      Mental Status Exam:    Appearance:   Casual     Behavior:  Appropriate and Sharing  Motor:  Normal  Speech/Language:   Clear and Coherent  Affect:  Appropriate and Congruent  Mood:  normal  Thought process:  normal  Thought content:    WNL  Sensory/Perceptual disturbances:    WNL  Orientation:  oriented to person  Attention:  Good  Concentration:  Good  Memory:  WNL  Fund of knowledge:   Good  Insight:    Good  Judgment:   Good  Impulse Control:  Good   Reported Symptoms:  Hopelessness/worthlessness, mood changes for no reason, trouble fulfilling responsibilities, trouble managing anger sometimes, and chronic pain.  Risk Assessment: Danger to Self:  No Self-injurious Behavior: No Danger to Others: No Duty to Warn:no Physical Aggression / Violence:No  Access to Firearms a concern: No  Gang Involvement:No  Patient / guardian was educated about steps to take if suicide or homicide risk level increases between visits: yes While future psychiatric events cannot be accurately predicted, the patient does not currently require acute inpatient psychiatric care and does not currently meet [facility] involuntary commitment criteria.  Substance Abuse  History: Current substance abuse: No     Past Psychiatric History:   Previous psychological history is significant for anxiety and depression Outpatient Providers: Physician Assistant, Editor, commissioning, Dentist, Neurologist, Surgerons, GI doctor, Eye doctor, Dealer. History of Psych Hospitalization: Yes  the patient states "I was over at [facility] in the 1980's my sister passed away two days after she had a baby, my best friend was beheaded in car accident, my other friend died, I was pregnant and the baby wasn't developing properly" she states she did not carry her baby full term due to the pregancy was terminated. The patient states she was admitted to [facility] and stayed for several weeks.  Psychological Testing: The patient reports belief that psychological testing was done when she was admitted into [facility].   Abuse History: Victim of No.,  Denied abuse, however the patient reports she was "Overally disciplined" Report needed: No. Victim of Neglect:No. 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  Protective Services Involvement: No  Witness to [employer] Violence:  No   Family History:  Family History  Problem Relation Age of Onset   Hypertension Mother    Scoliosis Mother    Stroke Father    Dementia Father    Atrial fibrillation Father    Cancer Father    Heart disease Sister        Congenital heart disease   Breast cancer Maternal Grandmother    Migraines Neg Hx     Living situation: the patient  lives with their spouse.   Sexual Orientation:  Straight  Relationship Status: married  Name of spouse / other: Katelyn Mcclain              If a parent, number of children / ages: The patient states she does not have any children.   Support Systems; Currently no supports are identfied.   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 The patient reports retiring from [employer] and [employer].   Military Service: No   Educational History: Education: some college  Religion/Sprituality/World View:    The patient states "Christianity".   Any cultural differences that may affect / interfere with treatment:  Not applicable   Recreation/Hobbies: Camping and paint with water colors.   Stressors:The patient reports she is currently experiencing stress due to her community volunteer roles. The patient reports to feel obligated to assist others in her friendship circle. In addition, she reports to provide water color classes in [place], [place].   Strengths:  [employer], Hopefulness, and Able to [employer].  Barriers:  Teacher, English as a foreign language History: Pending legal issue / charges: The patient has no significant history of legal issues. History of legal issue / charges:  Denied   Medical History/Surgical History:reviewed Past Medical History:  Diagnosis Date   Abscessed tooth    Allergic rhinitis    Asthma    Asthma    inhaler prn   Celiac disease    Depression with anxiety    Dysmenorrhea    Gastroparesis    GERD (gastroesophageal reflux disease)    Hx of low back pain    IBS (irritable bowel syndrome)    Migraine    Migraine without aura, with intractable migraine, so stated, without mention of status migrainosus 05/20/2013   Osteopenia    Rhinovirus 2023    Past Surgical History:  Procedure Laterality Date   ABDOMINAL SURGERY     BREAST BIOPSY Right    x2   BREAST CYST ASPIRATION  1999   COLONOSCOPY     FOOT SURGERY Left    Bunionectomy   MASS EXCISION  08/08/2011   Procedure: MINOR EXCISION OF MASS;  Surgeon: Cammie Sickle., MD;  Location: [facility];  Service: Orthopedics;  Laterality: Left;  left ring excision cyst proximal phalangeal joint   NISSEN FUNDOPLICATION     ROTATOR CUFF REPAIR Right 05/2015   TONSILLECTOMY     TOTAL HIP  ARTHROPLASTY Right    June 2023    Medications: Current Outpatient Medications  Medication Sig Dispense Refill   albuterol (PROVENTIL HFA;VENTOLIN HFA) 108 (90 BASE) MCG/ACT inhaler Inhale 1 puff into the lungs every 6 (six) hours as needed for wheezing or shortness of breath (seasonal allergies). Currently doing 2 puffs per day (Patient not taking: Reported on 05/24/2022)     albuterol (PROVENTIL) (2.5 MG/3ML) 0.083% nebulizer solution Take 3 mLs (2.5 mg total) by nebulization every 6 (six) hours as needed for wheezing or shortness of breath. (Patient not taking: Reported on 05/24/2022) 75 mL 0   b complex vitamins capsule Take by mouth. (Patient not taking: Reported on 05/24/2022)     benzonatate (TESSALON) 100 MG capsule Take 1 capsule (100 mg total) by mouth 3 (three) times daily as needed for cough. (Patient not taking: Reported on 05/24/2022) 21 capsule 0   busPIRone (BUSPAR) 15 MG tablet 1/3 tablet twice daily for 1 week, then increase to 2/3 tablet twice daily for  1 week, then increase to 1 tablet twice daily for anxiety. 60 tablet 1   Calcium-Magnesium-Vitamin D (CALCIUM 1200+D3 PO) Take 1,200 mg by mouth daily.     Clobetasol Propionate (IMPOYZ) 0.025 % CREA Apply 1 application topically daily. (Patient taking differently: Apply 1 application  topically as needed.) 60 g 0   clonazePAM (KLONOPIN) 0.5 MG tablet Take 0.5-1 tablets (0.25-0.5 mg total) by mouth 2 (two) times daily as needed for anxiety. 60 tablet 1   Coenzyme Q10 100 MG capsule Take 1 capsule by mouth daily.     Denosumab (PROLIA Formoso) Inject into the skin. Injection every 6 months     docusate sodium (COLACE) 100 MG capsule Take 100 mg by mouth 2 (two) times daily.     doxycycline (VIBRAMYCIN) 100 MG capsule Take 1 capsule (100 mg total) by mouth 2 (two) times daily. 14 capsule 0   Eszopiclone 3 MG TABS Take 1.5 mg by mouth at bedtime as needed (sleep). Take immediately before bedtime     FLUAD 0.5 ML SUSY TO BE ADMINISTERED BY  PHARMACIST FOR IMMUNIZATION (Patient not taking: Reported on 05/24/2022)     gabapentin (NEURONTIN) 100 MG capsule Take 300 mg by mouth at bedtime.     imiquimod (ALDARA) 5 % cream Apply topically at bedtime. (Patient taking differently: Apply topically at bedtime. As needed) 12 each 0   Meth-Hyo-M Bl-Na Phos-Ph Sal (URO-MP) 118 MG CAPS TAKE 1 CAPSULE BY MOUTH TWICE A DAY AS NEEDED 30 capsule 5   methylPREDNISolone (MEDROL DOSEPAK) 4 MG TBPK tablet Take as directed 1 each 0   montelukast (SINGULAIR) 10 MG tablet Take 10 mg by mouth at bedtime.      MYRBETRIQ 50 MG TB24 tablet TAKE 1 TABLET DAILY (Patient not taking: Reported on 05/24/2022) 90 tablet 3   omeprazole (PRILOSEC) 40 MG capsule Take 40 mg by mouth daily.     ondansetron (ZOFRAN) 4 MG tablet TAKE 1 TABLET BY MOUTH EVERY 6 HOURS AS NEEDED FOR NAUSEA - IF PHERERGAN NOT WORKING  2   OVER THE COUNTER MEDICATION Take 500 mg by mouth daily. Vitamin B5     potassium gluconate 595 (99 K) MG TABS tablet Take by mouth.     pravastatin (PRAVACHOL) 10 MG tablet Take 10 mg by mouth daily.     predniSONE (DELTASONE) 20 MG tablet Take 1 tablet (20 mg total) by mouth daily with breakfast. (Patient not taking: Reported on 03/30/2022) 5 tablet 0   Probiotic Product (PROBIOTIC DAILY PO) Take 1 tablet by mouth 2 (two) times daily.      promethazine (PHENERGAN) 25 MG tablet Take 1 tablet (25 mg total) by mouth every 6 (six) hours as needed for nausea or vomiting. 10 tablet 0   pyridoxine (B-6) 200 MG tablet Take 200 mg by mouth daily.     rizatriptan (MAXALT-MLT) 10 MG disintegrating tablet TAKE 1 TABLET AT ONSET OF MIGRAINE AND REPEAT IN 2 HOURS IF NEEDED 27 tablet 3   tacrolimus (PROTOPIC) 0.1 % ointment Apply topically 2 (two) times daily. (Patient not taking: Reported on 05/24/2022) 100 g 2   Vibegron (GEMTESA) 75 MG TABS Take by mouth daily.     vortioxetine HBr (TRINTELLIX) 20 MG TABS tablet Take 20 mg by mouth at bedtime.     zonisamide (ZONEGRAN) 100  MG capsule TAKE 1 CAPSULE TWICE A DAY 180 capsule 3   Current Facility-Administered Medications  Medication Dose Route Frequency Provider Last Rate Last Admin   ipratropium (ATROVENT)  nebulizer solution 0.5 mg  0.5 mg Nebulization Once Dunn, Ryan M, PA-C        Allergies  Allergen Reactions   Oat Grain Extract Allergy Skin Test Diarrhea and Nausea And Vomiting   Wheat Bran Other (See Comments) and Diarrhea    Celiac disease   Adhesive [Tape] Other (See Comments)    Tear's skin off    Morphine Other (See Comments)   Morphine And Related Other (See Comments)    Sensitive to narcotics, worsening migraines.   Barley Grass Other (See Comments)    Celiac disease   Also rye   Clonazepam Other (See Comments)    Caused anxiety   Desvenlafaxine Nausea Only   Ezetimibe     Other Reaction(s): arthralgias   Hydrocodone Bit-Homatrop Mbr Other (See Comments)   Linaclotide Other (See Comments)    Caused spastic bladder (reaction to Linzess)  Other Reaction(s): bladder spasms   Metronidazole Other (See Comments)    Black tongue and diarrhea   Morphine Sulfate Other (See Comments)   Oatmeal Other (See Comments)    Celiac disease   Other     Pt does not use artificial sweeteners  Now has anaphylactic allergic to allergy shots due to COVID     Theophylline Nausea Only   Alprazolam Other (See Comments) and Anxiety    Diagnoses:    ICD-10-CM   1. Generalized anxiety disorder  F41.1     2. Major depressive disorder, recurrent episode, moderate ([place])  F33.1       Plan of Care:   The patient reports "Not to be a perfectionist, to be content with myself".   1) Long Term Goal: Develop strategies to reduce symptoms.     Short Term Goal: Reduce anxiety and improve coping skills.     Objective: Manage panic attacks.      Objective: Learn two new ways of coping with routine stressors.     Objective: Report feeling more positive about self and abilities during therapy sessions.  2) Long  Term Goal: Alleviate depressive symptoms and return to previous level of effective functioning.      Short Term Goal: Improve overall mood.      Objective: Get through a day/week without a crying spell.       Objective: Develop strategies for thought distraction when ruminating on the past.     Objective: Report feelling happy/better overall mood.   The patient states she is unable to determine the frequency of attending therapy at this time due to she needs to get in touch with her insurance company. She reports plans to call at a later time to schedule an appointment.     Jannifer Hick, [facility]

## 2022-06-08 ENCOUNTER — Encounter: Payer: Self-pay | Admitting: Behavioral Health

## 2022-06-15 ENCOUNTER — Other Ambulatory Visit: Payer: Self-pay | Admitting: Physician Assistant

## 2022-06-20 ENCOUNTER — Ambulatory Visit (INDEPENDENT_AMBULATORY_CARE_PROVIDER_SITE_OTHER): Payer: Medicare Other | Admitting: Behavioral Health

## 2022-06-20 DIAGNOSIS — F331 Major depressive disorder, recurrent, moderate: Secondary | ICD-10-CM

## 2022-06-20 DIAGNOSIS — F411 Generalized anxiety disorder: Secondary | ICD-10-CM

## 2022-06-20 NOTE — Progress Notes (Unsigned)
[facility] Counselor/Therapist Progress Note  Patient ID: Katelyn Mcclain, MRN: [id number],    Date: 06/22/2022  Time Spent: 60 minutes   Treatment Type: Psychotherapy  Reported Symptoms: Depression   Mental Status Exam:  Appearance:   Casual     Behavior:  Appropriate and Sharing  Motor:  Normal  Speech/Language:   Clear and Coherent  Affect:  Appropriate and Congruent  Mood:  dysthymic and sad  Thought process:  tangential  Thought content:    WNL  Sensory/Perceptual disturbances:    WNL  Orientation:  oriented to person  Attention:  Good  Concentration:  Good  Memory:  WNL  Fund of knowledge:   Good  Insight:    Good  Judgment:   Good  Impulse Control:  Good   Risk Assessment: Danger to Self:  No Self-injurious Behavior: No Danger to Others: No Duty to Warn:no Physical Aggression / Violence:No  Access to Firearms a concern: No  Gang Involvement:No   Subjective:   The patient reports since last being seen "I feel like I am not improving what so ever". She reports to continue to have crying spells. She reports she is medication compliant. She states she is following a prescribed medication regimen of Buspar and Clonazepam. She states she has been following her prescribed medication regimen for approximately a month. She states belief that the medication has been helpful with her anxiety.   The patient reports "Art is my thing, I got a water color class I go to, workshops and its like I can care less". The patient reports to experience feelings of being a failure due to her attempt of being a perfectionist. The patient reports to experience thoughts of "Worthless, I feel like what I do is not good enough". She reports she has been sleeping excessively and reports to have low energy. The patient identified March 1st as the anniversary date of her mother's death. The patient rated her depression at a 6 on a scale of 0 to 10. She states "I am not happy there is  nothing that makes me happy and painting use to". She described anxiety as "It comes and goes" she denied experiencing any panic attacks since last being seen by this counselor. In addition, she reports she is experiencing chronic pain she states to have a scheduled appointment with her surgeon soon approaching. She reports utilizing coping strategies of completing word search puzzles, watch television and playing electronic games on her mobile phone. She identified cognitive distortions as all or nothing thinking, disqualifying the positives, labeling and mislabeling, mind reading, and fortune telling. She identified thinking errors as ignoring the good, setting the bar to high, and self blame. The patient identified her take away from today's session as challenging her negative thoughts. She denied SI/HI, AH/VH.   Counselor conducted check in. Counselor discussed mental health symptoms and requested for the patient to rate the symptoms. Counselor questioned coping strategies utilized. Counselor discussed medication compliance. Counselor educated the patient on thinking errors and cognitive distortions and requested feedback from the patient on what thinking errors she identified with. Counselor educated the patient on how to reframe negative thoughts. Counselor discussed utilizing positive self talk. Counselor questioned what the patient does in remembrance of her deceased love one and discussed grief and loss coping strategies. Counselor assessed for SI/HI, AH/VH.     Interventions: Cognitive Behavioral Therapy, Solution-Oriented/Positive Psychology, and Grief Therapy  Diagnosis:   ICD-10-CM   1. Major depressive disorder, recurrent  episode, moderate (H[facility])  F33.1     2. Generalized anxiety disorder  F41.1       Plan:   The patient reports "Not to be a perfectionist, to be content with myself".    1) Long Term Goal: Develop strategies to reduce symptoms.     Short Term Goal: Reduce anxiety and  improve coping skills.     Objective: Manage panic attacks.      Objective: Learn two new ways of coping with routine stressors.     Objective: Report feeling more positive about self and abilities during therapy sessions.   2) Long Term Goal: Alleviate depressive symptoms and return to previous level of effective functioning.      Short Term Goal: Improve overall mood.      Objective: Get through a day/week without a crying spell.       Objective: Develop strategies for thought distraction when ruminating on the past.     Objective: Report feelling happy/better overall mood.    Jannifer Hick, [facility]

## 2022-06-21 DIAGNOSIS — N1831 Chronic kidney disease, stage 3a: Secondary | ICD-10-CM | POA: Diagnosis not present

## 2022-06-21 DIAGNOSIS — M810 Age-related osteoporosis without current pathological fracture: Secondary | ICD-10-CM | POA: Diagnosis not present

## 2022-06-21 DIAGNOSIS — J309 Allergic rhinitis, unspecified: Secondary | ICD-10-CM | POA: Diagnosis not present

## 2022-06-21 DIAGNOSIS — F411 Generalized anxiety disorder: Secondary | ICD-10-CM | POA: Diagnosis not present

## 2022-06-21 DIAGNOSIS — N3010 Interstitial cystitis (chronic) without hematuria: Secondary | ICD-10-CM | POA: Diagnosis not present

## 2022-06-21 DIAGNOSIS — K900 Celiac disease: Secondary | ICD-10-CM | POA: Diagnosis not present

## 2022-06-21 DIAGNOSIS — E782 Mixed hyperlipidemia: Secondary | ICD-10-CM | POA: Diagnosis not present

## 2022-06-21 DIAGNOSIS — J45909 Unspecified asthma, uncomplicated: Secondary | ICD-10-CM | POA: Diagnosis not present

## 2022-06-22 ENCOUNTER — Telehealth: Payer: Self-pay | Admitting: Physician Assistant

## 2022-06-22 ENCOUNTER — Encounter: Payer: Self-pay | Admitting: Behavioral Health

## 2022-06-22 NOTE — Telephone Encounter (Signed)
LVM to RC 

## 2022-06-22 NOTE — Telephone Encounter (Signed)
Pt called and said that she can't stop crying. She wants to know can her anti deppresent be changed. She has moved her appointment to Monday. Please give her a call at [phone]

## 2022-06-22 NOTE — Telephone Encounter (Signed)
I can't start a new medication without seeing her and discussing it first.  Is she having SI? Go to [facility] if so.  Make sure she is back on her multivitamin, B complex, vitamin D, and fish oil.

## 2022-06-23 NOTE — Telephone Encounter (Signed)
Patient denied SI. She said she just has bouts of crying, but feels like she will be okay until seen on Monday. She is taking MVI, B complex, and vitamin D, but is not taking fish oil. She said a doctor told her several years ago that it can raise your cholesterol so she doesn't take it.

## 2022-06-23 NOTE — Telephone Encounter (Signed)
Noted  

## 2022-06-26 ENCOUNTER — Encounter: Payer: Self-pay | Admitting: Physician Assistant

## 2022-06-26 ENCOUNTER — Ambulatory Visit (INDEPENDENT_AMBULATORY_CARE_PROVIDER_SITE_OTHER): Payer: Medicare Other | Admitting: Physician Assistant

## 2022-06-26 DIAGNOSIS — F331 Major depressive disorder, recurrent, moderate: Secondary | ICD-10-CM | POA: Diagnosis not present

## 2022-06-26 DIAGNOSIS — F411 Generalized anxiety disorder: Secondary | ICD-10-CM

## 2022-06-26 DIAGNOSIS — G4700 Insomnia, unspecified: Secondary | ICD-10-CM

## 2022-06-26 DIAGNOSIS — M810 Age-related osteoporosis without current pathological fracture: Secondary | ICD-10-CM | POA: Diagnosis not present

## 2022-06-26 MED ORDER — DULOXETINE HCL 30 MG PO CPEP: 30.0000 mg | ORAL_CAPSULE | Freq: Every day | ORAL | 0 refills | Status: DC

## 2022-06-26 MED ORDER — DULOXETINE HCL 60 MG PO CPEP: 60.0000 mg | ORAL_CAPSULE | Freq: Every day | ORAL | 1 refills | Status: DC

## 2022-06-26 NOTE — Patient Instructions (Signed)
On the Trintellix 10 mg take 1/2 pill daily for 1 week and then stop.

## 2022-06-26 NOTE — Progress Notes (Unsigned)
[facility]  Patient ID: Katelyn Mcclain,  MRN: [id number]  PCP: Merrilee Seashore, MD  Date of Evaluation: 06/27/2022 Time spent:30 minutes  Chief Complaint:  Chief Complaint   Depression; Anxiety; Follow-up    HISTORY/CURRENT STATUS: HPI For routine med check  At the initial visit 1 month ago BuSpar was started.  States the anxiety is quite a bit better. Doesn't feel as on edge as she did. No PA.   However she's been more depressed, crying easily for no reason.  She still does all the things she has to do but does not have a lot of energy or motivation.  Personal hygiene is normal.  Appetite is normal and weight is stable.  Sleeps well most of the time. ADLs and personal hygiene are normal.   Denies any changes in concentration, making decisions, or remembering things.  Denies suicidal or homicidal thoughts.  Patient denies increased energy with decreased need for sleep, increased talkativeness, racing thoughts, impulsivity or risky behaviors, increased spending, increased libido, grandiosity, increased irritability or anger, paranoia, or hallucinations.  Denies dizziness, syncope, seizures, numbness, tingling, tremor, tics, unsteady gait, slurred speech, confusion. Denies muscle or joint pain, stiffness, or dystonia.  Individual Medical History/ Review of Systems: Changes? :No   Past Psychiatric History:    Past medications for mental health diagnoses include: On Trintellix since 2015, Prozac, Lexapro, maybe others   Was in [facility], [place] on pills, back in the 1980s.  She had several deaths in her family within a few years of each other which was extremely hard.  She was forced to abort a child at 5 months gestation.  Her mom died, then her younger sister died at 69 years old.  States these things led to the suicide attempt.  She has not had any other mental health hospitalizations or attempts.  Allergies: Oat grain extract allergy skin test, Wheat, Adhesive  [tape], Morphine, Morphine and related, Barley grass, Clonazepam, Desvenlafaxine, Ezetimibe, Hydrocodone bit-homatrop mbr, Linaclotide, Metronidazole, Morphine sulfate, Oatmeal, Other, Theophylline, and Alprazolam  Current Medications:  Current Outpatient Medications:    busPIRone (BUSPAR) 15 MG tablet, TAKE 1  TABLET TWICE A DAY FOR ANXIETY, Disp: 60 tablet, Rfl: 0   Calcium-Magnesium-Vitamin D (CALCIUM 1200+D3 PO), Take 1,200 mg by mouth daily., Disp: , Rfl:    clonazePAM (KLONOPIN) 0.5 MG tablet, Take 0.5-1 tablets (0.25-0.5 mg total) by mouth 2 (two) times daily as needed for anxiety., Disp: 60 tablet, Rfl: 1   Coenzyme Q10 100 MG capsule, Take 1 capsule by mouth daily., Disp: , Rfl:    Denosumab (PROLIA [place]), Inject into the skin. Injection every 6 months, Disp: , Rfl:    docusate sodium (COLACE) 100 MG capsule, Take 100 mg by mouth 2 (two) times daily., Disp: , Rfl:    DULoxetine (CYMBALTA) 30 MG capsule, Take 1 capsule (30 mg total) by mouth daily., Disp: 14 capsule, Rfl: 0   DULoxetine (CYMBALTA) 60 MG capsule, Take 1 capsule (60 mg total) by mouth daily. Start after taking 30 mg daily for 2 weeks., Disp: 30 capsule, Rfl: 1   Eszopiclone 3 MG TABS, Take 1.5 mg by mouth at bedtime as needed (sleep). Take immediately before bedtime, Disp: , Rfl:    gabapentin (NEURONTIN) 100 MG capsule, Take 300 mg by mouth at bedtime., Disp: , Rfl:    Meth-Hyo-M Bl-Na Phos-Ph Sal (URO-MP) 118 MG CAPS, TAKE 1 CAPSULE BY MOUTH TWICE A DAY AS NEEDED, Disp: 30 capsule, Rfl: 5   montelukast (SINGULAIR) 10  MG tablet, Take 10 mg by mouth at bedtime. , Disp: , Rfl:    omeprazole (PRILOSEC) 40 MG capsule, Take 40 mg by mouth daily., Disp: , Rfl:    ondansetron (ZOFRAN) 4 MG tablet, TAKE 1 TABLET BY MOUTH EVERY 6 HOURS AS NEEDED FOR NAUSEA - IF PHERERGAN NOT WORKING, Disp: , Rfl: 2   OVER THE COUNTER MEDICATION, Take 500 mg by mouth daily. Vitamin B5, Disp: , Rfl:    potassium gluconate 595 (99 K) MG TABS tablet, Take  by mouth., Disp: , Rfl:    pravastatin (PRAVACHOL) 10 MG tablet, Take 10 mg by mouth daily., Disp: , Rfl:    Probiotic Product (PROBIOTIC DAILY PO), Take 1 tablet by mouth 2 (two) times daily. , Disp: , Rfl:    promethazine (PHENERGAN) 25 MG tablet, Take 1 tablet (25 mg total) by mouth every 6 (six) hours as needed for nausea or vomiting., Disp: 10 tablet, Rfl: 0   pyridoxine (B-6) 200 MG tablet, Take 200 mg by mouth daily., Disp: , Rfl:    rizatriptan (MAXALT-MLT) 10 MG disintegrating tablet, TAKE 1 TABLET AT ONSET OF MIGRAINE AND REPEAT IN 2 HOURS IF NEEDED, Disp: 27 tablet, Rfl: 3   Vibegron (GEMTESA) 75 MG TABS, Take by mouth daily., Disp: , Rfl:    zonisamide (ZONEGRAN) 100 MG capsule, TAKE 1 CAPSULE TWICE A DAY, Disp: 180 capsule, Rfl: 3   albuterol (PROVENTIL HFA;VENTOLIN HFA) 108 (90 BASE) MCG/ACT inhaler, Inhale 1 puff into the lungs every 6 (six) hours as needed for wheezing or shortness of breath (seasonal allergies). Currently doing 2 puffs per day (Patient not taking: Reported on 05/24/2022), Disp: , Rfl:    albuterol (PROVENTIL) (2.5 MG/3ML) 0.083% nebulizer solution, Take 3 mLs (2.5 mg total) by nebulization every 6 (six) hours as needed for wheezing or shortness of breath. (Patient not taking: Reported on 05/24/2022), Disp: 75 mL, Rfl: 0   b complex vitamins capsule, Take by mouth. (Patient not taking: Reported on 05/24/2022), Disp: , Rfl:    benzonatate (TESSALON) 100 MG capsule, Take 1 capsule (100 mg total) by mouth 3 (three) times daily as needed for cough. (Patient not taking: Reported on 05/24/2022), Disp: 21 capsule, Rfl: 0   Clobetasol Propionate (IMPOYZ) 0.025 % CREA, Apply 1 application topically daily. (Patient not taking: Reported on 06/26/2022), Disp: 60 g, Rfl: 0   doxycycline (VIBRAMYCIN) 100 MG capsule, Take 1 capsule (100 mg total) by mouth 2 (two) times daily., Disp: 14 capsule, Rfl: 0   FLUAD 0.5 ML SUSY, TO BE ADMINISTERED BY PHARMACIST FOR IMMUNIZATION (Patient not  taking: Reported on 05/24/2022), Disp: , Rfl:    imiquimod (ALDARA) 5 % cream, Apply topically at bedtime. (Patient not taking: Reported on 06/26/2022), Disp: 12 each, Rfl: 0   methylPREDNISolone (MEDROL DOSEPAK) 4 MG TBPK tablet, Take as directed, Disp: 1 each, Rfl: 0   MYRBETRIQ 50 MG TB24 tablet, TAKE 1 TABLET DAILY (Patient not taking: Reported on 05/24/2022), Disp: 90 tablet, Rfl: 3   predniSONE (DELTASONE) 20 MG tablet, Take 1 tablet (20 mg total) by mouth daily with breakfast. (Patient not taking: Reported on 03/30/2022), Disp: 5 tablet, Rfl: 0   tacrolimus (PROTOPIC) 0.1 % ointment, Apply topically 2 (two) times daily. (Patient not taking: Reported on 05/24/2022), Disp: 100 g, Rfl: 2  Current Facility-Administered Medications:    ipratropium (ATROVENT) nebulizer solution 0.5 mg, 0.5 mg, Nebulization, Once, Dunn, Ryan M, PA-C Medication Side Effects: none  Family Medical/ Social History: Changes? No  MENTAL  HEALTH EXAM:  There were no vitals taken for this visit.There is no height or weight on file to calculate BMI.  General Appearance: Casual and Well Groomed  Eye Contact:  Good  Speech:  Clear and Coherent and Normal Rate  Volume:  Normal  Mood:  Depressed  Affect:  Congruent  Thought Process:  Goal Directed and Descriptions of Associations: Circumstantial  Orientation:  Full (Time, Place, and Person)  Thought Content: Logical   Suicidal Thoughts:  No  Homicidal Thoughts:  No  Memory:  WNL  Judgement:  Good  Insight:  Good  Psychomotor Activity:  Normal  Concentration:  Concentration: Good  Recall:  Good  Fund of Knowledge: Good  Language: Good  Assets:  Desire for Improvement Financial Resources/Insurance Housing Transportation Vocational/Educational  ADL's:  Intact  Cognition: WNL  Prognosis:  Good   Labs 08/23/2021 CBC with differential, WBC, hemoglobin and hematocrit, platelet count all normal. Hemoglobin A1c 5.7  05/24/2022 CMP LFTs normal, glucose 101,  electrolytes normal, BUN and creatinine normal Lipid panel total cholesterol 281, HDL 88, LDL 172, triglycerides 104 TSH 1.0  DIAGNOSES: No diagnosis found.  Receiving Psychotherapy: No   RECOMMENDATIONS:  PDMP reviewed. Lunesta filled 04/30/2022. Klonopin filled 05/24/2022. Gabapentin filled 06/22/2022.  I provided 30 minutes of face to face time during this encounter, including time spent before and after the visit in records review, medical decision making, counseling pertinent to today's visit, and charting.    Wean off trintellix 5 mg daily for 1 wk, then stop.    Donnal Moat, PA-C

## 2022-06-27 ENCOUNTER — Encounter: Payer: Self-pay | Admitting: Behavioral Health

## 2022-06-27 ENCOUNTER — Ambulatory Visit (INDEPENDENT_AMBULATORY_CARE_PROVIDER_SITE_OTHER): Payer: Medicare Other | Admitting: Behavioral Health

## 2022-06-27 DIAGNOSIS — F411 Generalized anxiety disorder: Secondary | ICD-10-CM | POA: Diagnosis not present

## 2022-06-27 DIAGNOSIS — F331 Major depressive disorder, recurrent, moderate: Secondary | ICD-10-CM | POA: Diagnosis not present

## 2022-06-27 NOTE — Progress Notes (Signed)
[facility] Counselor/Therapist Progress Note  Patient ID: Katelyn Mcclain, MRN: [id number],    Date: 06/27/2022  Time Spent: 60 minutes  Treatment Type: Psychotherapy  Reported Symptoms: Anxiety and depression  Mental Status Exam:  Appearance:   Casual     Behavior:  Appropriate and Sharing  Motor:  Normal  Speech/Language:   Clear and Coherent  Affect:  Tearful  Mood:  sad  Thought process:  normal  Thought content:    WNL  Sensory/Perceptual disturbances:    WNL  Orientation:  oriented to person  Attention:  Good  Concentration:  Good  Memory:  WNL  Fund of knowledge:   Good  Insight:    Good  Judgment:   Good  Impulse Control:  Good   Risk Assessment: Danger to Self:  No Self-injurious Behavior: No Danger to Others: No Duty to Warn:no Physical Aggression / Violence:No  Access to Firearms a concern: No  Gang Involvement:No   Subjective:   The patient reports since last being seen "The depression is getting worst". She reports she is medication compliant and states an adjustment recently occurred with her prescribed medication regimen. She reports she began taking Cymbalta last night in addition to Trintellix and Buspar. She reports she will gradually discontinue Trintellix. She reports to use Klonopin as needed. She rated depression at a 0 and anxiety at a 3 on a scale of 0 to 10 with 10 being severe. She states she rated anxiety low due to being able to identify what is in her control and out of her control. 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 The patient reports she is attempting to cope with her moods by practicing staying in the present moment, challenging her irrational thoughts and utilizing positive self talk. She states implementing positive self talk as "We been down this road before it is not working". She reports experiencing sadness and having thoughts of "I am a poor pitiful  female". She identified her last crying spell as 06/24/2022 she identified negative thoughts as a trigger. The patient states attempting to cope by reframing her negative thoughts. The patient became tearful in session. The patient identified in session what is in her control as "Just getting thru the day" and states what is out of her control as "I can't control the future". The patient identified her take away from today's session as "I definitely need self care, and redirecting my thoughts".   Counselor conducted check in. Counselor discussed mental health symptoms experienced. Counselor requested for the patient to rate her symptoms. Counselor discussed coping strategies utilized. Counselor utilized reflective listening and validated the patient. Counselor discussed practicing self care. Counselor questioned what is in the patients control and out of her control. Counselor discussed implementing gratitude. Counselor discussed challenging negative thoughts. Counselor assessed for SI/HI, AH/VH. Counselor informed the patient her time is limited with [facility] and informed her what counselor she will transition to due to her interest with continuing with therapy at [facility].   Interventions: Cognitive Behavioral Therapy  Diagnosis:   ICD-10-CM   1. Generalized anxiety disorder  F41.1     2. Major depressive disorder, recurrent episode, moderate (HCC)  F33.1       Plan:   The patient reports "Not to be a perfectionist, to be content with myself".    1) Long Term Goal: Develop strategies to reduce symptoms.  Short Term Goal: Reduce anxiety and improve coping skills.     Objective: Manage panic attacks.      Objective: Learn two new ways of coping with routine stressors.     Objective: Report feeling more positive about self and abilities during therapy sessions.   2) Long Term Goal: Alleviate depressive symptoms and return to previous level of effective  functioning.      Short Term Goal: Improve overall mood.      Objective: Get through a day/week without a crying spell.       Objective: Develop strategies for thought distraction when ruminating on the past.     Objective: Report feelling happy/better overall mood.     Jannifer Hick, [facility]

## 2022-06-28 ENCOUNTER — Ambulatory Visit: Payer: Medicare Other | Admitting: Physician Assistant

## 2022-07-05 ENCOUNTER — Ambulatory Visit: Payer: Medicare Other | Admitting: Physician Assistant

## 2022-07-06 ENCOUNTER — Ambulatory Visit (INDEPENDENT_AMBULATORY_CARE_PROVIDER_SITE_OTHER): Payer: Medicare Other | Admitting: Behavioral Health

## 2022-07-06 DIAGNOSIS — F411 Generalized anxiety disorder: Secondary | ICD-10-CM

## 2022-07-06 DIAGNOSIS — F331 Major depressive disorder, recurrent, moderate: Secondary | ICD-10-CM | POA: Diagnosis not present

## 2022-07-06 NOTE — Progress Notes (Signed)
[facility] Counselor/Therapist Progress Note  Patient ID: Katelyn Mcclain, MRN: [id number],    Date: 07/06/2022  Time Spent: 60 minutes   Treatment Type: Psychotherapy  Reported Symptoms: None reported   Mental Status Exam:  Appearance:   Casual     Behavior:  Appropriate and Sharing  Motor:  Normal  Speech/Language:   Clear and Coherent  Affect:  Appropriate and Congruent  Mood:  normal  Thought process:  normal  Thought content:    WNL  Sensory/Perceptual disturbances:    WNL  Orientation:  oriented to person  Attention:  Good  Concentration:  Good  Memory:  WNL  Fund of knowledge:   Good  Insight:    Good  Judgment:   Good  Impulse Control:  Good   Risk Assessment: Danger to Self:  No Self-injurious Behavior: No Danger to Others: No Duty to Warn:no Physical Aggression / Violence:No  Access to Firearms a concern: No  Gang Involvement:No   Subjective:   The patient reports since last being seen "I am doing very good". She reports she is medication compliant with Cymbalta and states the medication is helping. She denied experiencing any side effects. She states her anxiety has improved she rated it at a 0 today on a scale of 0 to 10 with 10 being severe. She rated depression at a 0 today on a scale of 0 to 10 with 10 being severe. She denied experiencing any crying spells. The patient reports her moods have improved and states she no longer needs therapy. The patient expressed gratitude with improving her thought patterns. She reviewed common thinking errors with this counselor and cognitive distortions. She identified cognitive distortions that she related to as disqualifying the positive, and mental filtering. She reports having challenges with receiving constructive criticism, however she reports utilizing it to help benefit her. She reports she has not had a panic attack in over a month. The patient states interest with challenging her thoughts to help her  maintain her mood stability. The patient states her take away from today's session as "Self care and just checking the thoughts". The patient declined interest with continuing to receive therapy. She states plans to cancel her next scheduled therapy appointment, however she reports plans to continue receiving medication management with [facility]. She denied SI/HI, AH/VH.   The counselor conducted check in. Counselor discussed mental health symptoms and requested for the patient to rate the symptoms. Counselor reviewed the patients psychotherapy treatment plan goals. Counselor discussed the patients progress with her treatment goals. Counselor reviewed common thinking errors and cognitive distortions. Counselor discussed coping strategies to challenge irrational thoughts and automatic negative thoughts. Counselor discussed monitoring internal dialogue. Counselor discussed relaxation technique coping strategies. Counselor provided the patient with psycho educational information. Counselor assessed for SI/HI, AH/VH. Counselor questioned the patients interest with continuing to receive psychotherapy.     Interventions: Cognitive Behavioral Therapy  Diagnosis:   ICD-10-CM   1. Generalized anxiety disorder  F41.1     2. Major depressive disorder, recurrent episode, moderate (HCC)  F33.1       Plan:   The patient reports "Not to be a perfectionist, to be content with myself".    1) Long Term Goal: Develop strategies to reduce symptoms.     Short Term Goal: Reduce anxiety and improve coping skills.     Objective: Manage panic attacks.      Objective: Learn two new ways of coping with routine stressors.  Objective: Report feeling more positive about self and abilities during therapy sessions.   2) Long Term Goal: Alleviate depressive symptoms and return to previous level of effective functioning.      Short Term Goal: Improve overall mood.      Objective: Get through a  day/week without a crying spell.       Objective: Develop strategies for thought distraction when ruminating on the past.     Objective: Report feelling happy/better overall mood.        Jannifer Hick, [facility]

## 2022-07-09 ENCOUNTER — Encounter: Payer: Self-pay | Admitting: Behavioral Health

## 2022-07-13 ENCOUNTER — Other Ambulatory Visit: Payer: Self-pay | Admitting: Physician Assistant

## 2022-07-18 ENCOUNTER — Other Ambulatory Visit: Payer: Self-pay | Admitting: Physician Assistant

## 2022-07-20 ENCOUNTER — Ambulatory Visit: Payer: Medicare Other | Admitting: Psychiatry

## 2022-07-24 ENCOUNTER — Ambulatory Visit (INDEPENDENT_AMBULATORY_CARE_PROVIDER_SITE_OTHER): Payer: Medicare Other | Admitting: Physician Assistant

## 2022-07-24 ENCOUNTER — Encounter: Payer: Self-pay | Admitting: Physician Assistant

## 2022-07-24 DIAGNOSIS — F411 Generalized anxiety disorder: Secondary | ICD-10-CM | POA: Diagnosis not present

## 2022-07-24 DIAGNOSIS — G4700 Insomnia, unspecified: Secondary | ICD-10-CM

## 2022-07-24 DIAGNOSIS — F3341 Major depressive disorder, recurrent, in partial remission: Secondary | ICD-10-CM

## 2022-07-24 MED ORDER — DULOXETINE HCL 60 MG PO CPEP: 60.0000 mg | ORAL_CAPSULE | Freq: Every day | ORAL | 1 refills | Status: DC

## 2022-07-24 NOTE — Progress Notes (Signed)
[facility]  Patient ID: Katelyn Mcclain,  MRN: [id number]  PCP: Georgianne Fick, MD  Date of Evaluation: 07/24/2022 Time spent:20 minutes  Chief Complaint:  Chief Complaint   Anxiety; Depression; Follow-up    HISTORY/CURRENT STATUS: HPI For routine med check  Is doing really well now. We weaned off Trintellix and changed to Cymbalta. Feels a lot better. Patient is able to enjoy things.  Energy and motivation are better.  No extreme sadness, tearfulness, or feelings of hopelessness.  Sleeps well most of the time. ADLs and personal hygiene are normal.   Denies any changes in concentration, making decisions, or remembering things.  Appetite has not changed.  Weight is stable. Not anxious. Denies suicidal or homicidal thoughts.  Patient denies increased energy with decreased need for sleep, increased talkativeness, racing thoughts, impulsivity or risky behaviors, increased spending, increased libido, grandiosity, increased irritability or anger, paranoia, or hallucinations.  Denies dizziness, syncope, seizures, numbness, tingling, tremor, tics, unsteady gait, slurred speech, confusion. Denies muscle or joint pain, stiffness, or dystonia.  Individual Medical History/ Review of Systems: Changes? :No   Past Psychiatric History:    Past medications for mental health diagnoses include: On Trintellix since 2015, Prozac, Lexapro, maybe others   Was in [facility], [place] on pills, back in the 1980s.  She had several deaths in her family within a few years of each other which was extremely hard.  She was forced to abort a child at 5 months gestation.  Her mom died, then her younger sister died at 29 years old.  States these things led to the suicide attempt.  She has not had any other mental health hospitalizations or attempts.  Allergies: Oat grain extract allergy skin test, Wheat, Adhesive [tape], Morphine, Morphine and related, Barley grass, Clonazepam, Desvenlafaxine,  Ezetimibe, Hydrocodone bit-homatrop mbr, Linaclotide, Metronidazole, Morphine sulfate, Oatmeal, Other, Theophylline, and Alprazolam  Current Medications:  Current Outpatient Medications:    b complex vitamins capsule, Take by mouth., Disp: , Rfl:    busPIRone (BUSPAR) 15 MG tablet, TAKE 1 TABLET BY MOUTH TWICE A DAY FOR ANXIETY, Disp: 180 tablet, Rfl: 1   Calcium-Magnesium-Vitamin D (CALCIUM 1200+D3 PO), Take 1,200 mg by mouth daily., Disp: , Rfl:    Coenzyme Q10 100 MG capsule, Take 1 capsule by mouth daily., Disp: , Rfl:    Denosumab (PROLIA [place]), Inject into the skin. Injection every 6 months, Disp: , Rfl:    docusate sodium (COLACE) 100 MG capsule, Take 100 mg by mouth 2 (two) times daily., Disp: , Rfl:    Eszopiclone 3 MG TABS, Take 1.5 mg by mouth at bedtime as needed (sleep). Take immediately before bedtime, Disp: , Rfl:    gabapentin (NEURONTIN) 100 MG capsule, Take 300 mg by mouth at bedtime., Disp: , Rfl:    Meth-Hyo-M Bl-Na Phos-Ph Sal (URO-MP) 118 MG CAPS, TAKE 1 CAPSULE BY MOUTH TWICE A DAY AS NEEDED, Disp: 30 capsule, Rfl: 5   montelukast (SINGULAIR) 10 MG tablet, Take 10 mg by mouth at bedtime. , Disp: , Rfl:    omeprazole (PRILOSEC) 40 MG capsule, Take 40 mg by mouth daily., Disp: , Rfl:    ondansetron (ZOFRAN) 4 MG tablet, TAKE 1 TABLET BY MOUTH EVERY 6 HOURS AS NEEDED FOR NAUSEA - IF PHERERGAN NOT WORKING, Disp: , Rfl: 2   OVER THE COUNTER MEDICATION, Take 500 mg by mouth daily. Vitamin B5, Disp: , Rfl:    potassium gluconate 595 (99 K) MG TABS tablet, Take by mouth., Disp: ,  Rfl:    pravastatin (PRAVACHOL) 10 MG tablet, Take 10 mg by mouth daily., Disp: , Rfl:    Probiotic Product (PROBIOTIC DAILY PO), Take 1 tablet by mouth 2 (two) times daily. , Disp: , Rfl:    promethazine (PHENERGAN) 25 MG tablet, Take 1 tablet (25 mg total) by mouth every 6 (six) hours as needed for nausea or vomiting., Disp: 10 tablet, Rfl: 0   pyridoxine (B-6) 200 MG tablet, Take 200 mg by mouth daily.,  Disp: , Rfl:    rizatriptan (MAXALT-MLT) 10 MG disintegrating tablet, TAKE 1 TABLET AT ONSET OF MIGRAINE AND REPEAT IN 2 HOURS IF NEEDED, Disp: 27 tablet, Rfl: 3   Vibegron (GEMTESA) 75 MG TABS, Take by mouth daily., Disp: , Rfl:    zonisamide (ZONEGRAN) 100 MG capsule, TAKE 1 CAPSULE TWICE A DAY, Disp: 180 capsule, Rfl: 3   albuterol (PROVENTIL HFA;VENTOLIN HFA) 108 (90 BASE) MCG/ACT inhaler, Inhale 1 puff into the lungs every 6 (six) hours as needed for wheezing or shortness of breath (seasonal allergies). Currently doing 2 puffs per day (Patient not taking: Reported on 05/24/2022), Disp: , Rfl:    albuterol (PROVENTIL) (2.5 MG/3ML) 0.083% nebulizer solution, Take 3 mLs (2.5 mg total) by nebulization every 6 (six) hours as needed for wheezing or shortness of breath. (Patient not taking: Reported on 05/24/2022), Disp: 75 mL, Rfl: 0   Clobetasol Propionate (IMPOYZ) 0.025 % CREA, Apply 1 application topically daily. (Patient not taking: Reported on 06/26/2022), Disp: 60 g, Rfl: 0   clonazePAM (KLONOPIN) 0.5 MG tablet, Take 0.5-1 tablets (0.25-0.5 mg total) by mouth 2 (two) times daily as needed for anxiety. (Patient not taking: Reported on 07/24/2022), Disp: 60 tablet, Rfl: 1   doxycycline (VIBRAMYCIN) 100 MG capsule, Take 1 capsule (100 mg total) by mouth 2 (two) times daily., Disp: 14 capsule, Rfl: 0   DULoxetine (CYMBALTA) 60 MG capsule, Take 1 capsule (60 mg total) by mouth daily., Disp: 90 capsule, Rfl: 1   FLUAD 0.5 ML SUSY, TO BE ADMINISTERED BY PHARMACIST FOR IMMUNIZATION (Patient not taking: Reported on 05/24/2022), Disp: , Rfl:    imiquimod (ALDARA) 5 % cream, Apply topically at bedtime. (Patient not taking: Reported on 06/26/2022), Disp: 12 each, Rfl: 0   methylPREDNISolone (MEDROL DOSEPAK) 4 MG TBPK tablet, Take as directed, Disp: 1 each, Rfl: 0   MYRBETRIQ 50 MG TB24 tablet, TAKE 1 TABLET DAILY (Patient not taking: Reported on 05/24/2022), Disp: 90 tablet, Rfl: 3   predniSONE (DELTASONE) 20 MG  tablet, Take 1 tablet (20 mg total) by mouth daily with breakfast. (Patient not taking: Reported on 03/30/2022), Disp: 5 tablet, Rfl: 0   tacrolimus (PROTOPIC) 0.1 % ointment, Apply topically 2 (two) times daily. (Patient not taking: Reported on 05/24/2022), Disp: 100 g, Rfl: 2  Current Facility-Administered Medications:    ipratropium (ATROVENT) nebulizer solution 0.5 mg, 0.5 mg, Nebulization, Once, Dunn, Ryan M, PA-C Medication Side Effects: none  Family Medical/ Social History: Changes? No  MENTAL HEALTH EXAM:  There were no vitals taken for this visit.There is no height or weight on file to calculate BMI.  General Appearance: Casual and Well Groomed  Eye Contact:  Good  Speech:  Clear and Coherent and Normal Rate  Volume:  Normal  Mood:  Euthymic  Affect:  Congruent  Thought Process:  Goal Directed and Descriptions of Associations: Circumstantial  Orientation:  Full (Time, Place, and Person)  Thought Content: Logical   Suicidal Thoughts:  No  Homicidal Thoughts:  No  Memory:  WNL  Judgement:  Good  Insight:  Good  Psychomotor Activity:  Normal  Concentration:  Concentration: Good  Recall:  Good  Fund of Knowledge: Good  Language: Good  Assets:  Desire for Improvement Financial Resources/Insurance Housing Transportation Vocational/Educational  ADL's:  Intact  Cognition: WNL  Prognosis:  Good   DIAGNOSES:    ICD-10-CM   1. Recurrent major depression in partial remission  F33.41     2. Generalized anxiety disorder  F41.1     3. Insomnia, unspecified type  G47.00       Receiving Psychotherapy: No   RECOMMENDATIONS:  PDMP reviewed. Lunesta filled 04/30/2022. Klonopin filled 05/24/2022. Gabapentin filled 06/22/2022.  I provided 20 minutes of face to face time during this encounter, including time spent before and after the visit in records review, medical decision making, counseling pertinent to today's visit, and charting.   I am glad to see her doing so well.  No  changes in treatment needed.  Continue BuSpar 15 mg, 1 p.o. twice daily. Continue Klonopin 0.5 mg, 1/2-1 p.o. twice daily as needed anxiety.  She rarely takes. Continue Cymbalta 60 mg, 1 p.o. daily. Continue Lunesta 3 mg, 1/2 pill p.o. nightly as needed. Continue gabapentin 100 mg, 3 p.o. nightly. Return in 3 months.  Melony Overly, PA-C

## 2022-07-29 DIAGNOSIS — D485 Neoplasm of uncertain behavior of skin: Secondary | ICD-10-CM | POA: Diagnosis not present

## 2022-07-29 DIAGNOSIS — L821 Other seborrheic keratosis: Secondary | ICD-10-CM | POA: Diagnosis not present

## 2022-07-29 DIAGNOSIS — L578 Other skin changes due to chronic exposure to nonionizing radiation: Secondary | ICD-10-CM | POA: Diagnosis not present

## 2022-07-29 DIAGNOSIS — D225 Melanocytic nevi of trunk: Secondary | ICD-10-CM | POA: Diagnosis not present

## 2022-07-29 DIAGNOSIS — L570 Actinic keratosis: Secondary | ICD-10-CM | POA: Diagnosis not present

## 2022-07-29 DIAGNOSIS — D2239 Melanocytic nevi of other parts of face: Secondary | ICD-10-CM | POA: Diagnosis not present

## 2022-08-14 ENCOUNTER — Ambulatory Visit: Payer: Medicare Other | Admitting: Adult Health

## 2022-08-21 ENCOUNTER — Telehealth: Payer: Self-pay | Admitting: Physician Assistant

## 2022-08-21 MED ORDER — BUSPIRONE HCL 15 MG PO TABS: ORAL_TABLET | ORAL | 1 refills | Status: DC

## 2022-08-21 NOTE — Telephone Encounter (Signed)
Updated pharmacy profile and sent needed Rx.

## 2022-08-21 NOTE — Telephone Encounter (Signed)
Next appt is 11/08/22. Katelyn Mcclain called and said that all of her RX's go to:  [facility] - [place], [place] - [address]   Phone: [phone]  Fax: [phone]    No medications go to [facility].

## 2022-08-22 DIAGNOSIS — M1712 Unilateral primary osteoarthritis, left knee: Secondary | ICD-10-CM | POA: Diagnosis not present

## 2022-09-08 DIAGNOSIS — J069 Acute upper respiratory infection, unspecified: Secondary | ICD-10-CM | POA: Diagnosis not present

## 2022-09-08 DIAGNOSIS — J45909 Unspecified asthma, uncomplicated: Secondary | ICD-10-CM | POA: Diagnosis not present

## 2022-09-08 DIAGNOSIS — K900 Celiac disease: Secondary | ICD-10-CM | POA: Diagnosis not present

## 2022-09-08 DIAGNOSIS — G43909 Migraine, unspecified, not intractable, without status migrainosus: Secondary | ICD-10-CM | POA: Diagnosis not present

## 2022-09-08 DIAGNOSIS — Z20822 Contact with and (suspected) exposure to covid-19: Secondary | ICD-10-CM | POA: Diagnosis not present

## 2022-09-08 DIAGNOSIS — R059 Cough, unspecified: Secondary | ICD-10-CM | POA: Diagnosis not present

## 2022-09-08 DIAGNOSIS — E785 Hyperlipidemia, unspecified: Secondary | ICD-10-CM | POA: Diagnosis not present

## 2022-10-04 ENCOUNTER — Ambulatory Visit: Payer: Medicare Other | Admitting: Adult Health

## 2022-11-08 ENCOUNTER — Ambulatory Visit: Payer: Medicare Other | Admitting: Physician Assistant

## 2022-11-28 ENCOUNTER — Other Ambulatory Visit: Payer: Self-pay | Admitting: Adult Health

## 2022-11-28 NOTE — Telephone Encounter (Signed)
Pt called needing to speak to the RN regarding her zonisamide (ZONEGRAN) 100 MG capsule She states that they will fill her Maxalt but not the zonisamide (ZONEGRAN) 100 MG capsule Please advise.

## 2022-12-03 NOTE — Progress Notes (Unsigned)
PATIENT: Katelyn Mcclain DOB: Sep 23, 1950  REASON FOR VISIT: follow up HISTORY FROM: patient  No chief complaint on file.    HISTORY OF PRESENT ILLNESS: Today 12/03/22:  Katelyn Mcclain is a 72 y.o. female with a history of migraine headaches. Returns today for follow-up.     03/30/22: Katelyn Mcclain is a 72 year old female with a history of migraine headaches.  She returns today for follow-up.  She states that she Had visual issues that started in November. She thinks it was due to rhinovirus. Felt like she floating on a boat. Felt "sea sick" but also had a headache. Would use maxalt during this time but would not resolve the headache. Started dramamine and that helped. No longer taking now. No longer having symptoms  Continues Zonegran 100 mg BID. Continues to control migraines.     12/01/21: Katelyn Mcclain is a 72 year old female with a history of migraine headaches and paresthesias in the lower extremities.  She returns today for follow-up.  She remains on Zonegran 100 mg BID for prevention and Maxalt for abortive therapy.  She reports that she is has had an ongoing headache for the last 3 days. Report that headache is a 2/10 on pain scale. Reports that headaches have been manageable and well controlled. Had hip surgery and since then has had different symptoms should as heat intolerance, BP issues, cold all the time. Reports that she still has paresthesias and has plantar facitis and is in PT. Feels tingling in the toes left is worse than right.  Had NCV/EMG in 2019. Currently on Gabapentin.     NCV/ EMG IMPRESSION:   Nerve conduction studies done on both lower extremities were notable for mild sensory latency abnormalities suggestive of an early peripheral neuropathy.  EMG evaluation of the right lower extremity was relatively unremarkable, no evidence of an overlying lumbosacral radiculopathy was seen.  12/01/20: Katelyn Mcclain is a 72 year old female with a history of migraine  headaches and paresthesias in the lower extremities.  She returns today for follow-up.  Overall she feels that her headaches have remained relatively stable.  She continues to use Maxalt as an abortive therapy and Zonegran 100 mg daily for prevention.  Reports that she is not even having 1 headache a month now.  Numbness in the lower extremities have remained relatively stable.  Reports that she seen orthopedics for Planter fasciitis. no changes in her gait or balance.  12/01/19: Katelyn Mcclain is a 72 year old female with a history of migraine headaches and paresthesias in the lower extremities.  She returns today for follow-up.  She states that her headaches have remained relatively stable.  She has approximately 1 headache a month.  Triggers her barometric pressure.  She does state that she travel to the [place] and this made her headaches worse.  She also has a history of Mnire's disease.  She states that Maxalt is beneficial for her headaches.  She continues to have numbness in the lower extremities.  Tried and failed gabapentin.  No changes in her gait or balance.  HISTORY 06/02/19:   Katelyn Mcclain is a 72 year old female with a history of migraine headaches and paresthesias in the lower extremities.  She returns today for follow-up.  She reports that her headaches have increased in the last month or so.  She feels that this is related to the weather.  She states that she is using more Maxalt.  Approximately 2 to 4 tablets a week.  She reduced her Zonegran dose  to 100 mg twice a day.  Ultimately she would like to continue to reduce the dose.  She continues to have numbness in the toes.  Nerve conduction studies did reveal a early neuropathy.  She denies any discomfort.  Denies any changes with her gait or balance.  She tried gabapentin but did not like this medication.  She returns today for evaluation  REVIEW OF SYSTEMS: Out of a complete 14 system review of symptoms, the patient complains only of the  following symptoms, and all other reviewed systems are negative.  See HPI  ALLERGIES: Allergies  Allergen Reactions   Oat Grain Extract Allergy Skin Test Diarrhea and Nausea And Vomiting   Wheat Other (See Comments) and Diarrhea    Celiac disease   Adhesive [Tape] Other (See Comments)    Tear's skin off    Morphine Other (See Comments)   Morphine And Codeine Other (See Comments)    Sensitive to narcotics, worsening migraines.   Barley Grass Other (See Comments)    Celiac disease   Also rye   Clonazepam Other (See Comments)   Desvenlafaxine Nausea Only   Ezetimibe     Other Reaction(s): arthralgias   Hydrocodone Bit-Homatrop Mbr Other (See Comments)   Linaclotide Other (See Comments)    Caused spastic bladder (reaction to Linzess)  Other Reaction(s): bladder spasms   Metronidazole Other (See Comments)    Black tongue and diarrhea   Morphine Sulfate Other (See Comments)   Oatmeal Other (See Comments)    Celiac disease   Other     Pt does not use artificial sweeteners  Now has anaphylactic allergic to allergy shots due to COVID     Theophylline Nausea Only   Alprazolam Other (See Comments) and Anxiety    HOME MEDICATIONS: Outpatient Medications Prior to Visit  Medication Sig Dispense Refill   albuterol (PROVENTIL HFA;VENTOLIN HFA) 108 (90 BASE) MCG/ACT inhaler Inhale 1 puff into the lungs every 6 (six) hours as needed for wheezing or shortness of breath (seasonal allergies). Currently doing 2 puffs per day (Patient not taking: Reported on 05/24/2022)     albuterol (PROVENTIL) (2.5 MG/3ML) 0.083% nebulizer solution Take 3 mLs (2.5 mg total) by nebulization every 6 (six) hours as needed for wheezing or shortness of breath. (Patient not taking: Reported on 05/24/2022) 75 mL 0   b complex vitamins capsule Take by mouth.     busPIRone (BUSPAR) 15 MG tablet TAKE 1 TABLET BY MOUTH TWICE A DAY FOR ANXIETY 180 tablet 1   Calcium-Magnesium-Vitamin D (CALCIUM 1200+D3 PO) Take 1,200 mg  by mouth daily.     Clobetasol Propionate (IMPOYZ) 0.025 % CREA Apply 1 application topically daily. (Patient not taking: Reported on 06/26/2022) 60 g 0   clonazePAM (KLONOPIN) 0.5 MG tablet Take 0.5-1 tablets (0.25-0.5 mg total) by mouth 2 (two) times daily as needed for anxiety. (Patient not taking: Reported on 07/24/2022) 60 tablet 1   Coenzyme Q10 100 MG capsule Take 1 capsule by mouth daily.     Denosumab (PROLIA [place]) Inject into the skin. Injection every 6 months     docusate sodium (COLACE) 100 MG capsule Take 100 mg by mouth 2 (two) times daily.     doxycycline (VIBRAMYCIN) 100 MG capsule Take 1 capsule (100 mg total) by mouth 2 (two) times daily. 14 capsule 0   DULoxetine (CYMBALTA) 60 MG capsule Take 1 capsule (60 mg total) by mouth daily. 90 capsule 1   Eszopiclone 3 MG TABS Take 1.5 mg by mouth  at bedtime as needed (sleep). Take immediately before bedtime     FLUAD 0.5 ML SUSY TO BE ADMINISTERED BY PHARMACIST FOR IMMUNIZATION (Patient not taking: Reported on 05/24/2022)     gabapentin (NEURONTIN) 100 MG capsule Take 300 mg by mouth at bedtime.     imiquimod (ALDARA) 5 % cream Apply topically at bedtime. (Patient not taking: Reported on 06/26/2022) 12 each 0   Meth-Hyo-M Bl-Na Phos-Ph Sal (URO-MP) 118 MG CAPS TAKE 1 CAPSULE BY MOUTH TWICE A DAY AS NEEDED 30 capsule 5   methylPREDNISolone (MEDROL DOSEPAK) 4 MG TBPK tablet Take as directed 1 each 0   montelukast (SINGULAIR) 10 MG tablet Take 10 mg by mouth at bedtime.      MYRBETRIQ 50 MG TB24 tablet TAKE 1 TABLET DAILY (Patient not taking: Reported on 05/24/2022) 90 tablet 3   omeprazole (PRILOSEC) 40 MG capsule Take 40 mg by mouth daily.     ondansetron (ZOFRAN) 4 MG tablet TAKE 1 TABLET BY MOUTH EVERY 6 HOURS AS NEEDED FOR NAUSEA - IF PHERERGAN NOT WORKING  2   OVER THE COUNTER MEDICATION Take 500 mg by mouth daily. Vitamin B5     potassium gluconate 595 (99 K) MG TABS tablet Take by mouth.     pravastatin (PRAVACHOL) 10 MG tablet Take 10  mg by mouth daily.     predniSONE (DELTASONE) 20 MG tablet Take 1 tablet (20 mg total) by mouth daily with breakfast. (Patient not taking: Reported on 03/30/2022) 5 tablet 0   Probiotic Product (PROBIOTIC DAILY PO) Take 1 tablet by mouth 2 (two) times daily.      promethazine (PHENERGAN) 25 MG tablet Take 1 tablet (25 mg total) by mouth every 6 (six) hours as needed for nausea or vomiting. 10 tablet 0   pyridoxine (B-6) 200 MG tablet Take 200 mg by mouth daily.     rizatriptan (MAXALT-MLT) 10 MG disintegrating tablet TAKE 1 TABLET AT ONSET OF MIGRAINE AND REPEAT IN 2 HOURS IF NEEDED 27 tablet 3   tacrolimus (PROTOPIC) 0.1 % ointment Apply topically 2 (two) times daily. (Patient not taking: Reported on 05/24/2022) 100 g 2   Vibegron (GEMTESA) 75 MG TABS Take by mouth daily.     zonisamide (ZONEGRAN) 100 MG capsule TAKE 1 CAPSULE TWICE A DAY 180 capsule 3   Facility-Administered Medications Prior to Visit  Medication Dose Route Frequency Provider Last Rate Last Admin   ipratropium (ATROVENT) nebulizer solution 0.5 mg  0.5 mg Nebulization Once Dunn, Ryan M, PA-C        PAST MEDICAL HISTORY: Past Medical History:  Diagnosis Date   Abscessed tooth    Allergic rhinitis    Asthma    Asthma    inhaler prn   Celiac disease    Depression with anxiety    Dysmenorrhea    Gastroparesis    GERD (gastroesophageal reflux disease)    Hx of low back pain    IBS (irritable bowel syndrome)    Migraine    Migraine without aura, with intractable migraine, so stated, without mention of status migrainosus 05/20/2013   Osteopenia    Rhinovirus 2023    PAST SURGICAL HISTORY: Past Surgical History:  Procedure Laterality Date   ABDOMINAL SURGERY     BREAST BIOPSY Right    x2   BREAST CYST ASPIRATION  1999   COLONOSCOPY     FOOT SURGERY Left    Bunionectomy   MASS EXCISION  08/08/2011   Procedure: MINOR EXCISION OF MASS;  Surgeon: Wyn Forster., MD;  [facility];   Service: Orthopedics;  Laterality: Left;  left ring excision cyst proximal phalangeal joint   NISSEN FUNDOPLICATION     ROTATOR CUFF REPAIR Right 05/2015   TONSILLECTOMY     TOTAL HIP ARTHROPLASTY Right    June 2023    FAMILY HISTORY: Family History  Problem Relation Age of Onset   Hypertension Mother    Scoliosis Mother    Stroke Father    Dementia Father    Atrial fibrillation Father    Cancer Father    Heart disease Sister        Congenital heart disease   Breast cancer Maternal Grandmother    Migraines Neg Hx     SOCIAL HISTORY: Social History   Socioeconomic History   Marital status: Married    Spouse name: Not on file   Number of children: 0   Years of education: college   Highest education level: Associate degree: occupational, Scientist, product/process development, or vocational program  Occupational History    Employer: [employer]    Comment: [employer]  Tobacco Use   Smoking status: Former   Smokeless tobacco: Never   Tobacco comments:    IN college 1 year  Vaping Use   Vaping status: Never Used  Substance and Sexual Activity   Alcohol use: Not Currently    Comment: wine occasionally   Drug use: No   Sexual activity: Yes    Birth control/protection: Post-menopausal  Other Topics Concern   Not on file  Social History Narrative   Patient lives at home with her husband Henreitta Cea).   Patient is retired.   Education AS, in photography   Right handed.       Caffeine: none   [place]-raised a Clinical cytogeneticist, now [employer], is Financial trader none   Social Determinants of Health   Financial Resource Strain: Low Risk  (05/24/2022)   Overall Financial Resource Strain (CARDIA)    Difficulty of Paying Living Expenses: Not hard at all  Food Insecurity: No Food Insecurity (05/24/2022)   Hunger Vital Sign    Worried About Running Out of Food in the Last Year: Never true    Ran Out of Food in the Last Year: Never true  Transportation Needs: No Transportation Needs  (05/24/2022)   PRAPARE - Administrator, Civil Service (Medical): No    Lack of Transportation (Non-Medical): No  Physical Activity: Inactive (05/24/2022)   Exercise Vital Sign    Days of Exercise per Week: 0 days    Minutes of Exercise per Session: 0 min  Stress: Stress Concern Present (05/24/2022)   [employer] of Occupational Health - Occupational Stress Questionnaire    Feeling of Stress : Very much  Social Connections: Socially Integrated (05/24/2022)   Social Connection and Isolation Panel [NHANES]    Frequency of Communication with Friends and Family: More than three times a week    Frequency of Social Gatherings with Friends and Family: More than three times a week    Attends Religious Services: More than 4 times per year    Active Member of [employer] or Organizations: Yes    Attends Engineer, structural: More than 4 times per year    Marital Status: Married  Catering manager Violence: Not on file      PHYSICAL EXAM  There were no vitals filed for this visit.    There is no height or weight on file  to calculate BMI.  Generalized: Well developed, in no acute distress   Neurological examination  Mentation: Alert oriented to time, place, history taking. Follows all commands speech and language fluent Cranial nerve II-XII:  Extraocular movements were full, visual field were full on confrontational test.  Head turning and shoulder shrug  were normal and symmetric. Motor: The motor testing reveals 5 over 5 strength of all 4 extremities. Good symmetric motor tone is noted throughout.  Sensory: Sensory testing is intact to soft touch on all 4 extremities. No evidence of extinction is noted.  Coordination: Cerebellar testing reveals good finger-nose-finger and heel-to-shin bilaterally.  Gait and station: Gait is normal.  Reflexes: Deep tendon reflexes are symmetric and normal bilaterally.   DIAGNOSTIC DATA (LABS, IMAGING, TESTING) - I reviewed patient  records, labs, notes, testing and imaging myself where available.  Lab Results  Component Value Date   WBC 6.9 05/21/2022   HGB 14.2 05/21/2022   HCT 42.5 05/21/2022   MCV 91.2 05/21/2022   PLT 324 05/21/2022      Component Value Date/Time   NA 136 05/21/2022 1322   K 3.4 (L) 05/21/2022 1322   CL 107 05/21/2022 1322   CO2 22 05/21/2022 1322   GLUCOSE 140 (H) 05/21/2022 1322   BUN 17 05/21/2022 1322   CREATININE 1.05 (H) 05/21/2022 1322   CALCIUM 9.1 05/21/2022 1322   PROT 6.5 05/01/2019 1049   ALBUMIN 3.3 (L) 05/01/2019 1049   AST 45 (H) 05/01/2019 1049   ALT 79 (H) 05/01/2019 1049   ALKPHOS 72 05/01/2019 1049   BILITOT 0.5 05/01/2019 1049   GFRNONAA 57 (L) 05/21/2022 1322   GFRAA >60 05/01/2019 1049    Lab Results  Component Value Date   VITAMINB12 597 12/25/2017   Lab Results  Component Value Date   TSH 1.138 10/01/2017      ASSESSMENT AND PLAN 72 y.o. year old female  has a past medical history of Abscessed tooth, Allergic rhinitis, Asthma, Asthma, Celiac disease, Depression with anxiety, Dysmenorrhea, Gastroparesis, GERD (gastroesophageal reflux disease), low back pain, IBS (irritable bowel syndrome), Migraine, Migraine without aura, with intractable migraine, so stated, without mention of status migrainosus (05/20/2013), Osteopenia, and Rhinovirus (2023). here with:  1.  Migraine headaches  Continue Zonegran 100 mg twice a day for preventative therapy Continue Maxalt for abortive therapy Advised if symptoms worsen or she develops new symptoms she should let us know Follow-up in 6 months or sooner if needed    Butch Penny, MSN, NP-C 12/03/2022, 2:53 PM [facility] [address] [place], [place] [address] [phone]

## 2022-12-04 ENCOUNTER — Encounter: Payer: Self-pay | Admitting: Adult Health

## 2022-12-04 ENCOUNTER — Ambulatory Visit: Payer: Medicare Other | Admitting: Adult Health

## 2022-12-04 VITALS — BP 130/88 | HR 72 | Ht 70.5 in | Wt 175.0 lb

## 2022-12-04 DIAGNOSIS — G43009 Migraine without aura, not intractable, without status migrainosus: Secondary | ICD-10-CM | POA: Diagnosis not present

## 2022-12-04 NOTE — Patient Instructions (Signed)
Your Plan:  Continue zonegran  Look over Qulipta and let me know if you want to add this.  Continue Maxalt for abortive therapy     Thank you for coming to see [place] at [facility]. I hope we have been able to provide you high quality care today.  You may receive a patient satisfaction survey over the next few weeks. We would appreciate your feedback and comments so that we may continue to improve ourselves and the health of our patients.

## 2022-12-04 NOTE — Telephone Encounter (Signed)
Rx refilled per office visit note from today, "Continue Maxalt for abortive therapy."

## 2022-12-08 ENCOUNTER — Ambulatory Visit: Payer: Medicare Other | Admitting: Physician Assistant

## 2022-12-08 ENCOUNTER — Encounter: Payer: Self-pay | Admitting: Physician Assistant

## 2022-12-08 DIAGNOSIS — F411 Generalized anxiety disorder: Secondary | ICD-10-CM | POA: Diagnosis not present

## 2022-12-08 DIAGNOSIS — G4700 Insomnia, unspecified: Secondary | ICD-10-CM

## 2022-12-08 DIAGNOSIS — F3341 Major depressive disorder, recurrent, in partial remission: Secondary | ICD-10-CM | POA: Diagnosis not present

## 2022-12-08 MED ORDER — CLONAZEPAM 0.5 MG PO TABS: 0.2500 mg | ORAL_TABLET | Freq: Two times a day (BID) | ORAL | 1 refills | Status: AC | PRN

## 2022-12-08 MED ORDER — DULOXETINE HCL 60 MG PO CPEP: 60.0000 mg | ORAL_CAPSULE | Freq: Every day | ORAL | Status: DC

## 2022-12-08 MED ORDER — DULOXETINE HCL 30 MG PO CPEP: 30.0000 mg | ORAL_CAPSULE | Freq: Every day | ORAL | 1 refills | Status: DC
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
## 2022-12-19 ENCOUNTER — Other Ambulatory Visit: Payer: Self-pay | Admitting: Internal Medicine

## 2022-12-19 ENCOUNTER — Telehealth: Payer: Self-pay | Admitting: Physician Assistant

## 2022-12-19 ENCOUNTER — Other Ambulatory Visit: Payer: Self-pay | Admitting: Physician Assistant

## 2022-12-19 ENCOUNTER — Ambulatory Visit: Payer: Medicare Other | Admitting: Physician Assistant

## 2022-12-19 DIAGNOSIS — Z1231 Encounter for screening mammogram for malignant neoplasm of breast: Secondary | ICD-10-CM

## 2022-12-19 MED ORDER — ESZOPICLONE 3 MG PO TABS: 3.0000 mg | ORAL_TABLET | Freq: Every evening | ORAL | 0 refills | Status: DC | PRN

## 2022-12-19 NOTE — Telephone Encounter (Addendum)
Pt weaned off Trintellix in March. Cymbalta was increased from 60 to 90 mg on 8/30. She is reporting increased depression, frequent crying, and no patience. Told her that it had not been long enough to get benefit from the increased dose. She reports this is since returning from [place]. She said everyone is so nice there and she feels like it is a "step back in time" to return to the [place]. She is taking clonazepam, but is trying to quarter the tablet instead of half the tablet. She is not taking it more, though has needed 1/2 tablet to sleep. Reports last night took half a Lunesta in addition to the clonazepam. She had been to a town meeting and there were some people she couldn't stand to be around. She said she went to her car and cried and was so wound up from that and needed the combo to get to sleep. She reports she is sleeping ok with the medication. She reported an incident yesterday when she was trying to get a wasp off her porch. She didn't want to kill it, just wanted it to leave the porch and she couldn't get it to leave and she cried.   Last filled clonazepam 9/8, Lunesta last filled 04/30/22 for #90.   [facility] [place]

## 2022-12-19 NOTE — Telephone Encounter (Signed)
Katelyn Mcclain started on Buspar a while ago, has stopped Trinilltex. Started clonazepam and she just increased it. Depression is really bad and she is crying a lot. Has no patience, short fuse. Not sure if Trintellix  was helping or not. Please call . [phone] cell is best

## 2022-12-19 NOTE — Telephone Encounter (Signed)
I agree about the Cymbalta, it's only been 1.5 weeks at the higher dose.  A lot of the symptoms of depression are situational, and counseling will help that more than the medications will.  She saw Senia here a few times.  One of the other therapist may be able to see her but it would be a several month wait.  I recommend that she contact her insurance and see who takes it, then she can go online and search for those people's bio's which will help her decide who she would like to see. I sent in a prescription for Lunesta.  Please discuss sleep hygiene with her if you have not already.  Thanks.

## 2022-12-20 NOTE — Telephone Encounter (Signed)
LVM to RC 

## 2022-12-21 NOTE — Telephone Encounter (Signed)
Patient notified

## 2023-01-01 DIAGNOSIS — M810 Age-related osteoporosis without current pathological fracture: Secondary | ICD-10-CM | POA: Diagnosis not present

## 2023-01-02 ENCOUNTER — Telehealth: Payer: Self-pay | Admitting: Physician Assistant

## 2023-01-02 NOTE — Telephone Encounter (Signed)
Recently pt has been having increased BP (132/78 this am),dizziness, anxiety in the am, headaches, shortness of breath and tightness in her chest.  Has taken her BP in the am and pm and it is still high. Not taking the Cymbalta 30mg  , not helping her at all. She wonders if it could be all related to her meds. [phone] hm, or can call cell

## 2023-01-03 NOTE — Telephone Encounter (Signed)
Looks like pt has recently made changes to medication. If stopping Duloxetine on her own she will not feel well at all. Will contact pt for more clarification.

## 2023-01-03 NOTE — Telephone Encounter (Signed)
Rtc to patient and she reports taking the extra 30 mg Duloxetine to equal 90 mg daily but only took for 3 days and reports feeling bad. She went back down to 60 mg daily now. Pt couldn't give a specific symptom just felt bad. Pt reports having trouble sleeping since returning from their 3 month vacation in [place], she thought once they were back in their normal routine she would be doing better. She reports having trouble falling asleep at night and will stay up until 12-1:00 am till she is exhausted and tired then will sleep until her alarm goes off at 8:00 am. She reports when waking up she does not feel rested, she is tired and groggy. She reports taking Gabapentin 300 mg at hs at bedtime but only takes Lunesta if she can't fall asleep by 2:00 am.  The symptoms she reported with dizziness, headache, anxiety, shortness of breathe, tightness in chest is random, doesn't happen daily. She reports her b/p being elevated with reading given of 132/78, which may be higher than her normal.   Informed her that I would let Rosey Bath know her symptoms but I was not sure of any changes until her apt on 01/17/23. Informed her I would follow up after her note has been reviewed.

## 2023-01-04 NOTE — Telephone Encounter (Signed)
Reviewed

## 2023-01-04 NOTE — Telephone Encounter (Signed)
Patient notified of recommendations.

## 2023-01-04 NOTE — Telephone Encounter (Signed)
You're right, no change except to encourage her to take the Lunesta at bedtime, if she's unable to fall asleep within 3 hours of laying down. Agree w/ staying at Cymbalta 60 mg. If CP or tightness is still occurring, needs to see her PCP or Urgent care to r/o heart issues.

## 2023-01-08 DIAGNOSIS — R42 Dizziness and giddiness: Secondary | ICD-10-CM | POA: Diagnosis not present

## 2023-01-08 DIAGNOSIS — N1831 Chronic kidney disease, stage 3a: Secondary | ICD-10-CM | POA: Diagnosis not present

## 2023-01-08 DIAGNOSIS — R030 Elevated blood-pressure reading, without diagnosis of hypertension: Secondary | ICD-10-CM | POA: Diagnosis not present

## 2023-01-08 DIAGNOSIS — R0602 Shortness of breath: Secondary | ICD-10-CM | POA: Diagnosis not present

## 2023-01-12 ENCOUNTER — Ambulatory Visit
Admission: RE | Admit: 2023-01-12 | Discharge: 2023-01-12 | Disposition: A | Discharge status: Home / Self Care | Payer: Medicare Other | Source: Ambulatory Visit | Attending: Internal Medicine | Admitting: Internal Medicine

## 2023-01-12 DIAGNOSIS — Z1231 Encounter for screening mammogram for malignant neoplasm of breast: Secondary | ICD-10-CM

## 2023-01-16 DIAGNOSIS — N1831 Chronic kidney disease, stage 3a: Secondary | ICD-10-CM | POA: Diagnosis not present

## 2023-01-16 DIAGNOSIS — R42 Dizziness and giddiness: Secondary | ICD-10-CM | POA: Diagnosis not present

## 2023-01-16 DIAGNOSIS — R0602 Shortness of breath: Secondary | ICD-10-CM | POA: Diagnosis not present

## 2023-01-17 ENCOUNTER — Ambulatory Visit (INDEPENDENT_AMBULATORY_CARE_PROVIDER_SITE_OTHER): Payer: Medicare Other | Admitting: Physician Assistant

## 2023-01-17 ENCOUNTER — Encounter: Payer: Self-pay | Admitting: Physician Assistant

## 2023-01-17 DIAGNOSIS — G4700 Insomnia, unspecified: Secondary | ICD-10-CM

## 2023-01-17 DIAGNOSIS — F4323 Adjustment disorder with mixed anxiety and depressed mood: Secondary | ICD-10-CM

## 2023-01-17 DIAGNOSIS — R42 Dizziness and giddiness: Secondary | ICD-10-CM | POA: Diagnosis not present

## 2023-01-17 DIAGNOSIS — R0600 Dyspnea, unspecified: Secondary | ICD-10-CM | POA: Diagnosis not present

## 2023-01-17 MED ORDER — DULOXETINE HCL 60 MG PO CPEP: 60.0000 mg | ORAL_CAPSULE | Freq: Every day | ORAL | 1 refills | Status: DC

## 2023-01-17 MED ORDER — BUSPIRONE HCL 15 MG PO TABS: ORAL_TABLET | ORAL | 1 refills | Status: DC
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
## 2023-02-03 ENCOUNTER — Other Ambulatory Visit: Payer: Self-pay | Admitting: Adult Health

## 2023-02-05 ENCOUNTER — Encounter: Payer: Self-pay | Admitting: Adult Health

## 2023-02-07 ENCOUNTER — Telehealth: Payer: Self-pay | Admitting: Adult Health

## 2023-02-07 NOTE — Telephone Encounter (Signed)
I have no openings today. Tomorrow only at 3:00. She should not be taking Maxalt daily as this may cause a rebound headache.  We could go ahead and start her on Qulipta until I can see her.  We can look at next week to see what I have available.

## 2023-02-07 NOTE — Telephone Encounter (Signed)
I called pt back.  She said that since beginning of Oct she has been progressively getting worse relating to her migraine headaches/ dizziness.  She has been taking rizatriptan 2 tablet daily since last Friday.  She is aware of rebound with taking this, she then from this Monday and Tuesday  taken Tylenol migraines 1000mg  daily, which has not helped.  She has nausea/some vomting and has phenergan prn.  She relayed that has dizziness (she was told that was from migraines and not menieres- she has no hearing loss.  Pain is Bilateral temple, radiates to all over her head.  Level 9-10, she goes to bed.  Not sure what triggers (but said had similar instance last time about this time).  She is ok to try and start quilipta.  I told her that will not be acute, and most likely will need prior Berkley Harvey (she said her insurance was good).  She had infusion here before but got infection (blood) so she is not wanting to go that route.  ? Steroids?  She could not come tomorrow afternoon, but I told her could keep look out for appt next week or in am if opening came available.  It could be tomorrow that I get back to her since it is now later.  Pt said that was ok.  I did mention as well [facility] has [facility] for issues like this as well.

## 2023-02-07 NOTE — Telephone Encounter (Signed)
Katelyn Mcclain spoke with pt on phone.

## 2023-02-07 NOTE — Telephone Encounter (Signed)
Pt asking for a call back to discuss how to get rid of the headache. Have been communicating on MyChart with the nurse.

## 2023-02-08 DIAGNOSIS — G43909 Migraine, unspecified, not intractable, without status migrainosus: Secondary | ICD-10-CM | POA: Diagnosis not present

## 2023-02-08 MED ORDER — QULIPTA 30 MG PO TABS: 30.0000 mg | ORAL_TABLET | Freq: Every day | ORAL | 11 refills | Status: DC

## 2023-02-08 MED ORDER — METHYLPREDNISOLONE 4 MG PO TBPK: ORAL_TABLET | ORAL | 0 refills | Status: DC

## 2023-02-08 NOTE — Telephone Encounter (Signed)
Spoke with pt and she is willing to try the medrol dose pack.  She does not have diabetes.  I gave her common side effect (increased energy, appetite, insomnia).  She was doing ok until she went out for appt.  So trying this she is willing.  She is aware if worsens to see care at [facility] or ED.  [facility] get at [facility] for both steroids and qulipta.

## 2023-02-08 NOTE — Telephone Encounter (Signed)
I sent order in for Qulipta 30 mg daily.  Has she ever done a steroid Dosepak and was it beneficial?  Per the chart it does not look like she is diabetic?  Please make sure she does not have any active infection?  We can send in Medrol Dosepak.  I would also encourage patient that if his headache is different or she is not getting relief she should go to her nearest urgent care or ED for evaluation.

## 2023-02-08 NOTE — Addendum Note (Signed)
Addended by: Enedina Finner on: 02/08/2023 05:02 PM   Modules accepted: Orders

## 2023-02-08 NOTE — Addendum Note (Signed)
Addended by: Guy Begin on: 02/08/2023 04:43 PM   Modules accepted: Orders

## 2023-02-12 NOTE — Telephone Encounter (Signed)
I called pt and she said that she is better , stopped rizatriptan, took  hydrocodone thurs or Frid night (old tabs she had on hand), then steroids and phenergan.  She is doing better.  She thanked [place] for calling.

## 2023-02-12 NOTE — Telephone Encounter (Signed)
Can you call and check on patient. See if medrol dose pack is helping with headache

## 2023-02-13 ENCOUNTER — Encounter: Payer: Self-pay | Admitting: Adult Health

## 2023-02-14 ENCOUNTER — Encounter: Payer: Self-pay | Admitting: Adult Health

## 2023-02-15 DIAGNOSIS — Z124 Encounter for screening for malignant neoplasm of cervix: Secondary | ICD-10-CM | POA: Diagnosis not present

## 2023-02-15 MED ORDER — QULIPTA 30 MG PO TABS: 30.0000 mg | ORAL_TABLET | Freq: Every day | ORAL | 11 refills | Status: DC

## 2023-02-19 ENCOUNTER — Encounter: Payer: Self-pay | Admitting: Adult Health

## 2023-02-20 ENCOUNTER — Telehealth: Payer: Self-pay | Admitting: *Deleted

## 2023-02-20 NOTE — Telephone Encounter (Signed)
Patient wanting an MRI of the brain or an EEG?  Can we get her in for a sooner visit not sure if I or the physician have anything?

## 2023-02-20 NOTE — Telephone Encounter (Signed)
I called [employer] , [place] rep.  Case # [id number].  She will fax over the PA auth form for the pt for [place] to complete as pt has started taking the qulipta , not for very long, so could not answer if it has helped her migraines as yet.  She will fax form.

## 2023-02-21 DIAGNOSIS — H43811 Vitreous degeneration, right eye: Secondary | ICD-10-CM | POA: Diagnosis not present

## 2023-02-21 DIAGNOSIS — H0102A Squamous blepharitis right eye, upper and lower eyelids: Secondary | ICD-10-CM | POA: Diagnosis not present

## 2023-02-21 DIAGNOSIS — G43909 Migraine, unspecified, not intractable, without status migrainosus: Secondary | ICD-10-CM | POA: Diagnosis not present

## 2023-02-21 DIAGNOSIS — H16223 Keratoconjunctivitis sicca, not specified as Sjogren's, bilateral: Secondary | ICD-10-CM | POA: Diagnosis not present

## 2023-02-21 DIAGNOSIS — H2513 Age-related nuclear cataract, bilateral: Secondary | ICD-10-CM | POA: Diagnosis not present

## 2023-02-21 DIAGNOSIS — H0102B Squamous blepharitis left eye, upper and lower eyelids: Secondary | ICD-10-CM | POA: Diagnosis not present

## 2023-02-21 NOTE — Telephone Encounter (Signed)
Form received. Completing.  Since pt on quilipta since Sunday. Asking if she has noted any change or improvement in her headaches.

## 2023-02-21 NOTE — Telephone Encounter (Signed)
See other telephone/ email.

## 2023-02-21 NOTE — Telephone Encounter (Signed)
What about 11/21 at 3:30

## 2023-02-22 NOTE — Telephone Encounter (Signed)
Pt accepted and appt made.  PA approved for Quilipta 01-23-2023 thru 02-22-2024. 30mg  once daily.  Fax confirmation received from [facility] with approval.

## 2023-03-01 ENCOUNTER — Ambulatory Visit: Payer: Medicare Other | Admitting: Adult Health

## 2023-03-15 DIAGNOSIS — L299 Pruritus, unspecified: Secondary | ICD-10-CM | POA: Diagnosis not present

## 2023-03-15 DIAGNOSIS — L301 Dyshidrosis [pompholyx]: Secondary | ICD-10-CM | POA: Diagnosis not present

## 2023-03-20 ENCOUNTER — Encounter: Payer: Self-pay | Admitting: Physician Assistant

## 2023-03-20 ENCOUNTER — Ambulatory Visit: Payer: Medicare Other | Admitting: Physician Assistant

## 2023-03-20 DIAGNOSIS — G4700 Insomnia, unspecified: Secondary | ICD-10-CM | POA: Diagnosis not present

## 2023-03-20 DIAGNOSIS — F4323 Adjustment disorder with mixed anxiety and depressed mood: Secondary | ICD-10-CM | POA: Diagnosis not present
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
## 2023-03-21 DIAGNOSIS — K900 Celiac disease: Secondary | ICD-10-CM | POA: Diagnosis not present

## 2023-03-22 ENCOUNTER — Other Ambulatory Visit (HOSPITAL_COMMUNITY): Payer: Self-pay

## 2023-03-26 DIAGNOSIS — E782 Mixed hyperlipidemia: Secondary | ICD-10-CM | POA: Diagnosis not present

## 2023-03-26 DIAGNOSIS — N1831 Chronic kidney disease, stage 3a: Secondary | ICD-10-CM | POA: Diagnosis not present

## 2023-04-11 DIAGNOSIS — Z20822 Contact with and (suspected) exposure to covid-19: Secondary | ICD-10-CM | POA: Diagnosis not present

## 2023-04-11 DIAGNOSIS — E785 Hyperlipidemia, unspecified: Secondary | ICD-10-CM | POA: Diagnosis not present

## 2023-04-11 DIAGNOSIS — J42 Unspecified chronic bronchitis: Secondary | ICD-10-CM | POA: Diagnosis not present

## 2023-04-11 DIAGNOSIS — K900 Celiac disease: Secondary | ICD-10-CM | POA: Diagnosis not present

## 2023-04-11 DIAGNOSIS — B370 Candidal stomatitis: Secondary | ICD-10-CM | POA: Diagnosis not present

## 2023-04-11 DIAGNOSIS — J45909 Unspecified asthma, uncomplicated: Secondary | ICD-10-CM | POA: Diagnosis not present

## 2023-04-11 DIAGNOSIS — G43909 Migraine, unspecified, not intractable, without status migrainosus: Secondary | ICD-10-CM | POA: Diagnosis not present

## 2023-04-19 ENCOUNTER — Telehealth: Payer: Self-pay | Admitting: Adult Health

## 2023-04-19 MED ORDER — QULIPTA 30 MG PO TABS: 30.0000 mg | ORAL_TABLET | Freq: Every day | ORAL | 3 refills | Status: DC

## 2023-04-19 NOTE — Telephone Encounter (Signed)
 Qulipta Rx changed to 90 day supply at [facility].

## 2023-04-19 NOTE — Telephone Encounter (Signed)
 Pt states her insurance company has asked that she request a 3 month Rx for her  Atogepant (QULIPTA) 30 MG TABS

## 2023-04-26 DIAGNOSIS — R052 Subacute cough: Secondary | ICD-10-CM | POA: Diagnosis not present

## 2023-04-26 DIAGNOSIS — B370 Candidal stomatitis: Secondary | ICD-10-CM | POA: Diagnosis not present

## 2023-05-03 DIAGNOSIS — L301 Dyshidrosis [pompholyx]: Secondary | ICD-10-CM | POA: Diagnosis not present

## 2023-05-04 ENCOUNTER — Ambulatory Visit: Payer: Medicare Other | Admitting: Physician Assistant

## 2023-05-04 DIAGNOSIS — B9689 Other specified bacterial agents as the cause of diseases classified elsewhere: Secondary | ICD-10-CM | POA: Diagnosis not present

## 2023-05-04 DIAGNOSIS — J028 Acute pharyngitis due to other specified organisms: Secondary | ICD-10-CM | POA: Diagnosis not present

## 2023-05-30 DIAGNOSIS — H0102B Squamous blepharitis left eye, upper and lower eyelids: Secondary | ICD-10-CM | POA: Diagnosis not present

## 2023-05-30 DIAGNOSIS — H0102A Squamous blepharitis right eye, upper and lower eyelids: Secondary | ICD-10-CM | POA: Diagnosis not present

## 2023-05-30 DIAGNOSIS — H2513 Age-related nuclear cataract, bilateral: Secondary | ICD-10-CM | POA: Diagnosis not present

## 2023-05-30 DIAGNOSIS — H43811 Vitreous degeneration, right eye: Secondary | ICD-10-CM | POA: Diagnosis not present

## 2023-05-30 DIAGNOSIS — G43909 Migraine, unspecified, not intractable, without status migrainosus: Secondary | ICD-10-CM | POA: Diagnosis not present

## 2023-06-05 DIAGNOSIS — B351 Tinea unguium: Secondary | ICD-10-CM | POA: Diagnosis not present

## 2023-06-05 DIAGNOSIS — L601 Onycholysis: Secondary | ICD-10-CM | POA: Diagnosis not present

## 2023-06-22 ENCOUNTER — Encounter: Payer: Self-pay | Admitting: Physician Assistant

## 2023-06-22 ENCOUNTER — Ambulatory Visit: Payer: Medicare Other | Admitting: Physician Assistant

## 2023-06-22 DIAGNOSIS — G4700 Insomnia, unspecified: Secondary | ICD-10-CM

## 2023-06-22 DIAGNOSIS — F3341 Major depressive disorder, recurrent, in partial remission: Secondary | ICD-10-CM

## 2023-06-22 DIAGNOSIS — F411 Generalized anxiety disorder: Secondary | ICD-10-CM
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
## 2023-06-27 DIAGNOSIS — R5383 Other fatigue: Secondary | ICD-10-CM | POA: Diagnosis not present

## 2023-06-27 DIAGNOSIS — E782 Mixed hyperlipidemia: Secondary | ICD-10-CM | POA: Diagnosis not present

## 2023-06-27 DIAGNOSIS — F411 Generalized anxiety disorder: Secondary | ICD-10-CM | POA: Diagnosis not present

## 2023-06-27 DIAGNOSIS — J45909 Unspecified asthma, uncomplicated: Secondary | ICD-10-CM | POA: Diagnosis not present

## 2023-06-27 DIAGNOSIS — M810 Age-related osteoporosis without current pathological fracture: Secondary | ICD-10-CM | POA: Diagnosis not present

## 2023-06-27 DIAGNOSIS — N3010 Interstitial cystitis (chronic) without hematuria: Secondary | ICD-10-CM | POA: Diagnosis not present

## 2023-06-27 DIAGNOSIS — N1831 Chronic kidney disease, stage 3a: Secondary | ICD-10-CM | POA: Diagnosis not present

## 2023-06-27 DIAGNOSIS — K900 Celiac disease: Secondary | ICD-10-CM | POA: Diagnosis not present

## 2023-06-27 DIAGNOSIS — J309 Allergic rhinitis, unspecified: Secondary | ICD-10-CM | POA: Diagnosis not present

## 2023-07-04 DIAGNOSIS — M810 Age-related osteoporosis without current pathological fracture: Secondary | ICD-10-CM | POA: Diagnosis not present

## 2023-07-11 DIAGNOSIS — N3010 Interstitial cystitis (chronic) without hematuria: Secondary | ICD-10-CM | POA: Diagnosis not present

## 2023-07-11 DIAGNOSIS — M159 Polyosteoarthritis, unspecified: Secondary | ICD-10-CM | POA: Diagnosis not present

## 2023-07-11 DIAGNOSIS — M810 Age-related osteoporosis without current pathological fracture: Secondary | ICD-10-CM | POA: Diagnosis not present

## 2023-07-11 DIAGNOSIS — Z0000 Encounter for general adult medical examination without abnormal findings: Secondary | ICD-10-CM | POA: Diagnosis not present

## 2023-07-11 DIAGNOSIS — F411 Generalized anxiety disorder: Secondary | ICD-10-CM | POA: Diagnosis not present

## 2023-07-11 DIAGNOSIS — J4540 Moderate persistent asthma, uncomplicated: Secondary | ICD-10-CM | POA: Diagnosis not present

## 2023-07-11 DIAGNOSIS — E782 Mixed hyperlipidemia: Secondary | ICD-10-CM | POA: Diagnosis not present

## 2023-07-11 DIAGNOSIS — J309 Allergic rhinitis, unspecified: Secondary | ICD-10-CM | POA: Diagnosis not present

## 2023-07-11 DIAGNOSIS — K900 Celiac disease: Secondary | ICD-10-CM | POA: Diagnosis not present

## 2023-07-11 DIAGNOSIS — N1831 Chronic kidney disease, stage 3a: Secondary | ICD-10-CM | POA: Diagnosis not present

## 2023-07-16 ENCOUNTER — Ambulatory Visit (INDEPENDENT_AMBULATORY_CARE_PROVIDER_SITE_OTHER): Payer: Medicare Other | Admitting: Adult Health

## 2023-07-16 ENCOUNTER — Encounter: Payer: Self-pay | Admitting: Adult Health

## 2023-07-16 VITALS — BP 109/71 | HR 87 | Ht 70.0 in | Wt 174.0 lb

## 2023-07-16 DIAGNOSIS — G43009 Migraine without aura, not intractable, without status migrainosus: Secondary | ICD-10-CM | POA: Diagnosis not present

## 2023-07-16 NOTE — Patient Instructions (Signed)
 Continue Zonegran 100 mg twice a day for preventative therapy Continue Maxalt for abortive therapy Continue Qulipta 60 mg daily

## 2023-07-16 NOTE — Progress Notes (Signed)
 PATIENT: Katelyn Mcclain DOB: 07-21-1950  REASON FOR VISIT: follow up HISTORY FROM: patient  Chief Complaint  Patient presents with   Rm 19    Patient is here alone for 6 month follow-up for migraines. She reports the migraines have been all over the place. They were better during the winter. She had bacterial pna and bronchitis in January and she was coughing so hard and so much. She thinks if it wasn't for the Qulipta she would have had a lot of migraines that month. She has GI problems right now and can't eat. She thinks she gets a headache when she gets hungry. Also has allergy issues so has difficulty deciphering allergy HA and migraine.     HISTORY OF PRESENT ILLNESS: Today 07/16/23:  Katelyn Mcclain is a 73 y.o. female with a history of Migraine headaches. Returns today for follow-up.  She does feel that Bennie Pierini is helping.  She states in January she was sick and was coughing a lot.  She states typically this would trigger her to have more headaches however she noticed that her headaches were controlled.  She reports that she is having some GI issues.  Feels that she has a bacterial infection.  Reports no appetite.  States that if she does eat she feels that her insides goes "spastic.".  She will be reaching out to her GI office. Reports that she has had h.pylori in the past.   Migraines- 1/ week.  Abortive: takes Tylenol sinus then will take maxalt if  headache does not improve. Goes to bed with ice pack Aura: no aura  Location: across the forehead and around the right ear.  Photophobia: yes Phonophobia : yes Nausea/vommitting: reports nausea  No recent cardiac events.     8/26/24[name] is a 73 y.o. female with a history of migraine headaches. Returns today for follow-up. Continues on Zonegran 100 mg BID. Uses maxalt for abortive therapy. Reports that she has approximately 2 headaches a week. Open to trying another medication. Maxalt works well for her. She  returns today for follow-up.   03/30/22: Katelyn Mcclain is a 73 year old female with a history of migraine headaches.  She returns today for follow-up.  She states that she Had visual issues that started in November. She thinks it was due to rhinovirus. Felt like she floating on a boat. Felt "sea sick" but also had a headache. Would use maxalt during this time but would not resolve the headache. Started dramamine and that helped. No longer taking now. No longer having symptoms  Continues Zonegran 100 mg BID. Continues to control migraines.   12/01/21: Katelyn Mcclain is a 73 year old female with a history of migraine headaches and paresthesias in the lower extremities.  She returns today for follow-up.  She remains on Zonegran 100 mg BID for prevention and Maxalt for abortive therapy.  She reports that she is has had an ongoing headache for the last 3 days. Report that headache is a 2/10 on pain scale. Reports that headaches have been manageable and well controlled. Had hip surgery and since then has had different symptoms should as heat intolerance, BP issues, cold all the time. Reports that she still has paresthesias and has plantar facitis and is in PT. Feels tingling in the toes left is worse than right.  Had NCV/EMG in 2019. Currently on Gabapentin.     NCV/ EMG IMPRESSION:   Nerve conduction studies done on both lower extremities were notable for mild sensory latency  abnormalities suggestive of an early peripheral neuropathy.  EMG evaluation of the right lower extremity was relatively unremarkable, no evidence of an overlying lumbosacral radiculopathy was seen.  12/01/20: Katelyn Mcclain is a 73 year old female with a history of migraine headaches and paresthesias in the lower extremities.  She returns today for follow-up.  Overall she feels that her headaches have remained relatively stable.  She continues to use Maxalt as an abortive therapy and Zonegran 100 mg daily for prevention.  Reports that she is not  even having 1 headache a month now.  Numbness in the lower extremities have remained relatively stable.  Reports that she seen orthopedics for Planter fasciitis. no changes in her gait or balance.  12/01/19: Katelyn Mcclain is a 73 year old female with a history of migraine headaches and paresthesias in the lower extremities.  She returns today for follow-up.  She states that her headaches have remained relatively stable.  She has approximately 1 headache a month.  Triggers her barometric pressure.  She does state that she travel to the mountains and this made her headaches worse.  She also has a history of Mnire's disease.  She states that Maxalt is beneficial for her headaches.  She continues to have numbness in the lower extremities.  Tried and failed gabapentin.  No changes in her gait or balance.  HISTORY 06/02/19:   Katelyn Mcclain is a 73 year old female with a history of migraine headaches and paresthesias in the lower extremities.  She returns today for follow-up.  She reports that her headaches have increased in the last month or so.  She feels that this is related to the weather.  She states that she is using more Maxalt.  Approximately 2 to 4 tablets a week.  She reduced her Zonegran dose to 100 mg twice a day.  Ultimately she would like to continue to reduce the dose.  She continues to have numbness in the toes.  Nerve conduction studies did reveal a early neuropathy.  She denies any discomfort.  Denies any changes with her gait or balance.  She tried gabapentin but did not like this medication.  She returns today for evaluation  REVIEW OF SYSTEMS: Out of a complete 14 system review of symptoms, the patient complains only of the following symptoms, and all other reviewed systems are negative.  See HPI  ALLERGIES: Allergies  Allergen Reactions   Oat Grain Extract Allergy Skin Test Diarrhea and Nausea And Vomiting   Wheat Other (See Comments) and Diarrhea    Celiac disease   Adhesive [Tape]  Other (See Comments)    Tear's skin off    Morphine Other (See Comments)   Morphine And Codeine Other (See Comments)    Sensitive to narcotics, worsening migraines.   Barley Grass Other (See Comments)    Celiac disease   Also rye   Desvenlafaxine Nausea Only   Ezetimibe     Other Reaction(s): arthralgias   Hydrocodone Bit-Homatrop Mbr Other (See Comments)   Linaclotide Other (See Comments)    Caused spastic bladder (reaction to Linzess)  Other Reaction(s): bladder spasms   Metronidazole Other (See Comments)    Black tongue and diarrhea   Morphine Sulfate Other (See Comments)   Oatmeal Other (See Comments)    Celiac disease   Other     Pt does not use artificial sweeteners  Now has anaphylactic allergic to allergy shots due to COVID     Theophylline Nausea Only   Alprazolam Other (See Comments) and Anxiety  HOME MEDICATIONS: Outpatient Medications Prior to Visit  Medication Sig Dispense Refill   albuterol (PROVENTIL HFA;VENTOLIN HFA) 108 (90 BASE) MCG/ACT inhaler Inhale 1 puff into the lungs every 6 (six) hours as needed for wheezing or shortness of breath (seasonal allergies). Currently doing 2 puffs per day     albuterol (PROVENTIL) (2.5 MG/3ML) 0.083% nebulizer solution Take 3 mLs (2.5 mg total) by nebulization every 6 (six) hours as needed for wheezing or shortness of breath. 75 mL 0   Atogepant (QULIPTA) 30 MG TABS Take 1 tablet (30 mg total) by mouth daily. 90 tablet 3   b complex vitamins capsule Take by mouth.     busPIRone (BUSPAR) 15 MG tablet TAKE 1 TABLET BY MOUTH TWICE A DAY FOR ANXIETY (Patient taking differently: Take 15 mg by mouth 2 (two) times daily.) 180 tablet 1   Calcium-Magnesium-Vitamin D (CALCIUM 1200+D3 PO) Take 1,200 mg by mouth daily.     clonazePAM (KLONOPIN) 0.5 MG tablet Take 0.5-1 tablets (0.25-0.5 mg total) by mouth 2 (two) times daily as needed for anxiety. 60 tablet 1   Coenzyme Q10 100 MG capsule Take 1 capsule by mouth daily.     Denosumab  (PROLIA [place]) Inject into the skin. Injection every 6 months     docusate sodium (COLACE) 100 MG capsule Take 100 mg by mouth 2 (two) times daily.     DULoxetine (CYMBALTA) 60 MG capsule Take 1 capsule (60 mg total) by mouth daily. 180 capsule 1   Eszopiclone 3 MG TABS Take 1 tablet (3 mg total) by mouth at bedtime as needed (sleep). 30 tablet 0   Ferrous Sulfate (IRON PO) Take 65 mg by mouth daily at 6 (six) AM.     gabapentin (NEURONTIN) 100 MG capsule Take 300 mg by mouth at bedtime.     Meth-Hyo-M Bl-Na Phos-Ph Sal (URO-MP) 118 MG CAPS TAKE 1 CAPSULE BY MOUTH TWICE A DAY AS NEEDED 30 capsule 5   montelukast (SINGULAIR) 10 MG tablet Take 10 mg by mouth at bedtime.      omeprazole (PRILOSEC) 40 MG capsule Take 40 mg by mouth 2 (two) times daily.     ondansetron (ZOFRAN) 4 MG tablet TAKE 1 TABLET BY MOUTH EVERY 6 HOURS AS NEEDED FOR NAUSEA - IF PHERERGAN NOT WORKING  2   OVER THE COUNTER MEDICATION Take 500 mg by mouth daily. Vitamin B5     pravastatin (PRAVACHOL) 10 MG tablet Take 10 mg by mouth daily.     Probiotic Product (PROBIOTIC DAILY PO) Take 1 tablet by mouth 2 (two) times daily.      promethazine (PHENERGAN) 25 MG tablet Take 1 tablet (25 mg total) by mouth every 6 (six) hours as needed for nausea or vomiting. 10 tablet 0   pyridoxine (B-6) 200 MG tablet Take 200 mg by mouth daily.     rizatriptan (MAXALT-MLT) 10 MG disintegrating tablet TAKE 1 TABLET AT ONSET OF MIGRAINE AND REPEAT IN 2 HOURS IF NEEDED 27 tablet 3   Vibegron (GEMTESA) 75 MG TABS Take by mouth daily.     Zinc 50 MG TABS Take 50 mg by mouth daily.     zonisamide (ZONEGRAN) 100 MG capsule TAKE 1 CAPSULE TWICE A DAY 180 capsule 1   potassium gluconate 595 (99 K) MG TABS tablet Take by mouth.     FLUAD 0.5 ML SUSY TO BE ADMINISTERED BY PHARMACIST FOR IMMUNIZATION (Patient not taking: Reported on 06/22/2023)     methylPREDNISolone (MEDROL DOSEPAK) 4 MG TBPK  tablet Use as directed. 21 tablet 0   Facility-Administered  Medications Prior to Visit  Medication Dose Route Frequency Provider Last Rate Last Admin   ipratropium (ATROVENT) nebulizer solution 0.5 mg  0.5 mg Nebulization Once Sondra Barges, PA-C        PAST MEDICAL HISTORY: Past Medical History:  Diagnosis Date   Abscessed tooth    Allergic rhinitis    Asthma    Asthma    inhaler prn   Celiac disease    Depression with anxiety    Dysmenorrhea    Gastroparesis    GERD (gastroesophageal reflux disease)    Hx of low back pain    IBS (irritable bowel syndrome)    Migraine    Migraine without aura, with intractable migraine, so stated, without mention of status migrainosus 05/20/2013   Osteopenia    Rhinovirus 2023    PAST SURGICAL HISTORY: Past Surgical History:  Procedure Laterality Date   ABDOMINAL SURGERY     BREAST BIOPSY Right    x2   BREAST CYST ASPIRATION  1999   COLONOSCOPY     FOOT SURGERY Left    Bunionectomy   MASS EXCISION  08/08/2011   Procedure: MINOR EXCISION OF MASS;  Surgeon: Wyn Forster., MD;  Location: [facility];  Service: Orthopedics;  Laterality: Left;  left ring excision cyst proximal phalangeal joint   NISSEN FUNDOPLICATION     ROTATOR CUFF REPAIR Right 05/2015   TONSILLECTOMY     TOTAL HIP ARTHROPLASTY Right    June 2023    FAMILY HISTORY: Family History  Problem Relation Age of Onset   Hypertension Mother    Scoliosis Mother    Stroke Father    Dementia Father    Atrial fibrillation Father    Cancer Father    Heart disease Sister        Congenital heart disease   Breast cancer Maternal Grandmother    Migraines Neg Hx     SOCIAL HISTORY: Social History   Socioeconomic History   Marital status: Married    Spouse name: Not on file   Number of children: 0   Years of education: college   Highest education level: Associate degree: occupational, Scientist, product/process development, or vocational program  Occupational History    Employer: [employer]    Comment: [employer]  Tobacco Use    Smoking status: Former   Smokeless tobacco: Never   Tobacco comments:    In college 1 year  Vaping Use   Vaping status: Never Used  Substance and Sexual Activity   Alcohol use: Not Currently    Comment: wine rarely   Drug use: No   Sexual activity: Yes    Birth control/protection: Post-menopausal  Other Topics Concern   Not on file  Social History Narrative   Patient lives at home with her husband Henreitta Cea).   Patient is retired.   Education AS, in photography   Right handed.       Caffeine: decaf 1 cup every now and then   [place]-raised a Clinical cytogeneticist, now [employer], is Financial trader none   Social Drivers of Health   Financial Resource Strain: Low Risk  (05/24/2022)   Overall Financial Resource Strain (CARDIA)    Difficulty of Paying Living Expenses: Not hard at all  Food Insecurity: No Food Insecurity (05/24/2022)   Hunger Vital Sign    Worried About Running Out of Food in the Last Year: Never true  Ran Out of Food in the Last Year: Never true  Transportation Needs: No Transportation Needs (05/24/2022)   PRAPARE - Administrator, Civil Service (Medical): No    Lack of Transportation (Non-Medical): No  Physical Activity: Inactive (05/24/2022)   Exercise Vital Sign    Days of Exercise per Week: 0 days    Minutes of Exercise per Session: 0 min  Stress: Stress Concern Present (05/24/2022)   [employer] of Occupational Health - Occupational Stress Questionnaire    Feeling of Stress : Very much  Social Connections: Socially Integrated (05/24/2022)   Social Connection and Isolation Panel [NHANES]    Frequency of Communication with Friends and Family: More than three times a week    Frequency of Social Gatherings with Friends and Family: More than three times a week    Attends Religious Services: More than 4 times per year    Active Member of [employer] or Organizations: Yes    Attends Engineer, structural: More than 4 times per year    Marital  Status: Married  Catering manager Violence: Not on file      PHYSICAL EXAM  Vitals:   07/16/23 1328  BP: 109/71  Pulse: 87  Weight: 174 lb (78.9 kg)  Height: 5\' 10"  (1.778 m)    Body mass index is 24.97 kg/m.  Generalized: Well developed, in no acute distress   Neurological examination  Mentation: Alert oriented to time, place, history taking. Follows all commands speech and language fluent Cranial nerve II-XII:  Extraocular movements were full, visual field were full on confrontational test.  Head turning and shoulder shrug  were normal and symmetric. Motor: The motor testing reveals 5 over 5 strength of all 4 extremities. Good symmetric motor tone is noted throughout.  Sensory: Sensory testing is intact to soft touch on all 4 extremities. No evidence of extinction is noted.  Coordination: Cerebellar testing reveals good finger-nose-finger and heel-to-shin bilaterally.  Gait and station: Gait is normal.  Reflexes: Deep tendon reflexes are symmetric and normal bilaterally.   DIAGNOSTIC DATA (LABS, IMAGING, TESTING) - I reviewed patient records, labs, notes, testing and imaging myself where available.  Lab Results  Component Value Date   WBC 6.9 05/21/2022   HGB 14.2 05/21/2022   HCT 42.5 05/21/2022   MCV 91.2 05/21/2022   PLT 324 05/21/2022      Component Value Date/Time   NA 136 05/21/2022 1322   K 3.4 (L) 05/21/2022 1322   CL 107 05/21/2022 1322   CO2 22 05/21/2022 1322   GLUCOSE 140 (H) 05/21/2022 1322   BUN 17 05/21/2022 1322   CREATININE 1.05 (H) 05/21/2022 1322   CALCIUM 9.1 05/21/2022 1322   PROT 6.5 05/01/2019 1049   ALBUMIN 3.3 (L) 05/01/2019 1049   AST 45 (H) 05/01/2019 1049   ALT 79 (H) 05/01/2019 1049   ALKPHOS 72 05/01/2019 1049   BILITOT 0.5 05/01/2019 1049   GFRNONAA 57 (L) 05/21/2022 1322   GFRAA >60 05/01/2019 1049    Lab Results  Component Value Date   VITAMINB12 597 12/25/2017   Lab Results  Component Value Date   TSH 1.138  10/01/2017      ASSESSMENT AND PLAN 73 y.o. year old female  has a past medical history of Abscessed tooth, Allergic rhinitis, Asthma, Asthma, Celiac disease, Depression with anxiety, Dysmenorrhea, Gastroparesis, GERD (gastroesophageal reflux disease), low back pain, IBS (irritable bowel syndrome), Migraine, Migraine without aura, with intractable migraine, so stated, without mention of status migrainosus (  05/20/2013), Osteopenia, and Rhinovirus (2023). here with:  1.  Migraine headaches  Continue Zonegran 100 mg twice a day for preventative therapy Continue Maxalt for abortive therapy Continue Qulipta 60 mg daily  Advised if symptoms worsen or she develops new symptoms she should let us know Follow-up in 6 months or sooner if needed  Previous patient of Dr. Clarisa Kindred.  We will get her established with Dr. Vickey Huger (at her request) as her primary neurologist.  Happy to see her back in the office after she sees Dr. Vickey Huger.  Butch Penny, MSN, NP-C 07/16/2023, 1:36 PM [facility] [address], [address] [place], [place] [address] [phone]

## 2023-07-26 ENCOUNTER — Other Ambulatory Visit: Payer: Self-pay | Admitting: Internal Medicine

## 2023-07-26 DIAGNOSIS — E782 Mixed hyperlipidemia: Secondary | ICD-10-CM

## 2023-08-03 DIAGNOSIS — L720 Epidermal cyst: Secondary | ICD-10-CM | POA: Diagnosis not present

## 2023-08-03 DIAGNOSIS — D485 Neoplasm of uncertain behavior of skin: Secondary | ICD-10-CM | POA: Diagnosis not present

## 2023-08-03 DIAGNOSIS — C44722 Squamous cell carcinoma of skin of right lower limb, including hip: Secondary | ICD-10-CM | POA: Diagnosis not present

## 2023-08-03 DIAGNOSIS — L814 Other melanin hyperpigmentation: Secondary | ICD-10-CM | POA: Diagnosis not present

## 2023-08-03 DIAGNOSIS — L820 Inflamed seborrheic keratosis: Secondary | ICD-10-CM | POA: Diagnosis not present

## 2023-08-03 DIAGNOSIS — L570 Actinic keratosis: Secondary | ICD-10-CM | POA: Diagnosis not present

## 2023-08-03 DIAGNOSIS — L578 Other skin changes due to chronic exposure to nonionizing radiation: Secondary | ICD-10-CM | POA: Diagnosis not present

## 2023-08-03 DIAGNOSIS — D225 Melanocytic nevi of trunk: Secondary | ICD-10-CM | POA: Diagnosis not present

## 2023-08-14 ENCOUNTER — Other Ambulatory Visit: Payer: Self-pay | Admitting: Adult Health

## 2023-08-16 ENCOUNTER — Ambulatory Visit (HOSPITAL_COMMUNITY)
Admission: RE | Admit: 2023-08-16 | Discharge: 2023-08-16 | Disposition: A | Discharge status: Home / Self Care | Payer: Self-pay | Source: Ambulatory Visit | Attending: Internal Medicine | Admitting: Internal Medicine

## 2023-08-16 DIAGNOSIS — E782 Mixed hyperlipidemia: Secondary | ICD-10-CM | POA: Insufficient documentation

## 2023-09-13 DIAGNOSIS — Z886 Allergy status to analgesic agent status: Secondary | ICD-10-CM | POA: Diagnosis not present

## 2023-09-13 DIAGNOSIS — M79662 Pain in left lower leg: Secondary | ICD-10-CM | POA: Diagnosis not present

## 2023-09-18 DIAGNOSIS — M79605 Pain in left leg: Secondary | ICD-10-CM | POA: Diagnosis not present

## 2023-09-18 DIAGNOSIS — I83892 Varicose veins of left lower extremities with other complications: Secondary | ICD-10-CM | POA: Diagnosis not present

## 2023-09-19 DIAGNOSIS — M79662 Pain in left lower leg: Secondary | ICD-10-CM | POA: Diagnosis not present

## 2023-09-19 DIAGNOSIS — N3281 Overactive bladder: Secondary | ICD-10-CM | POA: Diagnosis not present

## 2023-09-19 DIAGNOSIS — N3010 Interstitial cystitis (chronic) without hematuria: Secondary | ICD-10-CM | POA: Diagnosis not present

## 2023-09-19 DIAGNOSIS — I872 Venous insufficiency (chronic) (peripheral): Secondary | ICD-10-CM | POA: Diagnosis not present

## 2023-09-19 DIAGNOSIS — I83893 Varicose veins of bilateral lower extremities with other complications: Secondary | ICD-10-CM | POA: Diagnosis not present

## 2023-09-21 DIAGNOSIS — I872 Venous insufficiency (chronic) (peripheral): Secondary | ICD-10-CM | POA: Diagnosis not present

## 2023-09-28 DIAGNOSIS — I83892 Varicose veins of left lower extremities with other complications: Secondary | ICD-10-CM | POA: Diagnosis not present

## 2023-09-28 DIAGNOSIS — R600 Localized edema: Secondary | ICD-10-CM | POA: Diagnosis not present

## 2023-10-08 ENCOUNTER — Other Ambulatory Visit: Payer: Self-pay | Admitting: Physician Assistant

## 2023-10-08 DIAGNOSIS — M5459 Other low back pain: Secondary | ICD-10-CM | POA: Diagnosis not present

## 2023-10-09 DIAGNOSIS — I83892 Varicose veins of left lower extremities with other complications: Secondary | ICD-10-CM | POA: Diagnosis not present

## 2023-10-16 DIAGNOSIS — M7989 Other specified soft tissue disorders: Secondary | ICD-10-CM | POA: Diagnosis not present

## 2023-10-16 DIAGNOSIS — I83812 Varicose veins of left lower extremities with pain: Secondary | ICD-10-CM | POA: Diagnosis not present

## 2023-10-16 DIAGNOSIS — I83892 Varicose veins of left lower extremities with other complications: Secondary | ICD-10-CM | POA: Diagnosis not present

## 2023-11-06 DIAGNOSIS — I83892 Varicose veins of left lower extremities with other complications: Secondary | ICD-10-CM | POA: Diagnosis not present

## 2023-11-21 DIAGNOSIS — L304 Erythema intertrigo: Secondary | ICD-10-CM | POA: Diagnosis not present

## 2023-11-21 DIAGNOSIS — L821 Other seborrheic keratosis: Secondary | ICD-10-CM | POA: Diagnosis not present

## 2023-11-22 ENCOUNTER — Encounter: Payer: Self-pay | Admitting: Neurology

## 2023-11-22 ENCOUNTER — Ambulatory Visit (INDEPENDENT_AMBULATORY_CARE_PROVIDER_SITE_OTHER): Admitting: Neurology

## 2023-11-22 VITALS — BP 114/74 | HR 70 | Ht 70.5 in | Wt 175.0 lb

## 2023-11-22 DIAGNOSIS — G44019 Episodic cluster headache, not intractable: Secondary | ICD-10-CM

## 2023-11-22 DIAGNOSIS — F5102 Adjustment insomnia: Secondary | ICD-10-CM | POA: Diagnosis not present

## 2023-11-22 DIAGNOSIS — G4701 Insomnia due to medical condition: Secondary | ICD-10-CM | POA: Diagnosis not present

## 2023-11-22 DIAGNOSIS — G8929 Other chronic pain: Secondary | ICD-10-CM | POA: Insufficient documentation

## 2023-11-22 DIAGNOSIS — G43101 Migraine with aura, not intractable, with status migrainosus: Secondary | ICD-10-CM | POA: Diagnosis not present

## 2023-11-22 MED ORDER — MELATONIN 3 MG PO TBDP: 3.0000 mg | ORAL_TABLET | Freq: Every evening | ORAL | Status: AC | PRN

## 2023-11-22 NOTE — Patient Instructions (Addendum)
 ASSESSMENT AND PLAN 73 y.o. 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   2) Migraine :  with aura , currently taking Quilipta as preventive and zonisamide , acute Migraine abortive rizatriptan  ,  known triggers are weather changes and sleep deprivation.  Will refill.   3)  Chronic insomnia , chronic joint pain ( anxiety/ psychological stressors were named at onset).  Discussed insomnia care - sleep hygiene and also  melatonin.   I am happy to maintain migraine medication, I can offer a HST to rule out organic sleep disorders . I encouraged her asking her PCP to get a consult in  rheumatology .   I cannot treat chronic insomnia.   I plan to follow up through our NP within 5 - 6 months. Insomnia Insomnia is a sleep disorder that makes it difficult to fall asleep or stay asleep. Insomnia can cause fatigue, low energy, difficulty concentrating, mood swings, and poor performance at work or school. There are three different ways to classify insomnia: Difficulty falling asleep. Difficulty staying asleep. Waking up too early in the morning. Any type of insomnia can be long-term (chronic) or short-term (acute). Both are common. Short-term insomnia usually lasts for 3 months or less. Chronic insomnia occurs at least three times a week for longer than 3 months. What are the causes? Insomnia may be caused by another condition, situation, or substance, such as: Having certain mental health conditions, such as anxiety and depression. Using caffeine, alcohol, tobacco, or drugs. Having gastrointestinal conditions, such as gastroesophageal reflux disease (GERD). Having certain medical conditions. These include: Asthma. Alzheimer's disease. Stroke. Chronic pain. An overactive thyroid   gland (hyperthyroidism). Other sleep disorders, such as restless legs syndrome and sleep apnea. Menopause. Sometimes, the cause of insomnia may not be known. What increases the risk? Risk factors for insomnia include: Gender. Females are affected more often than males. Age. Insomnia is more common as people get older. Stress and certain medical and mental health conditions. Lack of exercise. Having an irregular work schedule. This may include working night shifts and traveling between different time zones. What are the signs or symptoms? If you have insomnia, the main symptom is having trouble falling asleep or having trouble staying asleep. This may lead to other symptoms, such as: Feeling tired or having low energy. Feeling nervous about going to sleep. Not feeling rested in the morning. Having trouble concentrating. Feeling irritable, anxious, or depressed. How is this diagnosed? This condition may be diagnosed based on: Your symptoms and medical history. Your health care provider may ask about: Your sleep habits. Any medical conditions you have. Your mental health. A physical exam. How is this treated? Treatment for insomnia depends on the cause. Treatment may focus on treating an underlying condition that is causing the insomnia. Treatment may also include: Medicines to help you sleep. Counseling or therapy. Lifestyle adjustments to help you sleep better. Follow these instructions at home: Eating and drinking  Limit or avoid alcohol, caffeinated beverages, and products that contain nicotine  and tobacco, especially close to bedtime. These can disrupt your sleep. Do not eat a large meal or eat spicy foods right before bedtime. This can lead to digestive discomfort that can make it hard for you to sleep. Sleep habits  Keep  a sleep diary to help you and your health care provider figure out what could be causing your insomnia. Write down: When you sleep. When you wake up during  the night. How well you sleep and how rested you feel the next day. Any side effects of medicines you are taking. What you eat and drink. Make your bedroom a dark, comfortable place where it is easy to fall asleep. Put up shades or blackout curtains to block light from outside. Use a white noise machine to block noise. Keep the temperature cool. Limit screen use before bedtime. This includes: Not watching TV. Not using your smartphone, tablet, or computer. Stick to a routine that includes going to bed and waking up at the same times every day and night. This can help you fall asleep faster. Consider making a quiet activity, such as reading, part of your nighttime routine. Try to avoid taking naps during the day so that you sleep better at night. Get out of bed if you are still awake after 15 minutes of trying to sleep. Keep the lights down, but try reading or doing a quiet activity. When you feel sleepy, go back to bed. General instructions Take over-the-counter and prescription medicines only as told by your health care provider. Exercise regularly as told by your health care provider. However, avoid exercising in the hours right before bedtime. Use relaxation techniques to manage stress. Ask your health care provider to suggest some techniques that may work well for you. These may include: Breathing exercises. Routines to release muscle tension. Visualizing peaceful scenes. Make sure that you drive carefully. Do not drive if you feel very sleepy. Keep all follow-up visits. This is important. Contact a health care provider if: You are tired throughout the day. You have trouble in your daily routine due to sleepiness. You continue to have sleep problems, or your sleep problems get worse. Get help right away if: You have thoughts about hurting yourself or someone else. Get help right away if you feel like you may hurt yourself or others, or have thoughts about taking your own life. Go to  your nearest emergency room or: Call 911. Call the National Suicide Prevention Lifeline at 850-872-6063 or 988. This is open 24 hours a day. Text the Crisis Text Line at (432)617-8564. Summary Insomnia is a sleep disorder that makes it difficult to fall asleep or stay asleep. Insomnia can be long-term (chronic) or short-term (acute). Treatment for insomnia depends on the cause. Treatment may focus on treating an underlying condition that is causing the insomnia. Keep a sleep diary to help you and your health care provider figure out what could be causing your insomnia. This information is not intended to replace advice given to you by your health care provider. Make sure you discuss any questions you have with your health care provider. Document Revised: 03/07/2021 Document Reviewed: 03/07/2021 Elsevier Patient Education  2024 Elsevier Inc.    Healthy Living: Sleep In this video, you will learn why sleep is an important part of a healthy lifestyle. To view the content, go to this web address: https://pe.elsevier.[email or web address]  This video will expire on: 03/21/2025. If you need access to this video following this date, please reach out to the healthcare provider who assigned it to you. This information is not intended to replace advice given to you by your health care provider. Make sure you discuss any questions you have with your health care provider. Elsevier Patient Education  2024 [employer].
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
## 2023-11-27 DIAGNOSIS — K900 Celiac disease: Secondary | ICD-10-CM | POA: Diagnosis not present

## 2023-11-28 ENCOUNTER — Emergency Department (HOSPITAL_BASED_OUTPATIENT_CLINIC_OR_DEPARTMENT_OTHER)
Admission: EM | Admit: 2023-11-28 | Discharge: 2023-11-28 | Disposition: A | Discharge status: Home / Self Care | Attending: Emergency Medicine | Admitting: Emergency Medicine

## 2023-11-28 ENCOUNTER — Emergency Department (HOSPITAL_BASED_OUTPATIENT_CLINIC_OR_DEPARTMENT_OTHER)

## 2023-11-28 ENCOUNTER — Other Ambulatory Visit: Payer: Self-pay

## 2023-11-28 ENCOUNTER — Encounter (HOSPITAL_BASED_OUTPATIENT_CLINIC_OR_DEPARTMENT_OTHER): Payer: Self-pay | Admitting: Emergency Medicine

## 2023-11-28 DIAGNOSIS — J069 Acute upper respiratory infection, unspecified: Secondary | ICD-10-CM | POA: Diagnosis not present

## 2023-11-28 DIAGNOSIS — R059 Cough, unspecified: Secondary | ICD-10-CM | POA: Diagnosis present

## 2023-11-28 MED ORDER — ALBENDAZOLE 200 MG PO TABS: 400.0000 mg | ORAL_TABLET | Freq: Once | ORAL | 0 refills | Status: AC

## 2023-11-28 NOTE — ED Triage Notes (Signed)
 Pt reports single incident of productive cough last night. Pt provided sample brought from home.   Denies recent international travel. Denies recent illness, fever.   New chest pain starting this AM, while coughing.

## 2023-11-28 NOTE — Discharge Instructions (Addendum)
 Follow up with your family doc in the office.  Return for worsening difficulty breathing.  I have prescribed you a medication that usually will take care of the common parasitic infections.  It only requires 1 dose.

## 2023-11-28 NOTE — ED Provider Notes (Addendum)
 [place] [facility] AT [facility] Provider Note   CSN: [id number] Arrival date & time: 11/28/23  1221     Patient presents with: Cough   Katelyn Mcclain is a 73 y.o. female.   73 yo F with a chief complaints of a cough.  Has been coughing for a little while.  She ended up coughing up something last night and when she looked at it was concerned that maybe it was a worm.  She did up stating it did show to her doctor she had called them on the phone but they were unable to see her today and so directed her to the emergency department for evaluation.  She denies fevers denies significant cough.  Says that she has had bronchitis in the past and this feels like maybe a very mild case of that.  She had a salad yesterday otherwise does not think there is anything that she would have eaten that might of had worms in it.  No recent travel.  No abdominal pain.   Cough      Prior to Admission medications   Medication Sig Start Date End Date Taking? Authorizing Provider  albendazole  (ALBENZA ) 200 MG tablet Take 2 tablets (400 mg total) by mouth once for 1 dose. 11/28/23 11/28/23 Yes Emil Share, DO  albuterol  (PROVENTIL  HFA;VENTOLIN  HFA) 108 (90 BASE) MCG/ACT inhaler Inhale 1 puff into the lungs every 6 (six) hours as needed for wheezing or shortness of breath (seasonal allergies). Currently doing 2 puffs per day    [provider]  albuterol  (PROVENTIL ) (2.5 MG/3ML) 0.083% nebulizer solution Take 3 mLs (2.5 mg total) by nebulization every 6 (six) hours as needed for wheezing or shortness of breath. 02/20/22   Dean Clarity, MD  Atogepant  (QULIPTA ) 30 MG TABS Take 1 tablet (30 mg total) by mouth daily. 04/19/23   Millikan, Megan, NP  b complex vitamins capsule Take by mouth.    [provider]  Berberine Chloride (BERBERINE HCI PO) Take 450 mg by mouth daily.    [provider]  busPIRone  (BUSPAR ) 15 MG tablet TAKE 1 TABLET TWICE A DAY FOR ANXIETY 10/09/23    Rhys Boyer T, PA-C  Calcium Pantothenate (VITAMIN B5 PO) Take 500 mg by mouth.    [provider]  Calcium-Magnesium-Vitamin D (CALCIUM 1200+D3 PO) Take 1,200 mg by mouth daily.    [provider]  Cholecalciferol (VITAMIN D) 50 MCG (2000 UT) CAPS Take 1 capsule by mouth daily.    [provider]  clonazePAM  (KLONOPIN ) 0.5 MG tablet Take 0.5-1 tablets (0.25-0.5 mg total) by mouth 2 (two) times daily as needed for anxiety. 12/08/22   Rhys Boyer DASEN, PA-C  Coenzyme Q10 100 MG capsule Take 1 capsule by mouth daily.    [provider]  Denosumab  (PROLIA  [place]) Inject into the skin. Injection every 6 months    [provider]  docusate sodium (COLACE) 100 MG capsule Take 100 mg by mouth 2 (two) times daily.    [provider]  DULoxetine  (CYMBALTA ) 60 MG capsule Take 1 capsule (60 mg total) by mouth daily. 01/17/23   Rhys Boyer DASEN, PA-C  Eszopiclone  3 MG TABS Take 1 tablet (3 mg total) by mouth at bedtime as needed (sleep). 12/19/22   Rhys Boyer DASEN, PA-C  Ferrous Sulfate (IRON PO) Take 65 mg by mouth daily at 6 (six) AM.    [provider]  gabapentin  (NEURONTIN ) 100 MG capsule Take 300 mg by mouth at  bedtime. 08/09/20   [provider]  Glucosamine-Chondroitin (OSTEO BI-FLEX REGULAR STRENGTH PO) Take 2 tablets by mouth daily.    [provider]  Melatonin 3 MG TBDP Take 3 mg by mouth at bedtime as needed. 11/22/23   Dohmeier, Dedra, MD  Meth-Hyo-M Starlene Phos-Ph Sal (URO-MP) 118 MG CAPS TAKE 1 CAPSULE BY MOUTH TWICE A DAY AS NEEDED 05/04/19   McKenzie, Belvie CROME, MD  montelukast (SINGULAIR) 10 MG tablet Take 10 mg by mouth at bedtime.  11/03/13   [provider]  Multiple Vitamin (MULTIVITAMIN WITH MINERALS) TABS tablet Take 1 tablet by mouth daily.    [provider]  omeprazole (PRILOSEC) 40 MG capsule Take 40 mg by mouth 2 (two) times daily. 10/10/16   [provider]  ondansetron  (ZOFRAN ) 4 MG  tablet TAKE 1 TABLET BY MOUTH EVERY 6 HOURS AS NEEDED FOR NAUSEA - IF PHERERGAN NOT WORKING 07/30/17   [provider]  OVER THE COUNTER MEDICATION Take 500 mg by mouth daily. Vitamin B5    [provider]  OVER THE COUNTER MEDICATION Take 1,000 mg by mouth daily. Lipsomal Glutathione    [provider]  OVER THE COUNTER MEDICATION Take 500 mg by mouth daily. [employer] scientist, Historical, MD  pravastatin (PRAVACHOL) 10 MG tablet Take 10 mg by mouth daily. Patient taking differently: Take 20 mg by mouth daily.    [provider]  Probiotic Product (PROBIOTIC DAILY PO) Take 1 tablet by mouth 2 (two) times daily.     [provider]  promethazine  (PHENERGAN ) 25 MG tablet Take 1 tablet (25 mg total) by mouth every 6 (six) hours as needed for nausea or vomiting. 11/30/15   Lawyer, Lonni, PA-C  pyridoxine (B-6) 200 MG tablet Take 200 mg by mouth daily.    [provider]  rizatriptan  (MAXALT -MLT) 10 MG disintegrating tablet TAKE 1 TABLET AT ONSET OF MIGRAINE AND REPEAT IN 2 HOURS IF NEEDED 12/04/22   Millikan, Megan, NP  Vibegron (GEMTESA) 75 MG TABS Take by mouth daily.    [provider]  Zinc  50 MG TABS Take 50 mg by mouth daily.    [provider]  zonisamide  (ZONEGRAN ) 100 MG capsule TAKE 1 CAPSULE TWICE A DAY 08/15/23   Sherryl Bouchard, NP    Allergies: Oat grain extract allergy skin test, Wheat, Adhesive [tape], Morphine, Morphine and codeine, Barley grass, Desvenlafaxine, Ezetimibe, Hydrocodone  bit-homatrop mbr, Linaclotide, Metronidazole, Morphine sulfate, Oatmeal, Other, Theophylline, and Alprazolam    Review of Systems  Respiratory:  Positive for cough.     Updated Vital Signs BP (!) 147/87   Pulse 73   Temp 97.7 F (36.5 C)   Resp 16   Ht 5' 10.5 (1.791 m)   Wt 79.4 kg   SpO2 100%   BMI 24.75 kg/m   Physical Exam Vitals and nursing note reviewed.  Constitutional:      General: She is  not in acute distress.    Appearance: She is well-developed. She is not diaphoretic.  HENT:     Head: Normocephalic and atraumatic.  Eyes:     Pupils: Pupils are equal, round, and reactive to light.  Cardiovascular:     Rate and Rhythm: Normal rate and regular rhythm.     Heart sounds: No murmur heard.    No friction rub. No gallop.  Pulmonary:     Effort: Pulmonary effort is normal.     Breath sounds: No wheezing or rales.  Abdominal:     General: There is no distension.     Palpations: Abdomen is soft.     Tenderness: There is no abdominal tenderness.  Musculoskeletal:        General: No tenderness.     Cervical back: Normal range of motion and neck supple.  Skin:    General: Skin is warm and dry.  Neurological:     Mental Status: She is alert and oriented to person, place, and time.  Psychiatric:        Behavior: Behavior normal.     (all labs ordered are listed, but only abnormal results are displayed) Labs Reviewed - No data to display  EKG: None  Radiology: [facility] Chest Port 1 View Result Date: 11/28/2023 CLINICAL DATA:  Cough. EXAM: PORTABLE CHEST 1 VIEW COMPARISON:  05/21/2022. FINDINGS: The heart size and mediastinal contours are within normal limits. Both lungs are clear. The visualized skeletal structures are unremarkable. IMPRESSION: No active disease. Electronically Signed   By: Vanetta Chou M.D.   On: 11/28/2023 13:09     Procedures   Medications Ordered in the ED - No data to display                                  Medical Decision Making Amount and/or Complexity of Data Reviewed Radiology: ordered.  Risk Prescription drug management.   73 yo F with a chief complaints of worm that was found in her sputum.  She was worried that maybe she has a parasitic infection and had called her PCP.  They told her that they were unable to see her today and encouraged her to come to the ED for evaluation.  The patient is well-appearing nontoxic.  She has  benign lung exam for me.  Will obtain a screening chest x-ray here.  Will treat with a one-time dose of an antiparasitic medication.  Encouraged her to follow-up with her family doctor and if symptoms persist perhaps she would need referral to ID.  Independently interpreted by me without focal infiltrate.  Will discharge home.  PCP follow-up.  1:22 PM:  I have discussed the diagnosis/risks/treatment options with the patient.  Evaluation and diagnostic testing in the emergency department does not suggest an emergent condition requiring admission or immediate intervention beyond what has been performed at this time.  They will follow up with PCP. We also discussed returning to the ED immediately if new or worsening sx occur. We discussed the sx which are most concerning (e.g., sudden worsening pain, fever, inability to tolerate by mouth) that necessitate immediate return. Medications administered to the patient during their visit and any new prescriptions provided to the patient are listed below.  Medications given during this visit Medications - No data to display   The patient appears reasonably screen and/or stabilized for discharge and I doubt any other medical condition or other [facility] requiring further screening, evaluation, or treatment in the ED at this time prior to discharge.       Final diagnoses:  Upper respiratory tract infection, unspecified type    ED Discharge Orders          Ordered    albendazole  (ALBENZA ) 200 MG tablet   Once        11/28/23 1321               Emil Share, DO 11/28/23 1401

## 2023-11-29 ENCOUNTER — Telehealth (HOSPITAL_BASED_OUTPATIENT_CLINIC_OR_DEPARTMENT_OTHER): Payer: Self-pay | Admitting: Emergency Medicine

## 2023-11-29 DIAGNOSIS — I2699 Other pulmonary embolism without acute cor pulmonale: Secondary | ICD-10-CM

## 2023-11-30 ENCOUNTER — Encounter (HOSPITAL_BASED_OUTPATIENT_CLINIC_OR_DEPARTMENT_OTHER): Payer: Self-pay | Admitting: Emergency Medicine

## 2023-11-30 ENCOUNTER — Ambulatory Visit: Admitting: Physician Assistant

## 2023-11-30 ENCOUNTER — Emergency Department (HOSPITAL_BASED_OUTPATIENT_CLINIC_OR_DEPARTMENT_OTHER)
Admission: EM | Admit: 2023-11-30 | Discharge: 2023-11-30 | Disposition: A | Discharge status: Home / Self Care | Attending: Emergency Medicine | Admitting: Emergency Medicine

## 2023-11-30 ENCOUNTER — Other Ambulatory Visit: Payer: Self-pay

## 2023-11-30 ENCOUNTER — Emergency Department (HOSPITAL_BASED_OUTPATIENT_CLINIC_OR_DEPARTMENT_OTHER)

## 2023-11-30 DIAGNOSIS — I2699 Other pulmonary embolism without acute cor pulmonale: Secondary | ICD-10-CM | POA: Diagnosis not present

## 2023-11-30 DIAGNOSIS — Z7901 Long term (current) use of anticoagulants: Secondary | ICD-10-CM | POA: Diagnosis not present

## 2023-11-30 DIAGNOSIS — J9811 Atelectasis: Secondary | ICD-10-CM | POA: Diagnosis not present

## 2023-11-30 DIAGNOSIS — J45909 Unspecified asthma, uncomplicated: Secondary | ICD-10-CM | POA: Insufficient documentation

## 2023-11-30 DIAGNOSIS — R0789 Other chest pain: Secondary | ICD-10-CM | POA: Diagnosis not present

## 2023-11-30 LAB — CBC WITH DIFFERENTIAL/PLATELET
Abs Immature Granulocytes: 0.04 K/uL (ref 0.00–0.07)
Basophils Absolute: 0.1 K/uL (ref 0.0–0.1)
Basophils Relative: 1 %
Eosinophils Absolute: 0.3 K/uL (ref 0.0–0.5)
Eosinophils Relative: 3 %
HCT: 42.9 % (ref 36.0–46.0)
Hemoglobin: 14.5 g/dL (ref 12.0–15.0)
Immature Granulocytes: 0 %
Lymphocytes Relative: 31 %
Lymphs Abs: 3 K/uL (ref 0.7–4.0)
MCH: 31.5 pg (ref 26.0–34.0)
MCHC: 33.8 g/dL (ref 30.0–36.0)
MCV: 93.3 fL (ref 80.0–100.0)
Monocytes Absolute: 0.8 K/uL (ref 0.1–1.0)
Monocytes Relative: 8 %
Neutro Abs: 5.4 K/uL (ref 1.7–7.7)
Neutrophils Relative %: 57 %
Platelets: 340 K/uL (ref 150–400)
RBC: 4.6 MIL/uL (ref 3.87–5.11)
RDW: 13.7 % (ref 11.5–15.5)
WBC: 9.5 K/uL (ref 4.0–10.5)
nRBC: 0 % (ref 0.0–0.2)

## 2023-11-30 LAB — CBC
HCT: 43.3 % (ref 36.0–46.0)
Hemoglobin: 14.5 g/dL (ref 12.0–15.0)
MCH: 31.1 pg (ref 26.0–34.0)
MCHC: 33.5 g/dL (ref 30.0–36.0)
MCV: 92.9 fL (ref 80.0–100.0)
Platelets: 324 K/uL (ref 150–400)
RBC: 4.66 MIL/uL (ref 3.87–5.11)
RDW: 13.7 % (ref 11.5–15.5)
WBC: 9.4 K/uL (ref 4.0–10.5)
nRBC: 0 % (ref 0.0–0.2)

## 2023-11-30 LAB — HEPATIC FUNCTION PANEL
ALT: 19 U/L (ref 0–44)
AST: 28 U/L (ref 15–41)
Albumin: 4.4 g/dL (ref 3.5–5.0)
Alkaline Phosphatase: 57 U/L (ref 38–126)
Bilirubin, Direct: 0.1 mg/dL (ref 0.0–0.2)
Indirect Bilirubin: 0.2 mg/dL — ABNORMAL LOW (ref 0.3–0.9)
Total Bilirubin: 0.3 mg/dL (ref 0.0–1.2)
Total Protein: 6.9 g/dL (ref 6.5–8.1)

## 2023-11-30 LAB — OVA + PARASITE EXAM

## 2023-11-30 LAB — BASIC METABOLIC PANEL WITH GFR
Anion gap: 12 (ref 5–15)
BUN: 14 mg/dL (ref 8–23)
CO2: 22 mmol/L (ref 22–32)
Calcium: 9.5 mg/dL (ref 8.9–10.3)
Chloride: 107 mmol/L (ref 98–111)
Creatinine, Ser: 1.16 mg/dL — ABNORMAL HIGH (ref 0.44–1.00)
GFR, Estimated: 50 mL/min — ABNORMAL LOW (ref >60)
Glucose, Bld: 146 mg/dL — ABNORMAL HIGH (ref 70–99)
Potassium: 4.4 mmol/L (ref 3.5–5.1)
Sodium: 141 mmol/L (ref 135–145)

## 2023-11-30 LAB — TROPONIN T, HIGH SENSITIVITY: Troponin T High Sensitivity: <15 ng/L (ref 0–19)

## 2023-11-30 MED ORDER — APIXABAN 2.5 MG PO TABS
10.0000 mg | ORAL_TABLET | Freq: Once | ORAL | Status: AC
  Administered 2023-11-30: 10 mg via ORAL
  Filled 2023-11-30: qty 4

## 2023-11-30 MED ORDER — APIXABAN (ELIQUIS) VTE STARTER PACK (10MG AND 5MG)
ORAL_TABLET | ORAL | 0 refills | Status: DC
  Filled 2023-11-30: qty 74, 30d supply, fill #0
  Filled 2023-12-03: qty 74, 1d supply, fill #0

## 2023-11-30 MED ORDER — IOHEXOL 350 MG/ML SOLN
75.0000 mL | Freq: Once | INTRAVENOUS | Status: AC | PRN
Start: 2023-11-30 — End: 2023-11-30
  Administered 2023-11-30: 75 mL via INTRAVENOUS

## 2023-11-30 MED ORDER — APIXABAN (ELIQUIS) EDUCATION KIT FOR DVT/PE PATIENTS: PACK | Freq: Once | Status: AC

## 2023-11-30 MED ORDER — APIXABAN 5 MG PO TABS: 10.0000 mg | ORAL_TABLET | Freq: Two times a day (BID) | ORAL | 0 refills | Status: DC

## 2023-11-30 NOTE — Telephone Encounter (Signed)
 Requested Refill: omeprazole 40 mg 180 x 3 Last Refilled: 11/30/22 Last OV: 11/27/23 Next OV: 07/15/24  Approved?

## 2023-11-30 NOTE — ED Provider Notes (Signed)
 [facility] Provider Note   CSN: [id number] Arrival date & time: 11/30/23  1340     Patient presents with: Chest Pain   Katelyn Mcclain is a 72 y.o. female with a past medical history of celiac disease and GERD who presents emergency department with a chief complaint of cough and chest pain.  Patient was seen in the emergency department 2 days ago after coughing up a worm.  She reports that her coughing started the night she coughed up the worm.  She also had associated hemoptysis.  Since that time she has had a persistent dry cough since that time.  She has been taking cough medicine without improvement.  She has no significant foreign travel except for 1 year ago she went to [place] where she did ingest a lot of shellfish.  She reports that also recently she was in [place]  about 2 weeks ago and had nausea vomiting and diarrhea but states that she also has a history of celiac's disease and thought she might of eaten something containing gluten while she was there.  She complains of persistent chest pressure and discomfort for the past 2 days.  This is unchanged at all times.  She has pain with cough.  She denies any shortness of breath, fevers, wheezing    Chest Pain      Prior to Admission medications   Medication Sig Start Date End Date Taking? Authorizing Provider  apixaban  (ELIQUIS ) 5 MG TABS tablet Take 2 tablets (10 mg total) by mouth 2 (two) times daily for 2 days. 11/30/23 12/02/23 Yes Selestino Nila, PA-C  APIXABAN  (ELIQUIS ) VTE STARTER PACK (10MG  AND 5MG ) Take as directed on package: start with two-5mg  tablets twice daily for 7 days. On day 8, switch to one-5mg  tablet twice daily. 11/30/23  Yes Tangee Marszalek, PA-C  albuterol  (PROVENTIL  HFA;VENTOLIN  HFA) 108 (90 BASE) MCG/ACT inhaler Inhale 1 puff into the lungs every 6 (six) hours as needed for wheezing or shortness of breath (seasonal allergies). Currently doing 2 puffs per  day    [provider]  albuterol  (PROVENTIL ) (2.5 MG/3ML) 0.083% nebulizer solution Take 3 mLs (2.5 mg total) by nebulization every 6 (six) hours as needed for wheezing or shortness of breath. 02/20/22   Dean Clarity, MD  Atogepant  (QULIPTA ) 30 MG TABS Take 1 tablet (30 mg total) by mouth daily. 04/19/23   Millikan, Megan, NP  b complex vitamins capsule Take by mouth.    [provider]  Berberine Chloride (BERBERINE HCI PO) Take 450 mg by mouth daily.    [provider]  busPIRone  (BUSPAR ) 15 MG tablet TAKE 1 TABLET TWICE A DAY FOR ANXIETY 10/09/23   Rhys Boyer T, PA-C  Calcium Pantothenate (VITAMIN B5 PO) Take 500 mg by mouth.    [provider]  Calcium-Magnesium-Vitamin D (CALCIUM 1200+D3 PO) Take 1,200 mg by mouth daily.    [provider]  Cholecalciferol (VITAMIN D) 50 MCG (2000 UT) CAPS Take 1 capsule by mouth daily.    [provider]  clonazePAM  (KLONOPIN ) 0.5 MG tablet Take 0.5-1 tablets (0.25-0.5 mg total) by mouth 2 (two) times daily as needed for anxiety. 12/08/22   Rhys Boyer DASEN, PA-C  Coenzyme Q10 100 MG capsule Take 1 capsule by mouth daily.    [provider]  Denosumab  (PROLIA  [place]) Inject into the skin. Injection every 6 months    [provider]  docusate sodium (COLACE) 100 MG capsule Take 100 mg  by mouth 2 (two) times daily.    [provider]  DULoxetine  (CYMBALTA ) 60 MG capsule Take 1 capsule (60 mg total) by mouth daily. 01/17/23   Rhys Verneita DASEN, PA-C  Eszopiclone  3 MG TABS Take 1 tablet (3 mg total) by mouth at bedtime as needed (sleep). 12/19/22   Rhys Verneita DASEN, PA-C  Ferrous Sulfate (IRON PO) Take 65 mg by mouth daily at 6 (six) AM.    [provider]  gabapentin  (NEURONTIN ) 100 MG capsule Take 300 mg by mouth at bedtime. 08/09/20   [provider]  Glucosamine-Chondroitin (OSTEO BI-FLEX REGULAR STRENGTH PO) Take 2 tablets by mouth daily.    [provider]   Melatonin 3 MG TBDP Take 3 mg by mouth at bedtime as needed. 11/22/23   Dohmeier, Dedra, MD  Meth-Hyo-M Starlene Phos-Ph Sal (URO-MP) 118 MG CAPS TAKE 1 CAPSULE BY MOUTH TWICE A DAY AS NEEDED 05/04/19   McKenzie, Belvie CROME, MD  montelukast (SINGULAIR) 10 MG tablet Take 10 mg by mouth at bedtime.  11/03/13   [provider]  Multiple Vitamin (MULTIVITAMIN WITH MINERALS) TABS tablet Take 1 tablet by mouth daily.    [provider]  omeprazole (PRILOSEC) 40 MG capsule Take 40 mg by mouth 2 (two) times daily. 10/10/16   [provider]  ondansetron  (ZOFRAN ) 4 MG tablet TAKE 1 TABLET BY MOUTH EVERY 6 HOURS AS NEEDED FOR NAUSEA - IF PHERERGAN NOT WORKING 07/30/17   [provider]  OVER THE COUNTER MEDICATION Take 500 mg by mouth daily. Vitamin B5    [provider]  OVER THE COUNTER MEDICATION Take 1,000 mg by mouth daily. Lipsomal Glutathione    [provider]  OVER THE COUNTER MEDICATION Take 500 mg by mouth daily. [employer] scientist, Historical, MD  pravastatin (PRAVACHOL) 10 MG tablet Take 10 mg by mouth daily. Patient taking differently: Take 20 mg by mouth daily.    [provider]  Probiotic Product (PROBIOTIC DAILY PO) Take 1 tablet by mouth 2 (two) times daily.     [provider]  promethazine  (PHENERGAN ) 25 MG tablet Take 1 tablet (25 mg total) by mouth every 6 (six) hours as needed for nausea or vomiting. 11/30/15   Lawyer, Lonni, PA-C  pyridoxine (B-6) 200 MG tablet Take 200 mg by mouth daily.    [provider]  rizatriptan  (MAXALT -MLT) 10 MG disintegrating tablet TAKE 1 TABLET AT ONSET OF MIGRAINE AND REPEAT IN 2 HOURS IF NEEDED 12/04/22   Millikan, Megan, NP  Vibegron (GEMTESA) 75 MG TABS Take by mouth daily.    [provider]  Zinc  50 MG TABS Take 50 mg by mouth daily.    [provider]  zonisamide  (ZONEGRAN ) 100 MG capsule TAKE 1 CAPSULE TWICE A DAY 08/15/23   Sherryl Bouchard,  NP    Allergies: Oat grain extract allergy skin test, Wheat, Adhesive [tape], Morphine, Morphine and codeine, Barley grass, Desvenlafaxine, Ezetimibe, Hydrocodone  bit-homatrop mbr, Linaclotide, Metronidazole, Morphine sulfate, Oatmeal, Other, Theophylline, and Alprazolam    Review of Systems  Cardiovascular:  Positive for chest pain.    Updated Vital Signs BP (!) 143/80 (BP Location: Left Arm)   Pulse 67   Temp 97.8 F (36.6 C) (Oral)   Resp 19   Ht 5' 10.5 (1.791 m)   SpO2 99%   BMI 24.75 kg/m   Physical Exam Vitals and nursing note reviewed.  Constitutional:      General: She is  not in acute distress.    Appearance: She is well-developed. She is not diaphoretic.  HENT:     Head: Normocephalic and atraumatic.     Right Ear: External ear normal.     Left Ear: External ear normal.     Nose: Nose normal.     Mouth/Throat:     Mouth: Mucous membranes are moist.  Eyes:     General: No scleral icterus.    Conjunctiva/sclera: Conjunctivae normal.  Cardiovascular:     Rate and Rhythm: Normal rate and regular rhythm.     Heart sounds: Normal heart sounds. No murmur heard.    No friction rub. No gallop.  Pulmonary:     Effort: Pulmonary effort is normal. No respiratory distress.     Breath sounds: Normal breath sounds.  Abdominal:     General: Bowel sounds are normal. There is no distension.     Palpations: Abdomen is soft. There is no mass.     Tenderness: There is no abdominal tenderness. There is no guarding.  Musculoskeletal:     Cervical back: Normal range of motion.  Skin:    General: Skin is warm and dry.  Neurological:     Mental Status: She is alert and oriented to person, place, and time.  Psychiatric:        Behavior: Behavior normal.     (all labs ordered are listed, but only abnormal results are displayed) Labs Reviewed  BASIC METABOLIC PANEL WITH GFR - Abnormal; Notable for the following components:      Result Value   Glucose, Bld 146 (*)     Creatinine, Ser 1.16 (*)    GFR, Estimated 50 (*)    All other components within normal limits  HEPATIC FUNCTION PANEL - Abnormal; Notable for the following components:   Indirect Bilirubin 0.2 (*)    All other components within normal limits  OVA + PARASITE EXAM  CBC  CBC WITH DIFFERENTIAL/PLATELET  TROPONIN T, HIGH SENSITIVITY    EKG: EKG Interpretation Date/Time:  Friday November 30 2023 13:54:16 EDT Ventricular Rate:  80 PR Interval:  172 QRS Duration:  89 QT Interval:  383 QTC Calculation: 442 R Axis:   1  Text Interpretation: Sinus rhythm Low voltage, precordial leads Confirmed by Darra Chew [phone]) on 11/30/2023 2:04:36 PM  Radiology: CT Angio Chest PE W and/or Wo Contrast Result Date: 11/30/2023 CLINICAL DATA:  Pulmonary embolism (PE) suspected, high prob ? lung flukes, + hemoptysis EXAM: CT ANGIOGRAPHY CHEST WITH CONTRAST TECHNIQUE: Multidetector CT imaging of the chest was performed using the standard protocol during bolus administration of intravenous contrast. Multiplanar CT image reconstructions and MIPs were obtained to evaluate the vascular anatomy. RADIATION DOSE REDUCTION: This exam was performed according to the departmental dose-optimization program which includes automated exposure control, adjustment of the mA and/or kV according to patient size and/or use of iterative reconstruction technique. CONTRAST:  75mL OMNIPAQUE  IOHEXOL  350 MG/ML SOLN COMPARISON:  June 01, 2016, February 20, 2022, Aug 16, 2023 FINDINGS: Pulmonary Embolism: Small distal segmental and subsegmental embolus within the anterior right upper lobe (axial 53-51). Flattening of the intraventricular septum with the RV/LV ratio = 1. No reflux of contrast into the hepatic veins. The pulmonary artery is not dilated. Cardiovascular: No cardiomegaly or pericardial effusion.No aortic aneurysm. Mediastinum/Nodes: No mediastinal mass.No mediastinal, hilar, or axillary lymphadenopathy. Lungs/Pleura: The midline  trachea and bronchi are patent. No focal airspace consolidation, pleural effusion, or pneumothorax. Posterior bibasilar dependent atelectasis. Musculoskeletal: No acute fracture or destructive  bone lesion. Osteopenia. Multilevel thoracic osteophytosis. Upper Abdomen: No acute abnormality in the partially visualized upper abdomen. Unchanged small nodule in the hepatorenal fossa, likely benign. Review of the MIP images confirms the above findings. IMPRESSION: 1. Single small distal segmental/subsegmental embolus within the anterior right upper lobe (axial 51-53). CT findings are equivocal for right heart strain and if this remains of clinical concern, an echocardiogram may be of benefit for further characterization. 2. The tracheobronchial tree is patent. No pneumonia, pulmonary edema, or pleural effusion. Critical Value/emergent results were called by telephone at the time of interpretation on 11/30/2023 at 6:15 pm to provider Solymar Grace , who verbally acknowledged these results. Electronically Signed   By: Rogelia Myers M.D.   On: 11/30/2023 18:20     .Critical Care  Performed by: Arloa Chroman, PA-C Authorized by: Arloa Chroman, PA-C   Critical care provider statement:    Critical care time (minutes):  60   Critical care time was exclusive of:  Separately billable procedures and treating other patients   Critical care was necessary to treat or prevent imminent or life-threatening deterioration of the following conditions: Acute pulmonary embolus.   Critical care was time spent personally by me on the following activities:  Development of treatment plan with patient or surrogate, discussions with consultants, evaluation of patient's response to treatment, examination of patient, ordering and review of laboratory studies, ordering and review of radiographic studies, ordering and performing treatments and interventions, pulse oximetry, re-evaluation of patient's condition and review of old  charts    Medications Ordered in the ED  iohexol  (OMNIPAQUE ) 350 MG/ML injection 75 mL (75 mLs Intravenous Contrast Given 11/30/23 1749)  apixaban  (ELIQUIS ) tablet 10 mg (10 mg Oral Given 11/30/23 1939)  apixaban  (ELIQUIS ) Education Kit for DVT/PE patients ( Does not apply Given 11/30/23 1943)    Clinical Course as of 12/01/23 1100  Fri Nov 30, 2023  1530 Creatinine(!): 1.16 Creatinine appears to be at baseline for the patient [AH]  1530 Troponin T High Sensitivity: <15 [AH]  1619 Eosinophils Absolute: 0.3 [AH]  1821 Case discussed w/ radiology patient has a single subsegmental pulmonary embolus.  Findings of right heart strain are equivocal with some mild flattening of the septum however the radiologist does not feel like the [AH]  1825 PESI Score: 71 Low risk of 30-day mortality: 3.5%. Risk Class II corresponds to a total point score between 66 and 85, with a 30-day mortality risk of 3.5% (95% CI: 2.5-4.7%). [AH]  1900 Patient was able to ambulate in the emergency department with oxygen saturations between 97 and 100% [AH]    Clinical Course User Index [AH] Arloa Chroman, PA-C                                 Medical Decision Making Amount and/or Complexity of Data Reviewed Labs: ordered. Decision-making details documented in ED Course. Radiology: ordered.  Risk Prescription drug management.   This patient presents to the ED for concern of chest discomfort, cough and hemoptysis, this involves an extensive number of treatment options, and is a complaint that carries with it a high risk of complications and morbidity.  Differential diagnosis of hemoptysis includes, but is not limited to: bronchitis, bronchiectasis, bronchogenic carcinoma, pulmonary vasculature disease, left ventricular failure, mitral stenosis, pulmonary embolism, AV malformation, diseases of the pulmonary parenchyma including pneumonia, abscess, tuberculosis, Aspergilloma parasitic infection, Goodpasture's or  Wegener's granulomatosis, SLE, crack cocaine inhalation, leukemia,  anticoagulation or acute cough. Differential diagnosis for potential lung fluid infestation includes.  Paragonimus kellicotti ( [place] lung fluke) to be the most likely diagnosis given her lack of travel outside of the [place]  and [place].  This physician usually a cause cursed by eating undercooked crayfish or carotids.  Less likely causes would be Strongyloides, Asdxcareis, or Schistosoma   Co morbidities:   has a past medical history of Abscessed tooth, Allergic rhinitis, Asthma, Asthma, Celiac disease, Depression with anxiety, Dysmenorrhea, Gastroparesis, GERD (gastroesophageal reflux disease), low back pain, IBS (irritable bowel syndrome), Migraine, Migraine without aura, with intractable migraine, so stated, without mention of status migrainosus (05/20/2013), Osteopenia, and Rhinovirus (2023).   Social Determinants of Health:   SDOH Screenings   Food Insecurity: Low Risk  (11/27/2023)   Received from [facility]  Housing: Low Risk  (11/27/2023)   Received from [facility]  Transportation Needs: No Transportation Needs (11/27/2023)   Received from [facility]  Utilities: Low Risk  (11/27/2023)   Received from [facility]  Depression (PHQ2-9): High Risk (05/24/2022)  Financial Resource Strain: Low Risk  (05/24/2022)  Physical Activity: Inactive (05/24/2022)  Social Connections: Socially Integrated (05/24/2022)  Stress: Stress Concern Present (05/24/2022)  Tobacco Use: Medium Risk (11/30/2023)     Additional history:  {Additional history obtained from husband at bedside  Previous records reviewed source obtained and reviewed including previous lab values and both ED and outpatient neurology notes.  In 05/01/2019 patient was seen for cough noted to have a leukocytosis of 26,000 with an eosinophilia at that time.  Was attributed to use of dexamethasone   Lab Tests:  I Ordered, and personally interpreted  labs.  The pertinent results include:   As per ED course Imaging Studies:  As per ED course I agree with the radiologist interpretation  Cardiac Monitoring/ECG:  The patient was maintained on a cardiac monitor.  I personally viewed and interpreted the cardiac monitored which showed an underlying rhythm of:   Sinus rhythm at a rate of 90      Medicines ordered and prescription drug management:  I ordered medication including  Medications  iohexol  (OMNIPAQUE ) 350 MG/ML injection 75 mL (75 mLs Intravenous Contrast Given 11/30/23 1749)  apixaban  (ELIQUIS ) tablet 10 mg (10 mg Oral Given 11/30/23 1939)  apixaban  (ELIQUIS ) Education Kit for DVT/PE patients ( Does not apply Given 11/30/23 1943)   for acute subsegmental PE Reevaluation of the patient after these medicines showed that the patient stayed the same I have reviewed the patients home medicines and have made adjustments as needed  Test Considered:    Critical Interventions:    Consultations Obtained: Imaging discussed with Dr. Carlean as per ED course  Problem List / ED Course:     ICD-10-CM   1. PE (pulmonary thromboembolism) (HCC)  I26.99       MDM: patient presents for persistent chest discomfort with hemoptysis after coughing spell 2 days ago and coughing up a worm.  Patient diagnosis consistent with acute PE. She does not at this time have evidence of lung fluke (absence of cystic formations in the lungs, no eosinophilia). The patient further does not have evidence of acute heart strain clinically  being HDS and without hypoxia at rest or with exertion. Patient further has a low risk PESI score. Patient reports that she has had Left leg swelling onset 1 month ago and was seen by a  vein specialist who was unable to find an acute blood clot. She has been wearing a  compression stocking.  After extensive discussion with the patient and in shared decision making patient will be discharged on Eliquis . She has been  given her first dose and will receive afew doses to make it through the weekend and then will return to pick up an eliquis  starter pack on Monday. I discussed reasons to seek immediate medicatl care tin the ED and need for PCP follow up for continued mgmt and tx of PE over the next 6 mos.    Dispostion:  After consideration of the diagnostic results and the patients response to treatment, I feel that the patent would benefit from discharge..      Final diagnoses:  PE (pulmonary thromboembolism) (HCC)    ED Discharge Orders          Ordered    APIXABAN  (ELIQUIS ) VTE STARTER PACK (10MG  AND 5MG )       Note to Pharmacy: If starter pack unavailable, substitute with seventy-four 5 mg apixaban  tabs following the above SIG directions.   11/30/23 1909    apixaban  (ELIQUIS ) 5 MG TABS tablet  2 times daily        11/30/23 1911               Arloa Chroman, PA-C 12/01/23 1112    Long, Fonda MATSU, MD 12/03/23 [phone]

## 2023-11-30 NOTE — ED Triage Notes (Signed)
 Pt c/o mid chest pain since Wednesday, reports pain is now steady, worsening. C/o general fatigue.  Good po intake.   Used home MDI this AM, no relief.

## 2023-11-30 NOTE — ED Notes (Signed)
 Patient transported to CT

## 2023-11-30 NOTE — ED Notes (Signed)
   11/30/23 1849  Resting  Supplemental oxygen during test? No  Resting Sp02 100  Lap 1 (250 feet)  HR 84  02 Sat 97   Ambulated without in increased WOB, SpO2 97-100%, HR 84 max. Resp even and unlabored.

## 2023-11-30 NOTE — ED Notes (Signed)
 Pt only able to produce small amount of thick cream sputum for O& P.  EDP aware

## 2023-11-30 NOTE — Discharge Instructions (Addendum)
 ### Pulmonary Embolism: Apixaban  Info     Pulmonary embolism (PE) is a blood clot in the lungs that can be life-threatening. Treatment with apixaban  (brand name: Eliquis ) is a proven and effective way to lower the risk of further clots and help prevent serious complications.[1][2][3][4][5]      **How Apixaban  Treats Pulmonary Embolism**      Apixaban  is a type of blood thinner called a direct oral anticoagulant (DOAC). It works by blocking a protein in your blood called factor Xa, which helps form clots. By slowing down clotting, apixaban  helps prevent new clots from forming and stops existing clots from getting bigger.[2][3][4]      **Expected Length of Treatment**      - Most people with a first PE caused by a temporary risk (like surgery or injury) need to take apixaban  for **at least 3 months**.[1][3]      - If the PE was unprovoked (no clear cause) or due to a long-term risk (like cancer or certain medical conditions), longer or even indefinite treatment may be recommended, as long as the risk of bleeding is not high.[1][3][5]      - After the first 6 months of full-dose treatment, your doctor may lower the dose for long-term prevention.[1][4]      - Always follow your doctor's instructions about how long to take apixaban .      **Typical Dosage**      - For the first 7 days: **10 mg twice daily**      - After 7 days: **5 mg twice daily**      - For long-term prevention after at least 6 months: **2.5 mg twice daily**[4]      **When to Seek Immediate Medical Care**      Call 911 or go to the emergency room if you experience:      - Signs of serious bleeding: coughing up blood, vomiting blood, black or bloody stools, severe nosebleeds, or heavy menstrual bleeding      - Sudden shortness of breath, chest pain, or fainting      - Severe headache, confusion, or weakness in one side of your body      - Any injury that causes heavy bleeding or cannot be stopped[4][5]       **Precautions While Taking a Blood Thinner**      - Take apixaban  exactly as prescribed. If you miss a dose, take it as soon as you remember on the same day, but do not double up doses.[4]      - Tell all your healthcare providers and dentist that you are taking a blood thinner.      - Avoid activities with a high risk of injury or bleeding (contact sports, using sharp objects carelessly).      - Use a soft toothbrush and electric razor to lower the risk of bleeding.      - Do not start or stop any medicines, including over-the-counter drugs and supplements, without checking with your doctor. Some medicines can increase bleeding risk or affect how apixaban  works.      - If you need surgery or dental work, your doctor may advise you to stop apixaban  temporarily.[4]      - Limit alcohol, as it can increase bleeding risk.      - Watch for signs of bleeding and report any unusual bruising or bleeding to your doctor.      **Other Important Information**      - Apixaban  does not require regular  blood tests to check your levels, but you should have routine check-ups to monitor for side effects and kidney/liver function.[2][3]      - Do not take apixaban  if you are pregnant or planning pregnancy; other medicines are safer in pregnancy.[5]      - If you have severe liver or kidney problems, apixaban  may not be right for you.[3][4]      Always follow your doctor's advice and ask questions if you are unsure about your treatment.      ### References  1. Pulmonary Embolism. Kahn SR, de Wit K. The [place] Journal of Medicine. 2022;387(1):45-57. doi:10.1056/NEJMcp2116489. 2. Apixaban : A Review in Venous Thromboembolism. Delrae LODGE, Garnock-Jones KP. Drugs. 2016;76(15):1493-1504. doi:10.1007/s40265-339 888 7436-6. 3. Cancer-Associated Venous Thromboembolic Disease, Version 2.2024, NCCN Clinical Practice Guidelines in Oncology. Streiff MB, Holmstrom B, Angelini D, et al. Journal of the [employer] : National [employer]. 2024;22(7):483-506. doi:10.6004/jnccn.2024.0046. 4. ELIQUIS . Food and Drug Administration. Updated date: 2022-11-10. 5. Acute Pulmonary Embolism: A Review. Freund Y, Cohen-Aubart F, Bloom B. JAMA. 2022;328(13):1336-1345. doi:10.1001/jama.7977.83184.

## 2023-12-01 ENCOUNTER — Other Ambulatory Visit (HOSPITAL_BASED_OUTPATIENT_CLINIC_OR_DEPARTMENT_OTHER): Payer: Self-pay

## 2023-12-01 MED ORDER — APIXABAN (ELIQUIS) VTE STARTER PACK (10MG AND 5MG): ORAL_TABLET | ORAL | 0 refills | Status: AC

## 2023-12-01 NOTE — Telephone Encounter (Cosign Needed)
 Patient called needing Rx for Eliquis  starter pack sent to [facility] which was completed.

## 2023-12-03 ENCOUNTER — Other Ambulatory Visit (HOSPITAL_BASED_OUTPATIENT_CLINIC_OR_DEPARTMENT_OTHER): Payer: Self-pay

## 2023-12-03 ENCOUNTER — Telehealth: Payer: Self-pay | Admitting: Neurology

## 2023-12-03 MED ORDER — QULIPTA 30 MG PO TABS: 30.0000 mg | ORAL_TABLET | Freq: Every day | ORAL | 3 refills | Status: AC

## 2023-12-03 MED ORDER — RIZATRIPTAN BENZOATE 10 MG PO TBDP: ORAL_TABLET | ORAL | 3 refills | Status: AC

## 2023-12-03 NOTE — Addendum Note (Signed)
 Addended by: CHALICE SAUNAS on: 12/03/2023 01:02 PM   Modules accepted: Orders

## 2023-12-03 NOTE — Telephone Encounter (Signed)
 Patient notified went to the ER on August 22 and 23. On 12/01/23 they did a CT Scan with contrast and found pulmonary embolism. Would need to reschedule the MRI [facility] ordered for a later date, until get these things straighten out.   Informed patient to call [facility] to reschedule.

## 2023-12-04 LAB — OVA + PARASITE EXAM

## 2023-12-07 ENCOUNTER — Ambulatory Visit: Admitting: Physician Assistant

## 2023-12-07 ENCOUNTER — Encounter: Payer: Self-pay | Admitting: Physician Assistant

## 2023-12-07 DIAGNOSIS — F3341 Major depressive disorder, recurrent, in partial remission: Secondary | ICD-10-CM

## 2023-12-07 DIAGNOSIS — G4700 Insomnia, unspecified: Secondary | ICD-10-CM

## 2023-12-07 DIAGNOSIS — F411 Generalized anxiety disorder: Secondary | ICD-10-CM

## 2023-12-07 MED ORDER — BUSPIRONE HCL 15 MG PO TABS: ORAL_TABLET | ORAL | 1 refills | Status: DC

## 2023-12-07 MED ORDER — DULOXETINE HCL 60 MG PO CPEP: 60.0000 mg | ORAL_CAPSULE | Freq: Every day | ORAL | 1 refills | Status: AC
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
## 2023-12-12 DIAGNOSIS — D72829 Elevated white blood cell count, unspecified: Secondary | ICD-10-CM | POA: Diagnosis not present

## 2023-12-12 DIAGNOSIS — E860 Dehydration: Secondary | ICD-10-CM | POA: Diagnosis not present

## 2023-12-12 LAB — MISC LABCORP TEST (SEND OUT): Labcorp test code: 8219

## 2023-12-14 ENCOUNTER — Other Ambulatory Visit: Payer: Self-pay | Admitting: Internal Medicine

## 2023-12-14 DIAGNOSIS — Z1231 Encounter for screening mammogram for malignant neoplasm of breast: Secondary | ICD-10-CM

## 2023-12-16 ENCOUNTER — Other Ambulatory Visit

## 2023-12-17 ENCOUNTER — Telehealth: Payer: Self-pay | Admitting: Adult Health

## 2023-12-17 DIAGNOSIS — I2693 Single subsegmental pulmonary embolism without acute cor pulmonale: Secondary | ICD-10-CM | POA: Diagnosis not present

## 2023-12-17 DIAGNOSIS — E782 Mixed hyperlipidemia: Secondary | ICD-10-CM | POA: Diagnosis not present

## 2023-12-17 DIAGNOSIS — N1831 Chronic kidney disease, stage 3a: Secondary | ICD-10-CM | POA: Diagnosis not present

## 2023-12-17 NOTE — Telephone Encounter (Signed)
 Pt called to inform Md that she has been having really bad Migrines and that  she is requesting to speak  to Md about  [facility] the patient state Migraines has gotten worse to the point  Pt vision is getting blurry and is feeling really Nauseas the patient would like to discuss with MD .

## 2023-12-17 NOTE — Telephone Encounter (Signed)
 Called the pt back and offered a Depacon infusion through our infusion clinic. They offered a 10:30 am slot. Pt accepted. Will provide the order to intrafusion.

## 2023-12-17 NOTE — Telephone Encounter (Addendum)
 Spoke to patient states has had Migraine for 3 weeks.  Pt states Nausea,aura, blurry vision,sensitivity  to light and sound . Pt states has been taken her qulipta  daily ,acute rizatriptan  and zonisamide . Will forward to Dr Chalice for recommendations Pt is asking an infusion to break cycle . Did Advise patient to go to Urgent care to be evaluated Pt is very hesitant to go to urgent care and really wants MD recommendations. Pt thanked me for calling  Pt states started eliquis  titration pack 3 weeks ago on day 17 of titration pack 5 mg 2x daily

## 2023-12-18 ENCOUNTER — Ambulatory Visit (INDEPENDENT_AMBULATORY_CARE_PROVIDER_SITE_OTHER): Admitting: Neurology

## 2023-12-18 DIAGNOSIS — F5102 Adjustment insomnia: Secondary | ICD-10-CM

## 2023-12-18 DIAGNOSIS — G44019 Episodic cluster headache, not intractable: Secondary | ICD-10-CM

## 2023-12-18 DIAGNOSIS — G4710 Hypersomnia, unspecified: Secondary | ICD-10-CM | POA: Diagnosis not present

## 2023-12-18 DIAGNOSIS — G8929 Other chronic pain: Secondary | ICD-10-CM

## 2023-12-18 DIAGNOSIS — G43101 Migraine with aura, not intractable, with status migrainosus: Secondary | ICD-10-CM

## 2023-12-18 NOTE — Telephone Encounter (Signed)
 Noted

## 2023-12-19 NOTE — Progress Notes (Unsigned)
 SABRA

## 2023-12-21 ENCOUNTER — Ambulatory Visit: Payer: Self-pay | Admitting: Neurology
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
## 2023-12-25 ENCOUNTER — Encounter

## 2023-12-25 DIAGNOSIS — Z1231 Encounter for screening mammogram for malignant neoplasm of breast: Secondary | ICD-10-CM

## 2024-01-03 ENCOUNTER — Ambulatory Visit
Admission: RE | Admit: 2024-01-03 | Discharge: 2024-01-03 | Disposition: A | Discharge status: Home / Self Care | Source: Ambulatory Visit | Attending: Neurology | Admitting: Neurology

## 2024-01-03 DIAGNOSIS — G43101 Migraine with aura, not intractable, with status migrainosus: Secondary | ICD-10-CM

## 2024-01-03 DIAGNOSIS — F5102 Adjustment insomnia: Secondary | ICD-10-CM

## 2024-01-03 DIAGNOSIS — G4701 Insomnia due to medical condition: Secondary | ICD-10-CM

## 2024-01-03 DIAGNOSIS — G44019 Episodic cluster headache, not intractable: Secondary | ICD-10-CM

## 2024-01-03 MED ORDER — GADOPICLENOL 0.5 MMOL/ML IV SOLN
7.5000 mL | Freq: Once | INTRAVENOUS | Status: AC | PRN
  Administered 2024-01-03: 7.5 mL via INTRAVENOUS

## 2024-01-06 ENCOUNTER — Emergency Department (HOSPITAL_BASED_OUTPATIENT_CLINIC_OR_DEPARTMENT_OTHER)

## 2024-01-06 ENCOUNTER — Other Ambulatory Visit: Payer: Self-pay

## 2024-01-06 ENCOUNTER — Emergency Department (HOSPITAL_BASED_OUTPATIENT_CLINIC_OR_DEPARTMENT_OTHER)
Admission: EM | Admit: 2024-01-06 | Discharge: 2024-01-06 | Disposition: A | Discharge status: Home / Self Care | Attending: Emergency Medicine | Admitting: Emergency Medicine

## 2024-01-06 ENCOUNTER — Encounter (HOSPITAL_BASED_OUTPATIENT_CLINIC_OR_DEPARTMENT_OTHER): Payer: Self-pay | Admitting: Emergency Medicine

## 2024-01-06 DIAGNOSIS — R0602 Shortness of breath: Secondary | ICD-10-CM | POA: Insufficient documentation

## 2024-01-06 DIAGNOSIS — R079 Chest pain, unspecified: Secondary | ICD-10-CM | POA: Diagnosis not present

## 2024-01-06 DIAGNOSIS — K449 Diaphragmatic hernia without obstruction or gangrene: Secondary | ICD-10-CM | POA: Diagnosis not present

## 2024-01-06 DIAGNOSIS — Z7901 Long term (current) use of anticoagulants: Secondary | ICD-10-CM | POA: Diagnosis not present

## 2024-01-06 DIAGNOSIS — R042 Hemoptysis: Secondary | ICD-10-CM | POA: Diagnosis not present

## 2024-01-06 DIAGNOSIS — R0789 Other chest pain: Secondary | ICD-10-CM | POA: Diagnosis not present

## 2024-01-06 HISTORY — DX: Other pulmonary embolism without acute cor pulmonale: I26.99

## 2024-01-06 LAB — CBC
HCT: 41.1 % (ref 36.0–46.0)
Hemoglobin: 13.7 g/dL (ref 12.0–15.0)
MCH: 31 pg (ref 26.0–34.0)
MCHC: 33.3 g/dL (ref 30.0–36.0)
MCV: 93 fL (ref 80.0–100.0)
Platelets: 303 K/uL (ref 150–400)
RBC: 4.42 MIL/uL (ref 3.87–5.11)
RDW: 13.5 % (ref 11.5–15.5)
WBC: 6.9 K/uL (ref 4.0–10.5)
nRBC: 0 % (ref 0.0–0.2)

## 2024-01-06 LAB — BASIC METABOLIC PANEL WITH GFR
Anion gap: 9 (ref 5–15)
BUN: 15 mg/dL (ref 8–23)
CO2: 26 mmol/L (ref 22–32)
Calcium: 9.2 mg/dL (ref 8.9–10.3)
Chloride: 106 mmol/L (ref 98–111)
Creatinine, Ser: 1.06 mg/dL — ABNORMAL HIGH (ref 0.44–1.00)
GFR, Estimated: 56 mL/min — ABNORMAL LOW (ref >60)
Glucose, Bld: 125 mg/dL — ABNORMAL HIGH (ref 70–99)
Potassium: 3.8 mmol/L (ref 3.5–5.1)
Sodium: 142 mmol/L (ref 135–145)

## 2024-01-06 LAB — TROPONIN T, HIGH SENSITIVITY
Troponin T High Sensitivity: <15 ng/L (ref 0–19)
Troponin T High Sensitivity: <15 ng/L (ref 0–19)

## 2024-01-06 MED ORDER — IOHEXOL 350 MG/ML SOLN
100.0000 mL | Freq: Once | INTRAVENOUS | Status: AC | PRN
  Administered 2024-01-06: 100 mL via INTRAVENOUS

## 2024-01-06 NOTE — ED Triage Notes (Signed)
 Mid chest pain, shortness of breath since this morning. reports coughing up bright red blood --states about a tablespoon of blood  Denies N/V, fevers  On eliquis 

## 2024-01-06 NOTE — Discharge Instructions (Addendum)
 Return if symptoms worsen.  Follow-up with your primary care doctor.  Today is unremarkable.

## 2024-01-06 NOTE — ED Provider Notes (Signed)
 [place] [facility] AT [facility] Provider Note   CSN: [id number] Arrival date & time: 01/06/24  1349     Patient presents with: Chest Pain   Katelyn Mcclain is a 73 y.o. female.   Patient here with cough some blood in the cough earlier but now feeling better and resolved.  She felt a little short of breath.  Recently put on Eliquis  for blood clots.  She is asymptomatic now.  No nausea vomiting diarrhea.  No chest pain.  No fever or chills.  She says it is a really deep cough.  There is little bit of blood in there but not much.  Has not occurred again.  She has a history of IBS migraines.  The history is provided by the patient.       Prior to Admission medications   Medication Sig Start Date End Date Taking? Authorizing Provider  albuterol  (PROVENTIL  HFA;VENTOLIN  HFA) 108 (90 BASE) MCG/ACT inhaler Inhale 1 puff into the lungs every 6 (six) hours as needed for wheezing or shortness of breath (seasonal allergies). Currently doing 2 puffs per day    [provider]  albuterol  (PROVENTIL ) (2.5 MG/3ML) 0.083% nebulizer solution Take 3 mLs (2.5 mg total) by nebulization every 6 (six) hours as needed for wheezing or shortness of breath. 02/20/22   Dean Clarity, MD  APIXABAN  (ELIQUIS ) VTE STARTER PACK (10MG  AND 5MG ) Take as directed on package: start with two-5mg  tablets twice daily for 7 days. On day 8, switch to one-5mg  tablet twice daily. 12/01/23   Odell Balls, PA-C  Atogepant  (QULIPTA ) 30 MG TABS Take 1 tablet (30 mg total) by mouth daily. 12/03/23   Dohmeier, Dedra, MD  b complex vitamins capsule Take by mouth.    [provider]  Berberine Chloride (BERBERINE HCI PO) Take 450 mg by mouth daily.    [provider]  busPIRone  (BUSPAR ) 15 MG tablet TAKE 1 TABLET TWICE A DAY FOR ANXIETY 12/07/23   Rhys Boyer T, PA-C  Calcium Pantothenate (VITAMIN B5 PO) Take 500 mg by mouth.    [provider]  Calcium-Magnesium-Vitamin D  (CALCIUM 1200+D3 PO) Take 1,200 mg by mouth daily.    [provider]  Cholecalciferol (VITAMIN D) 50 MCG (2000 UT) CAPS Take 1 capsule by mouth daily.    [provider]  clonazePAM  (KLONOPIN ) 0.5 MG tablet Take 0.5-1 tablets (0.25-0.5 mg total) by mouth 2 (two) times daily as needed for anxiety. 12/08/22   Rhys Boyer DASEN, PA-C  Coenzyme Q10 100 MG capsule Take 1 capsule by mouth daily.    [provider]  Denosumab  (PROLIA  [place]) Inject into the skin. Injection every 6 months    [provider]  docusate sodium (COLACE) 100 MG capsule Take 100 mg by mouth 2 (two) times daily.    [provider]  DULoxetine  (CYMBALTA ) 60 MG capsule Take 1 capsule (60 mg total) by mouth daily. 12/07/23   Rhys Boyer DASEN, PA-C  Eszopiclone  3 MG TABS Take 1 tablet (3 mg total) by mouth at bedtime as needed (sleep). 12/19/22   Rhys Boyer DASEN, PA-C  Ferrous Sulfate (IRON PO) Take 65 mg by mouth daily at 6 (six) AM.    [provider]  gabapentin  (NEURONTIN ) 100 MG capsule Take 300 mg by mouth at bedtime. 08/09/20   [provider]  Glucosamine-Chondroitin (OSTEO BI-FLEX REGULAR STRENGTH PO) Take 2 tablets by mouth daily.    [provider]  Melatonin 3 MG TBDP Take  3 mg by mouth at bedtime as needed. 11/22/23   Dohmeier, Dedra, MD  Meth-Hyo-M Starlene Phos-Ph Sal (URO-MP) 118 MG CAPS TAKE 1 CAPSULE BY MOUTH TWICE A DAY AS NEEDED 05/04/19   McKenzie, Belvie CROME, MD  montelukast (SINGULAIR) 10 MG tablet Take 10 mg by mouth at bedtime.  11/03/13   [provider]  Multiple Vitamin (MULTIVITAMIN WITH MINERALS) TABS tablet Take 1 tablet by mouth daily.    [provider]  omeprazole (PRILOSEC) 40 MG capsule Take 40 mg by mouth 2 (two) times daily. 10/10/16   [provider]  ondansetron  (ZOFRAN ) 4 MG tablet TAKE 1 TABLET BY MOUTH EVERY 6 HOURS AS NEEDED FOR NAUSEA - IF PHERERGAN NOT WORKING 07/30/17   [provider]  OVER THE  COUNTER MEDICATION Take 500 mg by mouth daily. Vitamin B5    [provider]  OVER THE COUNTER MEDICATION Take 1,000 mg by mouth daily. Lipsomal Glutathione    [provider]  OVER THE COUNTER MEDICATION Take 500 mg by mouth daily. [employer] scientist, Historical, MD  pravastatin (PRAVACHOL) 10 MG tablet Take 10 mg by mouth daily.    [provider]  Probiotic Product (PROBIOTIC DAILY PO) Take 1 tablet by mouth 2 (two) times daily.     [provider]  promethazine  (PHENERGAN ) 25 MG tablet Take 1 tablet (25 mg total) by mouth every 6 (six) hours as needed for nausea or vomiting. 11/30/15   Lawyer, Lonni, PA-C  pyridoxine (B-6) 200 MG tablet Take 200 mg by mouth daily.    [provider]  rizatriptan  (MAXALT -MLT) 10 MG disintegrating tablet TAKE 1 TABLET AT ONSET OF MIGRAINE AND REPEAT IN 2 HOURS IF NEEDED 12/03/23   Dohmeier, Dedra, MD  Vibegron (GEMTESA) 75 MG TABS Take by mouth daily.    [provider]  Zinc  50 MG TABS Take 50 mg by mouth daily.    [provider]  zonisamide  (ZONEGRAN ) 100 MG capsule TAKE 1 CAPSULE TWICE A DAY 08/15/23   Sherryl Bouchard, NP    Allergies: Oat grain extract allergy skin test, Wheat, Adhesive [tape], Morphine, Morphine and codeine, Barley grass, Desvenlafaxine, Ezetimibe, Hydrocodone  bit-homatrop mbr, Linaclotide, Metronidazole, Morphine sulfate, Oatmeal, Other, Theophylline, and Alprazolam    Review of Systems  Updated Vital Signs BP 137/83   Pulse (!) 59   Temp 98.1 F (36.7 C) (Oral)   Resp 11   SpO2 100%   Physical Exam Vitals and nursing note reviewed.  Constitutional:      General: She is not in acute distress.    Appearance: She is well-developed.  HENT:     Head: Normocephalic and atraumatic.  Eyes:     Extraocular Movements: Extraocular movements intact.     Conjunctiva/sclera: Conjunctivae normal.     Pupils: Pupils are equal, round, and reactive to light.   Cardiovascular:     Rate and Rhythm: Normal rate and regular rhythm.     Pulses:          Radial pulses are 2+ on the right side and 2+ on the left side.     Heart sounds: Normal heart sounds. No murmur heard. Pulmonary:     Effort: Pulmonary effort is normal. No respiratory distress.     Breath sounds: Normal breath sounds.  Abdominal:     Palpations: Abdomen is soft.     Tenderness: There is no abdominal tenderness.  Musculoskeletal:        General:  No swelling.     Cervical back: Normal range of motion and neck supple.  Skin:    General: Skin is warm and dry.     Capillary Refill: Capillary refill takes less than 2 seconds.  Neurological:     Mental Status: She is alert.  Psychiatric:        Mood and Affect: Mood normal.     (all labs ordered are listed, but only abnormal results are displayed) Labs Reviewed  BASIC METABOLIC PANEL WITH GFR - Abnormal; Notable for the following components:      Result Value   Glucose, Bld 125 (*)    Creatinine, Ser 1.06 (*)    GFR, Estimated 56 (*)    All other components within normal limits  CBC  TROPONIN T, HIGH SENSITIVITY  TROPONIN T, HIGH SENSITIVITY    EKG: EKG Interpretation Date/Time:  Sunday January 06 2024 14:11:02 EDT Ventricular Rate:  78 PR Interval:  169 QRS Duration:  85 QT Interval:  388 QTC Calculation: 442 R Axis:   19  Text Interpretation: Sinus rhythm Low voltage, precordial leads Confirmed by Ruthe Cornet [id number]) on 01/06/2024 2:58:02 PM  Radiology: CT Angio Chest PE W and/or Wo Contrast Result Date: 01/06/2024 EXAM: CTA CHEST 01/06/2024 06:06:12 PM TECHNIQUE: CTA of the chest was performed without and with the administration of 100 mL of iohexol  (OMNIPAQUE ) 350 MG/ML injection. Multiplanar reformatted images are provided for review. MIP images are provided for review. Automated exposure control, iterative reconstruction, and/or weight based adjustment of the mA/kV was utilized to reduce the radiation  dose to as low as reasonably achievable. COMPARISON: None available. CLINICAL HISTORY: Pulmonary embolism (PE) suspected, high prob. Mid chest pain, shortness of breath since this morning. Reports coughing up bright red blood -- states about a tablespoon of blood. FINDINGS: PULMONARY ARTERIES: Pulmonary arteries are adequately opacified for evaluation. No acute pulmonary embolus. Main pulmonary artery is normal in caliber. MEDIASTINUM: The heart and pericardium demonstrate no acute abnormality. There is no acute abnormality of the thoracic aorta. LYMPH NODES: No mediastinal, hilar or axillary lymphadenopathy. LUNGS AND PLEURA: The lungs are without acute process. No focal consolidation or pulmonary edema. No evidence of pleural effusion or pneumothorax. UPPER ABDOMEN: Small hiatal hernia. SOFT TISSUES AND BONES: No acute bone or soft tissue abnormality. IMPRESSION: 1. No pulmonary embolism or acute pulmonary abnormality. 2. Small hiatal hernia. Electronically signed by: Fonda Field MD 01/06/2024 06:22 PM EDT RP Workstation: [id number]   DG Chest 2 View Result Date: 01/06/2024 CLINICAL DATA:  Mid chest pain, short of breath since this morning, hemoptysis EXAM: CHEST - 2 VIEW COMPARISON:  11/28/2023 FINDINGS: The heart size and mediastinal contours are within normal limits. Both lungs are clear. The visualized skeletal structures are unremarkable. IMPRESSION: No active cardiopulmonary disease. Electronically Signed   By: Ozell Daring M.D.   On: 01/06/2024 15:02     Procedures   Medications Ordered in the ED  iohexol  (OMNIPAQUE ) 350 MG/ML injection 100 mL (100 mLs Intravenous Contrast Given 01/06/24 1750)                                    Medical Decision Making Amount and/or Complexity of Data Reviewed Labs: ordered. Radiology: ordered.  Risk Prescription drug management.   Katelyn Mcclain is here with shortness of breath cough just diagnosed with PEs recently on Eliquis .  Had a  little bit of blood in the  cough sputum but has not had any more coughing or of the other episodes.  Small-volume blood in the cough sputum.  Normal vitals.  No fever.  Well-appearing.  Clear breath sounds.  No signs of volume overload on exam.  Will get basic labs troponin EKG CT chest.  Differential diagnosis likely musculoskeletal type process or viral type process or cough that led to some blood because she is on a blood thinner.  Ultimately seems less likely to be ACS or worsening PE or any other acute process.  EKG shows sinus rhythm.  No ischemic changes.  Lab work thus far unremarkable.  No significant leukocytosis anemia or electrolyte abnormality.  Troponin normal.  Patient pending repeat troponin and CT scan of her chest.  All repeat troponins unremarkable.  CT scan showed no evidence of PE or pneumonia.  Overall reassuring workup.  Follow-up with primary care.  Told to return if symptoms worsen.  This chart was dictated using voice recognition software.  Despite best efforts to proofread,  errors can occur which can change the documentation meaning.      Final diagnoses:  Nonspecific chest pain  SOB (shortness of breath)    ED Discharge Orders     None          Ruthe Cornet, DO 01/06/24 1859

## 2024-01-07 DIAGNOSIS — M810 Age-related osteoporosis without current pathological fracture: Secondary | ICD-10-CM | POA: Diagnosis not present

## 2024-01-16 ENCOUNTER — Ambulatory Visit
Admission: RE | Admit: 2024-01-16 | Discharge: 2024-01-16 | Disposition: A | Discharge status: Home / Self Care | Source: Ambulatory Visit | Attending: Internal Medicine | Admitting: Internal Medicine

## 2024-01-16 DIAGNOSIS — Z1231 Encounter for screening mammogram for malignant neoplasm of breast: Secondary | ICD-10-CM

## 2024-01-16 NOTE — Progress Notes (Unsigned)
 [facility] CONSULT NOTE  Patient Care Team: [name], MD as PCP - General (Internal Medicine) Livingston Rigg, MD as Consulting Physician (Dermatology)   ASSESSMENT & PLAN:  73 y.o.female with history of migraine headache, celiac disease, bacterial overgrowth syndrome, gastroparesis, referred to [facility] for history of pulmonary embolism.   Clinically appears to be provoked with multiple driving, potential local injury, and procedures prior to her event.  Per patient her left leg does not feel back to baseline.  CTA showed resolution of PE in a little bit more than a month later.  We discussed the difference between provoked versus unprovoked VTE.  We discussed risk of recurrence increased after cessation of anticoagulation.  I recommend at least 3 months of anticoagulation.  Thereafter, based on personal risk factor, circumstances she may elect to continue anticoagulation, discontinue, or aspirin 81 mg daily.  Most people elect to discontinue anticoagulation given first-time provoked event.  If she needs additional vein procedures, recommend continue anticoagulation postoperatively during those time.  If she elects to discontinue after 3 months, patient should monitor for new signs and symptoms of DVT and PE.  At baseline, she has left lower leg swelling at times since her injury from horse stepping on her many years ago.  Her baseline swelling improves in the morning.  We discussed the difference of new onset symptoms, worsening symptoms in the left lower extremity, pulmonary symptoms as well.  Patient education information was given to her.  Recommend minimum of 3 months of anticoagulation with apixaban  Thereafter, based on patient's clinical status to decide her treatment Patient should avoid elective surgery in the acute thrombosis period for 3 months.  Patient education for risk factors and prevention of clotting We talked about  modifiable risk factors.  Prevention of clotting like deep vein thrombosis including: avoid prolonged immobilization and moving extremities every 1-2 hours during long car rides or flights.  Taking a break and moving extremities if working in a job setting with prolonged sitting.   Avoid dehydration, especially in high altitude. Avoid cigarette smoking Avoid use of hormone replacement therapy, estrogen-containing contraceptive in women.   Maintaining healthy lifestyle to prevent development of diabetes.  Weight loss if BMI over 30.  Regular exercises but not extreme.  Stay hydrated with exercises. Other risk factors for clotting are surgery, hospitalization, inflammatory disease or severe infection, trauma or injuries from inflammatory state and stasis. If developing one-sided leg swelling, pain, color change, chest pain, sudden short of breath, difficulty taking deep breaths, taking deep breath with chest discomfort or pain, dizziness or heart racing sensation, go to the emergency room immediately for evaluation. If developing trauma, uncontrolled bleeding, such as bloody stools report ED immediately. Avoid NSAIDs, aspirin while on blood thinner.  Discussed bleeding precautions and avoid high risk activities for falling while on anticoagulation. Anticoagulants do not cause bleeding.  Rather, if bleeding occurs when one is on anticoagulation, it may take longer for the bleeding to stop due to reduce coagulation capacity.  I have not made a return appointment for the patient to come back. I would be happy to assist in perioperative DVT management in the future should she need any counseling or assistance related to this issue.  All questions were answered. The patient knows to call the clinic with any problems, questions or concerns.   Pauletta JAYSON Chihuahua, MD 10/9/20254:56 PM  CHIEF COMPLAINTS/PURPOSE OF CONSULTATION:  PE  HISTORY OF PRESENTING ILLNESS:  Katelyn Mcclain  y.o. female is here  because of history of PE.  Patient was referred by [facility] Dr Lequita Flor.  Per outside record reported on 09/13/2023, report history of left leg injury from the horse stepping onto it, torn Achilles, plantar fasciitis.  Report of flaring of plantar fasciitis a month before this visit.  Left calf is swollen and painful.  Pam reports she did not have long flight this year. Last travel was [place] about 4 hours trip  Report after she cleaned up her neighbor's house for 4 days, her legs felt like hard as a rock. It was very hot. She was getting up and down repetitively. During that time her left leg. This is the same leg horse stepped on 40 years ago. She saw PCP and was referred to vein specialist. She had first US  on 6/10 and then 6/11. No DVT. Venous insufficiency on the left leg. Pam reports her left leg was more swollen before seeing them and by the times she saw Vascular was better.  09/2023 ultrasound right lower extremity showed no DVT.  Left lower extremity showed no DVT.  She started vein procedures.  10/2023 she drove to the mountains about a 4 hour drive. She noted the left leg was more swollen than normal while on the mountain. No redness or pain. Symptoms resolved after coming back to town.  She had vein procedure after return.  In Aug. She went to [facility] driving 6 hours with breaks.  11/28/23 patient presented to ED.  Complaint of cough, concerning may be coughing up a worm. She was treated with a one-time antiparasitic medication with albendazole .  11/30/23 return to ED with cough and chest pain and hemoptysis.  CTA: 1. Single small distal segmental/subsegmental embolus within the anterior right upper lobe (axial 51-53). CT findings are equivocal for right heart strain and if this remains of clinical concern, an echocardiogram may be of benefit for further characterization. 2. The tracheobronchial tree is patent. No pneumonia,  pulmonary edema, or pleural effusion.  01/06/24 patient presented with coughing up blood.  CTA: no PE.   MEDICAL HISTORY:  Past Medical History:  Diagnosis Date   Abscessed tooth    Allergic rhinitis    Asthma    Asthma    inhaler prn   Celiac disease    Depression with anxiety    Dysmenorrhea    Gastroparesis    GERD (gastroesophageal reflux disease)    Hx of low back pain    IBS (irritable bowel syndrome)    Migraine    Migraine without aura, with intractable migraine, so stated, without mention of status migrainosus 05/20/2013   Osteopenia    PE (pulmonary thromboembolism) (HCC)    Rhinovirus 2023    SURGICAL HISTORY: Past Surgical History:  Procedure Laterality Date   ABDOMINAL SURGERY     BREAST BIOPSY Right    x2   BREAST CYST ASPIRATION  1999   COLONOSCOPY     FOOT SURGERY Left    Bunionectomy   MASS EXCISION  08/08/2011   Procedure: MINOR EXCISION OF MASS;  Surgeon: Lamar LULLA Leonor Mickey., MD;  Location: [facility];  Service: Orthopedics;  Laterality: Left;  left ring excision cyst proximal phalangeal joint   NISSEN FUNDOPLICATION     ROTATOR CUFF REPAIR Right 05/2015   TONSILLECTOMY     TOTAL HIP ARTHROPLASTY Right    June 2023    SOCIAL HISTORY: Social History   Socioeconomic History  Marital status: Married    Spouse name: Not on file   Number of children: 0   Years of education: college   Highest education level: Associate degree: occupational, Scientist, product/process development, or vocational program  Occupational History    Employer: [employer]    Comment: [employer]  Tobacco Use   Smoking status: Former   Smokeless tobacco: Never   Tobacco comments:    In college 1 year  Vaping Use   Vaping status: Never Used  Substance and Sexual Activity   Alcohol use: Not Currently    Comment: wine rarely   Drug use: No   Sexual activity: Yes    Birth control/protection: Post-menopausal  Other Topics Concern   Not on file  Social History Narrative    Patient lives at home with her husband Florentina).   Patient is retired.   Education AS, in photography   Right handed.       Caffeine: decaf 1 cup every now and then   [employer]eli[place]-raised a Clinical cytogeneticist, now [employer], is Financial trader none   Social Drivers of Corporate investment banker Strain: Low Risk  (05/24/2022)   Overall Financial Resource Strain (CARDIA)    Difficulty of Paying Living Expenses: Not hard at all  Food Insecurity: No Food Insecurity (01/17/2024)   Hunger Vital Sign    Worried About Running Out of Food in the Last Year: Never true    Ran Out of Food in the Last Year: Never true  Transportation Needs: No Transportation Needs (01/17/2024)   PRAPARE - Administrator, Civil Service (Medical): No    Lack of Transportation (Non-Medical): No  Physical Activity: Inactive (05/24/2022)   Exercise Vital Sign    Days of Exercise per Week: 0 days    Minutes of Exercise per Session: 0 min  Stress: Stress Concern Present (05/24/2022)   [employer] of Occupational Health - Occupational Stress Questionnaire    Feeling of Stress : Very much  Social Connections: Socially Integrated (05/24/2022)   Social Connection and Isolation Panel    Frequency of Communication with Friends and Family: More than three times a week    Frequency of Social Gatherings with Friends and Family: More than three times a week    Attends Religious Services: More than 4 times per year    Active Member of [employer] or Organizations: Yes    Attends Engineer, structural: More than 4 times per year    Marital Status: Married  Catering manager Violence: Not At Risk (01/17/2024)   Humiliation, Afraid, Rape, and Kick questionnaire    Fear of Current or Ex-Partner: No    Emotionally Abused: No    Physically Abused: No    Sexually Abused: No    FAMILY HISTORY: Family History  Problem Relation Age of Onset   Hypertension Mother    Scoliosis Mother    Stroke Father    Dementia  Father    Atrial fibrillation Father    Cancer Father    Heart disease Sister        Congenital heart disease   Breast cancer Maternal Grandmother    Migraines Neg Hx     ALLERGIES:  is allergic to oat grain extract allergy skin test, wheat, adhesive [tape], morphine, morphine and codeine, barley grass, desvenlafaxine, ezetimibe, hydrocodone  bit-homatrop mbr, linaclotide, metronidazole, morphine sulfate, oatmeal, other, theophylline, and alprazolam.  MEDICATIONS:  Current Outpatient Medications  Medication Sig Dispense Refill   albuterol  (PROVENTIL  HFA;VENTOLIN  HFA)  108 (90 BASE) MCG/ACT inhaler Inhale 1 puff into the lungs every 6 (six) hours as needed for wheezing or shortness of breath (seasonal allergies). Currently doing 2 puffs per day     albuterol  (PROVENTIL ) (2.5 MG/3ML) 0.083% nebulizer solution Take 3 mLs (2.5 mg total) by nebulization every 6 (six) hours as needed for wheezing or shortness of breath. 75 mL 0   APIXABAN  (ELIQUIS ) VTE STARTER PACK (10MG  AND 5MG ) Take as directed on package: start with two-5mg  tablets twice daily for 7 days. On day 8, switch to one-5mg  tablet twice daily. 74 each 0   Atogepant  (QULIPTA ) 30 MG TABS Take 1 tablet (30 mg total) by mouth daily. 90 tablet 3   Calcium-Magnesium-Vitamin D (CALCIUM 1200+D3 PO) Take 1,200 mg by mouth daily.     Cholecalciferol (VITAMIN D) 50 MCG (2000 UT) CAPS Take 1 capsule by mouth daily.     clonazePAM  (KLONOPIN ) 0.5 MG tablet Take 0.5-1 tablets (0.25-0.5 mg total) by mouth 2 (two) times daily as needed for anxiety. 60 tablet 1   Coenzyme Q10 100 MG capsule Take 1 capsule by mouth daily.     Denosumab  (PROLIA  [place]) Inject into the skin. Injection every 6 months     docusate sodium (COLACE) 100 MG capsule Take 100 mg by mouth 2 (two) times daily.     DULoxetine  (CYMBALTA ) 60 MG capsule Take 1 capsule (60 mg total) by mouth daily. 180 capsule 1   Ferrous Sulfate (IRON PO) Take 65 mg by mouth daily at 6 (six) AM.      gabapentin  (NEURONTIN ) 100 MG capsule Take 300 mg by mouth at bedtime.     Glucosamine-Chondroitin (OSTEO BI-FLEX REGULAR STRENGTH PO) Take 2 tablets by mouth daily.     Melatonin 3 MG TBDP Take 3 mg by mouth at bedtime as needed.     Meth-Hyo-M Bl-Na Phos-Ph Sal (URO-MP) 118 MG CAPS TAKE 1 CAPSULE BY MOUTH TWICE A DAY AS NEEDED 30 capsule 5   montelukast (SINGULAIR) 10 MG tablet Take 10 mg by mouth at bedtime.      Multiple Vitamin (MULTIVITAMIN WITH MINERALS) TABS tablet Take 1 tablet by mouth daily.     omeprazole (PRILOSEC) 40 MG capsule Take 40 mg by mouth 2 (two) times daily.     ondansetron  (ZOFRAN ) 4 MG tablet TAKE 1 TABLET BY MOUTH EVERY 6 HOURS AS NEEDED FOR NAUSEA - IF PHERERGAN NOT WORKING  2   OVER THE COUNTER MEDICATION Take 500 mg by mouth daily. Vitamin B5     OVER THE COUNTER MEDICATION Take 1,000 mg by mouth daily. Lipsomal Glutathione     OVER THE COUNTER MEDICATION Take 500 mg by mouth daily. Black Cumin Seed Oil     pravastatin (PRAVACHOL) 10 MG tablet Take 10 mg by mouth daily.     Probiotic Product (PROBIOTIC DAILY PO) Take 1 tablet by mouth 2 (two) times daily.      promethazine  (PHENERGAN ) 25 MG tablet Take 1 tablet (25 mg total) by mouth every 6 (six) hours as needed for nausea or vomiting. 10 tablet 0   pyridoxine (B-6) 200 MG tablet Take 200 mg by mouth daily.     rizatriptan  (MAXALT -MLT) 10 MG disintegrating tablet TAKE 1 TABLET AT ONSET OF MIGRAINE AND REPEAT IN 2 HOURS IF NEEDED 27 tablet 3   Vibegron (GEMTESA) 75 MG TABS Take by mouth daily.     Zinc  50 MG TABS Take 50 mg by mouth daily.     zonisamide  (ZONEGRAN ) 100  MG capsule TAKE 1 CAPSULE TWICE A DAY 180 capsule 3   Current Facility-Administered Medications  Medication Dose Route Frequency Provider Last Rate Last Admin   ipratropium (ATROVENT ) nebulizer solution 0.5 mg  0.5 mg Nebulization Once Abigail Bernardino HERO, PA-C        REVIEW OF SYSTEMS:   All relevant systems were reviewed with the patient and are  negative.  PHYSICAL EXAMINATION:  Vitals:   01/17/24 1435  BP: 111/79  Pulse: 75  Resp: 17  Temp: (!) 97.5 F (36.4 C)  SpO2: 99%   Filed Weights   01/17/24 1435  Weight: 175 lb 8 oz (79.6 kg)    GENERAL: alert, no distress and comfortable SKIN: skin color normal LUNGS: Effort normal and no respiratory distress.  Clear to auscultation HEART: regular rate & rhythm ABDOMEN: abdomen soft, non-tender Musculoskeletal: no cyanosis and edema.  Right lower extremity no edema; left lower extremity mild edema.  Compression stocking in place.  LABORATORY DATA:  I have reviewed the data as listed   RADIOGRAPHIC STUDIES: I have personally reviewed the radiological images as listed and agreed with the findings in the report. CT Angio Chest PE W and/or Wo Contrast Result Date: 01/06/2024 EXAM: CTA CHEST 01/06/2024 06:06:12 PM TECHNIQUE: CTA of the chest was performed without and with the administration of 100 mL of iohexol  (OMNIPAQUE ) 350 MG/ML injection. Multiplanar reformatted images are provided for review. MIP images are provided for review. Automated exposure control, iterative reconstruction, and/or weight based adjustment of the mA/kV was utilized to reduce the radiation dose to as low as reasonably achievable. COMPARISON: None available. CLINICAL HISTORY: Pulmonary embolism (PE) suspected, high prob. Mid chest pain, shortness of breath since this morning. Reports coughing up bright red blood -- states about a tablespoon of blood. FINDINGS: PULMONARY ARTERIES: Pulmonary arteries are adequately opacified for evaluation. No acute pulmonary embolus. Main pulmonary artery is normal in caliber. MEDIASTINUM: The heart and pericardium demonstrate no acute abnormality. There is no acute abnormality of the thoracic aorta. LYMPH NODES: No mediastinal, hilar or axillary lymphadenopathy. LUNGS AND PLEURA: The lungs are without acute process. No focal consolidation or pulmonary edema. No evidence of  pleural effusion or pneumothorax. UPPER ABDOMEN: Small hiatal hernia. SOFT TISSUES AND BONES: No acute bone or soft tissue abnormality. IMPRESSION: 1. No pulmonary embolism or acute pulmonary abnormality. 2. Small hiatal hernia. Electronically signed by: Fonda Field MD 01/06/2024 06:22 PM EDT RP Workstation: [id number]   DG Chest 2 View Result Date: 01/06/2024 CLINICAL DATA:  Mid chest pain, short of breath since this morning, hemoptysis EXAM: CHEST - 2 VIEW COMPARISON:  11/28/2023 FINDINGS: The heart size and mediastinal contours are within normal limits. Both lungs are clear. The visualized skeletal structures are unremarkable. IMPRESSION: No active cardiopulmonary disease. Electronically Signed   By: Ozell Daring M.D.   On: 01/06/2024 15:02   MR BRAIN W WO CONTRAST Result Date: 01/03/2024  [facility] [address], [address] [place], [place] [address] [phone] NEUROIMAGING REPORT STUDY DATE: 01/03/2024 PATIENT NAME: TAMAYA PUN DOB: 1951-03-22 MRN: [id number] ORDERING CLINICIAN: Dr. Chalice CLINICAL HISTORY: 73 year old patient being evaluated for refractory migraine headache COMPARISON FILMS: None available EXAM: MRI brain with and without contrast TECHNIQUE: Sagittal T1, axial T1, T2, FLAIR, DWI, ADC map, SWI, coronal T2 and postcontrast axial and coronal T1 images were obtained through the brain CONTRAST: 7.5 mL IV gadopiclenol  IMAGING SITE: [facility] FINDINGS: The brain parenchyma shows mild age-related changes of chronic small vessel disease and generalized  cerebral atrophy.  No structural lesion, tumor or infarct is noted.  Diffusion weighted images negative for acute ischemia.  SWI images do not show significant microhemorrhages.  Subarachnoid spaces and ventricular system appear normal.  Cortical sulci and gyri show normal appearance.  Extra-axial brain structures appear normal.  Calvarium shows no abnormalities.  Orbits appear unremarkable.  Paranasal sinuses  show mild chronic  mucosal inflammatory changes.  Pituitary gland and cerebellar tonsils appear normal.  Visualized portion of the cervical spine shows no significant abnormalities.  Flow-voids of large vessels are intact and circulation appear to be patent.  Postcontrast images do not result in abnormal areas of enhancement   Unremarkable MRI scan of the brain with and without contrast. INTERPRETING PHYSICIAN: PRAMOD SETHI, MD Certified in  Neuroimaging by American Society of Neuroimaging and [employer] Neurological Subspecialities

## 2024-01-17 ENCOUNTER — Inpatient Hospital Stay

## 2024-01-17 VITALS — BP 111/79 | HR 75 | Temp 97.5°F | Resp 17 | Ht 70.5 in | Wt 175.5 lb

## 2024-01-17 DIAGNOSIS — I2699 Other pulmonary embolism without acute cor pulmonale: Secondary | ICD-10-CM | POA: Insufficient documentation

## 2024-01-17 DIAGNOSIS — Z7901 Long term (current) use of anticoagulants: Secondary | ICD-10-CM | POA: Diagnosis not present

## 2024-01-17 NOTE — Patient Instructions (Addendum)
 Patient education for risk factors and prevention of clotting We talked about modifiable risk factors.  Prevention of clotting like deep vein thrombosis including: avoid prolonged immobilization and moving extremities every 1-2 hours during long car rides or flights.  Taking a break and moving extremities if working in a job setting with prolonged sitting.   Avoid dehydration, especially in high altitude. Avoid cigarette smoking Avoid use of hormone replacement therapy, estrogen-containing contraceptive in women.   Maintaining healthy lifestyle to prevent development of diabetes.  Weight loss if BMI over 30.  Regular exercises but not extreme.  Stay hydrated with exercises. Other risk factors for clotting are surgery, hospitalization, inflammatory disease or severe infection, trauma or injuries from inflammatory state and stasis. If developing one-sided leg swelling, pain, color change, chest pain, sudden short of breath, difficulty taking deep breaths, taking deep breath with chest discomfort or pain, dizziness or heart racing sensation, go to the emergency room immediately for evaluation. If developing trauma, uncontrolled bleeding, such as bloody stools report ED immediately. Avoid NSAIDs, aspirin while on blood thinner. Patient should avoid elective surgery in the acute thrombosis period for 3 months.  Discussed bleeding precautions and avoid high risk activities for falling while on anticoagulation. Anticoagulants do not cause bleeding.  Rather, if bleeding occurs when one is on anticoagulation, it may take longer for the bleeding to stop due to reduce coagulation capacity.  Continue apixaban  minimal of 3 months, longer if needing vascular/vein procedure.

## 2024-01-21 ENCOUNTER — Other Ambulatory Visit: Payer: Self-pay | Admitting: Internal Medicine

## 2024-01-21 DIAGNOSIS — R928 Other abnormal and inconclusive findings on diagnostic imaging of breast: Secondary | ICD-10-CM

## 2024-01-28 ENCOUNTER — Ambulatory Visit
Admission: RE | Admit: 2024-01-28 | Discharge: 2024-01-28 | Disposition: A | Discharge status: Home / Self Care | Source: Ambulatory Visit | Attending: Internal Medicine | Admitting: Internal Medicine

## 2024-01-28 ENCOUNTER — Ambulatory Visit
Admission: RE | Admit: 2024-01-28 | Discharge: 2024-01-28 | Disposition: A | Source: Ambulatory Visit | Attending: Internal Medicine | Admitting: Internal Medicine

## 2024-01-28 ENCOUNTER — Other Ambulatory Visit: Payer: Self-pay | Admitting: Internal Medicine

## 2024-01-28 DIAGNOSIS — R928 Other abnormal and inconclusive findings on diagnostic imaging of breast: Secondary | ICD-10-CM | POA: Diagnosis not present

## 2024-01-28 DIAGNOSIS — N6310 Unspecified lump in the right breast, unspecified quadrant: Secondary | ICD-10-CM

## 2024-01-28 DIAGNOSIS — N6489 Other specified disorders of breast: Secondary | ICD-10-CM | POA: Diagnosis not present

## 2024-02-04 DIAGNOSIS — I87392 Chronic venous hypertension (idiopathic) with other complications of left lower extremity: Secondary | ICD-10-CM | POA: Diagnosis not present

## 2024-02-04 DIAGNOSIS — R600 Localized edema: Secondary | ICD-10-CM | POA: Diagnosis not present

## 2024-02-04 DIAGNOSIS — M79605 Pain in left leg: Secondary | ICD-10-CM | POA: Diagnosis not present

## 2024-02-04 DIAGNOSIS — I872 Venous insufficiency (chronic) (peripheral): Secondary | ICD-10-CM | POA: Diagnosis not present

## 2024-02-07 DIAGNOSIS — M25551 Pain in right hip: Secondary | ICD-10-CM | POA: Diagnosis not present

## 2024-02-13 DIAGNOSIS — R301 Vesical tenesmus: Secondary | ICD-10-CM | POA: Diagnosis not present

## 2024-02-13 DIAGNOSIS — N3010 Interstitial cystitis (chronic) without hematuria: Secondary | ICD-10-CM | POA: Diagnosis not present

## 2024-02-19 ENCOUNTER — Other Ambulatory Visit: Payer: Self-pay

## 2024-02-19 ENCOUNTER — Encounter (HOSPITAL_BASED_OUTPATIENT_CLINIC_OR_DEPARTMENT_OTHER): Payer: Self-pay

## 2024-02-19 ENCOUNTER — Emergency Department (HOSPITAL_BASED_OUTPATIENT_CLINIC_OR_DEPARTMENT_OTHER)
Admission: EM | Admit: 2024-02-19 | Discharge: 2024-02-19 | Disposition: A | Discharge status: Home / Self Care | Attending: Emergency Medicine | Admitting: Emergency Medicine

## 2024-02-19 ENCOUNTER — Emergency Department (HOSPITAL_BASED_OUTPATIENT_CLINIC_OR_DEPARTMENT_OTHER)

## 2024-02-19 DIAGNOSIS — R072 Precordial pain: Secondary | ICD-10-CM | POA: Diagnosis not present

## 2024-02-19 DIAGNOSIS — R0602 Shortness of breath: Secondary | ICD-10-CM | POA: Diagnosis not present

## 2024-02-19 DIAGNOSIS — K449 Diaphragmatic hernia without obstruction or gangrene: Secondary | ICD-10-CM | POA: Diagnosis not present

## 2024-02-19 DIAGNOSIS — Z7901 Long term (current) use of anticoagulants: Secondary | ICD-10-CM | POA: Diagnosis not present

## 2024-02-19 DIAGNOSIS — R079 Chest pain, unspecified: Secondary | ICD-10-CM

## 2024-02-19 DIAGNOSIS — R0789 Other chest pain: Secondary | ICD-10-CM | POA: Diagnosis not present

## 2024-02-19 LAB — BASIC METABOLIC PANEL WITH GFR
Anion gap: 9 (ref 5–15)
BUN: 13 mg/dL (ref 8–23)
CO2: 26 mmol/L (ref 22–32)
Calcium: 9.7 mg/dL (ref 8.9–10.3)
Chloride: 107 mmol/L (ref 98–111)
Creatinine, Ser: 1.05 mg/dL — ABNORMAL HIGH (ref 0.44–1.00)
GFR, Estimated: 57 mL/min — ABNORMAL LOW (ref >60)
Glucose, Bld: 108 mg/dL — ABNORMAL HIGH (ref 70–99)
Potassium: 4 mmol/L (ref 3.5–5.1)
Sodium: 142 mmol/L (ref 135–145)

## 2024-02-19 LAB — HEPATIC FUNCTION PANEL
ALT: 18 U/L (ref 0–44)
AST: 26 U/L (ref 15–41)
Albumin: 4.3 g/dL (ref 3.5–5.0)
Alkaline Phosphatase: 57 U/L (ref 38–126)
Bilirubin, Direct: <0.1 mg/dL (ref 0.0–0.2)
Total Bilirubin: 0.3 mg/dL (ref 0.0–1.2)
Total Protein: 6.8 g/dL (ref 6.5–8.1)

## 2024-02-19 LAB — RESP PANEL BY RT-PCR (RSV, FLU A&B, COVID)  RVPGX2
Influenza A by PCR: NEGATIVE
Influenza B by PCR: NEGATIVE
Resp Syncytial Virus by PCR: NEGATIVE
SARS Coronavirus 2 by RT PCR: NEGATIVE

## 2024-02-19 LAB — CBC
HCT: 40.4 % (ref 36.0–46.0)
Hemoglobin: 13.5 g/dL (ref 12.0–15.0)
MCH: 31.1 pg (ref 26.0–34.0)
MCHC: 33.4 g/dL (ref 30.0–36.0)
MCV: 93.1 fL (ref 80.0–100.0)
Platelets: 277 K/uL (ref 150–400)
RBC: 4.34 MIL/uL (ref 3.87–5.11)
RDW: 13.3 % (ref 11.5–15.5)
WBC: 7.3 K/uL (ref 4.0–10.5)
nRBC: 0 % (ref 0.0–0.2)

## 2024-02-19 LAB — TROPONIN T, HIGH SENSITIVITY
Troponin T High Sensitivity: <15 ng/L (ref 0–19)
Troponin T High Sensitivity: <15 ng/L (ref 0–19)

## 2024-02-19 LAB — LIPASE, BLOOD: Lipase: 21 U/L (ref 11–51)

## 2024-02-19 LAB — PRO BRAIN NATRIURETIC PEPTIDE: Pro Brain Natriuretic Peptide: 162 pg/mL (ref <300.0)

## 2024-02-19 MED ORDER — ALUM & MAG HYDROXIDE-SIMETH 200-200-20 MG/5ML PO SUSP
30.0000 mL | Freq: Once | ORAL | Status: AC
  Administered 2024-02-19: 30 mL via ORAL
  Filled 2024-02-19: qty 30

## 2024-02-19 MED ORDER — LIDOCAINE VISCOUS HCL 2 % MT SOLN
15.0000 mL | Freq: Once | OROMUCOSAL | Status: AC
  Administered 2024-02-19: 15 mL via ORAL
  Filled 2024-02-19: qty 15

## 2024-02-19 MED ORDER — IOHEXOL 350 MG/ML SOLN
100.0000 mL | Freq: Once | INTRAVENOUS | Status: AC | PRN
  Administered 2024-02-19: 76 mL via INTRAVENOUS

## 2024-02-19 NOTE — ED Triage Notes (Signed)
 Patient reports chest pain and shortness of breath with no radiation. Reports she is recovering from having a PE in the past. This started last night.

## 2024-02-19 NOTE — Discharge Instructions (Addendum)
 Your EKG, laboratory evaluation and CT imaging was overall reassuring.  Recommend you follow-up outpatient with your primary care provider

## 2024-02-19 NOTE — ED Provider Notes (Signed)
 [place] EMERGENCY DEPARTMENT AT [facility] Provider Note   CSN: [id number] Arrival date & time: 02/19/24  1152     Patient presents with: Chest Pain   Katelyn Mcclain is a 73 y.o. female.    Chest Pain Associated symptoms: shortness of breath      73 year old female with medical history significant for migraine headaches, depression, anxiety, gastroparesis, GERD, IBS, PE currently on Eliquis  and compliant with medication who presents to the emergency department with a chief complaint of chest pain and shortness of breath.  The patient states that she had wine last night and had shrimp and went home and noticed substernal chest heaviness, dull ache noted with associated mild shortness of breath.  No radiation of the pain.  No cough fever or chills.  She has not missed any doses of her Eliquis .  Symptoms were worse when she tried to lay down at night and had difficulty falling asleep.  She finally fell asleep and woke up and symptoms had persisted prompting her presentation to the emergency department.  She feels like her symptoms are different than normal GERD.  She denies any nausea or vomiting, no diaphoresis.  She has had no cough fever or chills.  She denies any lower extremity swelling.  Her last coronary calcium score was 1.6 in May 2025.  Prior to Admission medications   Medication Sig Start Date End Date Taking? Authorizing Provider  albuterol  (PROVENTIL  HFA;VENTOLIN  HFA) 108 (90 BASE) MCG/ACT inhaler Inhale 1 puff into the lungs every 6 (six) hours as needed for wheezing or shortness of breath (seasonal allergies). Currently doing 2 puffs per day    [provider]  albuterol  (PROVENTIL ) (2.5 MG/3ML) 0.083% nebulizer solution Take 3 mLs (2.5 mg total) by nebulization every 6 (six) hours as needed for wheezing or shortness of breath. 02/20/22   Dean Clarity, MD  APIXABAN  (ELIQUIS ) VTE STARTER PACK (10MG  AND 5MG ) Take as directed on package: start with two-5mg   tablets twice daily for 7 days. On day 8, switch to one-5mg  tablet twice daily. 12/01/23   Odell Balls, PA-C  Atogepant  (QULIPTA ) 30 MG TABS Take 1 tablet (30 mg total) by mouth daily. 12/03/23   Dohmeier, Dedra, MD  Calcium-Magnesium-Vitamin D (CALCIUM 1200+D3 PO) Take 1,200 mg by mouth daily.    [provider]  Cholecalciferol (VITAMIN D) 50 MCG (2000 UT) CAPS Take 1 capsule by mouth daily.    [provider]  clonazePAM  (KLONOPIN ) 0.5 MG tablet Take 0.5-1 tablets (0.25-0.5 mg total) by mouth 2 (two) times daily as needed for anxiety. 12/08/22   Rhys Verneita DASEN, PA-C  Coenzyme Q10 100 MG capsule Take 1 capsule by mouth daily.    [provider]  Denosumab  (PROLIA  [place]) Inject into the skin. Injection every 6 months    [provider]  docusate sodium (COLACE) 100 MG capsule Take 100 mg by mouth 2 (two) times daily.    [provider]  DULoxetine  (CYMBALTA ) 60 MG capsule Take 1 capsule (60 mg total) by mouth daily. 12/07/23   Rhys Verneita DASEN, PA-C  Ferrous Sulfate (IRON PO) Take 65 mg by mouth daily at 6 (six) AM.    [provider]  gabapentin  (NEURONTIN ) 100 MG capsule Take 300 mg by mouth at bedtime. 08/09/20   [provider]  Glucosamine-Chondroitin (OSTEO BI-FLEX REGULAR STRENGTH PO) Take 2 tablets by mouth daily.    [provider]  Melatonin 3 MG TBDP Take 3 mg by mouth at bedtime as  needed. 11/22/23   Dohmeier, Dedra, MD  Meth-Hyo-M Starlene Phos-Ph Sal (URO-MP) 118 MG CAPS TAKE 1 CAPSULE BY MOUTH TWICE A DAY AS NEEDED 05/04/19   McKenzie, Belvie CROME, MD  montelukast (SINGULAIR) 10 MG tablet Take 10 mg by mouth at bedtime.  11/03/13   [provider]  Multiple Vitamin (MULTIVITAMIN WITH MINERALS) TABS tablet Take 1 tablet by mouth daily.    [provider]  omeprazole (PRILOSEC) 40 MG capsule Take 40 mg by mouth 2 (two) times daily. 10/10/16   [provider]  ondansetron  (ZOFRAN ) 4 MG tablet TAKE 1  TABLET BY MOUTH EVERY 6 HOURS AS NEEDED FOR NAUSEA - IF PHERERGAN NOT WORKING 07/30/17   [provider]  OVER THE COUNTER MEDICATION Take 500 mg by mouth daily. Vitamin B5    [provider]  OVER THE COUNTER MEDICATION Take 1,000 mg by mouth daily. Lipsomal Glutathione    [provider]  OVER THE COUNTER MEDICATION Take 500 mg by mouth daily. [employer] Scientist, Historical, MD  pravastatin (PRAVACHOL) 10 MG tablet Take 10 mg by mouth daily.    [provider]  Probiotic Product (PROBIOTIC DAILY PO) Take 1 tablet by mouth 2 (two) times daily.     [provider]  promethazine  (PHENERGAN ) 25 MG tablet Take 1 tablet (25 mg total) by mouth every 6 (six) hours as needed for nausea or vomiting. 11/30/15   Lawyer, Lonni, PA-C  pyridoxine (B-6) 200 MG tablet Take 200 mg by mouth daily.    [provider]  rizatriptan  (MAXALT -MLT) 10 MG disintegrating tablet TAKE 1 TABLET AT ONSET OF MIGRAINE AND REPEAT IN 2 HOURS IF NEEDED 12/03/23   Dohmeier, Dedra, MD  Vibegron (GEMTESA) 75 MG TABS Take by mouth daily.    [provider]  Zinc  50 MG TABS Take 50 mg by mouth daily.    [provider]  zonisamide  (ZONEGRAN ) 100 MG capsule TAKE 1 CAPSULE TWICE A DAY 08/15/23   Sherryl Bouchard, NP    Allergies: Oat grain extract allergy skin test, Wheat, Adhesive [tape], Morphine, Morphine and codeine, Barley grass, Desvenlafaxine, Ezetimibe, Hydrocodone  bit-homatrop mbr, Linaclotide, Metronidazole, Morphine sulfate, Oatmeal, Other, Theophylline, and Alprazolam    Review of Systems  Respiratory:  Positive for shortness of breath.   Cardiovascular:  Positive for chest pain.  All other systems reviewed and are negative.   Updated Vital Signs BP (!) 144/85   Pulse 68   Temp (!) 97.5 F (36.4 C) (Oral)   Resp 17   SpO2 100%   Physical Exam Vitals and nursing note reviewed.  Constitutional:      General: She is not in acute  distress.    Appearance: She is well-developed.  HENT:     Head: Normocephalic and atraumatic.  Eyes:     Conjunctiva/sclera: Conjunctivae normal.  Cardiovascular:     Rate and Rhythm: Normal rate and regular rhythm.     Heart sounds: No murmur heard. Pulmonary:     Effort: Pulmonary effort is normal. No respiratory distress.     Breath sounds: Normal breath sounds.  Abdominal:     Palpations: Abdomen is soft.     Tenderness: There is no abdominal tenderness.  Musculoskeletal:        General: No swelling.     Cervical back: Neck supple.  Skin:    General: Skin is warm and dry.     Capillary Refill: Capillary refill takes less than 2 seconds.  Neurological:     Mental Status: She is alert.  Psychiatric:        Mood and Affect: Mood normal.     (all labs ordered are listed, but only abnormal results are displayed) Labs Reviewed  BASIC METABOLIC PANEL WITH GFR - Abnormal; Notable for the following components:      Result Value   Glucose, Bld 108 (*)    Creatinine, Ser 1.05 (*)    GFR, Estimated 57 (*)    All other components within normal limits  RESP PANEL BY RT-PCR (RSV, FLU A&B, COVID)  RVPGX2  CBC  PRO BRAIN NATRIURETIC PEPTIDE  HEPATIC FUNCTION PANEL  LIPASE, BLOOD  TROPONIN T, HIGH SENSITIVITY  TROPONIN T, HIGH SENSITIVITY    EKG: EKG Interpretation Date/Time:  Tuesday February 19 2024 12:04:12 EST Ventricular Rate:  68 PR Interval:  164 QRS Duration:  87 QT Interval:  398 QTC Calculation: 421 R Axis:   5  Text Interpretation: Sinus rhythm Low voltage, precordial leads Confirmed by Jerrol Agent ([id number]) on 02/19/2024 12:05:08 PM  Radiology: CT Angio Chest PE W and/or Wo Contrast Result Date: 02/19/2024 EXAM: CTA of the Chest with contrast for PE 02/19/2024 01:07:53 PM TECHNIQUE: CTA of the chest was performed after the administration of 35 mL IV Omnipaque  contrast. Multiplanar reformatted images are provided for review. MIP images are provided for review.  Automated exposure control, iterative reconstruction, and/or weight based adjustment of the mA/kV was utilized to reduce the radiation dose to as low as reasonably achievable. COMPARISON: Comparison is made to study dated 01/06/2024. CLINICAL HISTORY: Pulmonary embolism (PE) suspected, high prob. FINDINGS: PULMONARY ARTERIES: Pulmonary arteries are adequately opacified for evaluation. No pulmonary embolism. Main pulmonary artery is normal in caliber. MEDIASTINUM: The heart and pericardium demonstrate no acute abnormality. There is no acute abnormality of the thoracic aorta. LYMPH NODES: No mediastinal, hilar or axillary lymphadenopathy. LUNGS AND PLEURA: Small nodule in the right middle lobe measures 4 mm. This nodule is unchanged from 01/06/2024. No focal consolidation or pulmonary edema. No pleural effusion or pneumothorax. UPPER ABDOMEN: Limited images of the upper abdomen demonstrate a small hiatal hernia. SOFT TISSUES AND BONES: No acute bone or soft tissue abnormality. IMPRESSION: 1. No pulmonary embolism. 2. Small right middle lobe pulmonary nodule measuring 4 mm, unchanged; no routine follow-up imaging is recommended per Fleischner Society Guidelines for patients 35 years or older without high-risk features. Electronically signed by: Norleen Boxer MD 02/19/2024 01:37 PM EST RP Workstation: [id number]     Procedures   Medications Ordered in the ED  iohexol  (OMNIPAQUE ) 350 MG/ML injection 100 mL (76 mLs Intravenous Contrast Given 02/19/24 1303)  alum & mag hydroxide-simeth (MAALOX/MYLANTA) 200-200-20 MG/5ML suspension 30 mL (30 mLs Oral Given 02/19/24 1407)    And  lidocaine  (XYLOCAINE ) 2 % viscous mouth solution 15 mL (15 mLs Oral Given 02/19/24 1407)                HEART Score: 5                    Medical Decision Making Amount and/or Complexity of Data Reviewed Labs: ordered. Radiology: ordered.  Risk OTC drugs. Prescription drug management.     73 year old female with medical  history significant for migraine headaches, depression, anxiety, gastroparesis, GERD, IBS, PE currently on Eliquis  and compliant with medication who presents to the emergency department with a chief complaint of chest pain and shortness of breath.  The patient states that she had wine last night  and had shrimp and went home and noticed substernal chest heaviness, dull ache noted with associated mild shortness of breath.  No radiation of the pain.  No cough fever or chills.  She has not missed any doses of her Eliquis .  Symptoms were worse when she tried to lay down at night and had difficulty falling asleep.  She finally fell asleep and woke up and symptoms had persisted prompting her presentation to the emergency department.  She feels like her symptoms are different than normal GERD.  She denies any nausea or vomiting, no diaphoresis.  She has had no cough fever or chills.  She denies any lower extremity swelling. Her last coronary calcium score was 1.6 in May 2025.  On arrival, the patient was afebrile, vitally stable heart rate 74, BP 129/72, respirate 16, saturating 100% on room air.  Physical exam unremarkable.  Medical Decision Making: INDEA DEARMAN is a 73 y.o. female who presented to the ED today with chest pain, detailed above.  Based on patient's comorbidities, patient has a heart score of 5.    Patient placed on continuous vitals and telemetry monitoring while in ED which was reviewed periodically.  Complete initial physical exam performed, notably the patient was CTAB.   Reviewed and confirmed nursing documentation for past medical history, family history, social history.    Initial Assessment: With the patient's presentation of left-sided chest pain, most likely diagnosis is musculoskeletal chest pain versus GERD, although ACS remains on the differential. Other diagnoses were considered including (but not limited to) pulmonary embolism, community-acquired pneumonia, aortic dissection,  pneumothorax, underlying bony abnormality, anemia. These are considered less likely due to history of present illness and physical exam findings.    Aortic Dissection also considered but seems less likely based on the location, quality, onset, and severity of symptoms in this case.   Initial Plan: Evaluate for ACS with delta troponin and EKG evaluated as below  Evaluate for dissection, bony abnormality, or pneumonia with chest x-ray and screening laboratory evaluation including CBC, BMP  Further evaluation for pulmonary embolism indicated at this time based on patient's PERC and Wells score.  Further evaluation for Thoracic Aortic Dissection not indicated at this time based on patient's clinical history and PE findings.   Initial Study Results: EKG was reviewed independently. Rate, rhythm, axis, intervals all examined and without medically relevant abnormality. ST segments without concerns for elevations.    Laboratory  Delta troponin demonstrated normal values  BNP normal CBC and BMP without obvious metabolic or inflammatory abnormalities requiring further evaluation   Radiology  CT Angio Chest PE W and/or Wo Contrast Result Date: 02/19/2024 EXAM: CTA of the Chest with contrast for PE 02/19/2024 01:07:53 PM TECHNIQUE: CTA of the chest was performed after the administration of 35 mL IV Omnipaque  contrast. Multiplanar reformatted images are provided for review. MIP images are provided for review. Automated exposure control, iterative reconstruction, and/or weight based adjustment of the mA/kV was utilized to reduce the radiation dose to as low as reasonably achievable. COMPARISON: Comparison is made to study dated 01/06/2024. CLINICAL HISTORY: Pulmonary embolism (PE) suspected, high prob. FINDINGS: PULMONARY ARTERIES: Pulmonary arteries are adequately opacified for evaluation. No pulmonary embolism. Main pulmonary artery is normal in caliber. MEDIASTINUM: The heart and pericardium demonstrate no  acute abnormality. There is no acute abnormality of the thoracic aorta. LYMPH NODES: No mediastinal, hilar or axillary lymphadenopathy. LUNGS AND PLEURA: Small nodule in the right middle lobe measures 4 mm. This nodule is unchanged from 01/06/2024.  No focal consolidation or pulmonary edema. No pleural effusion or pneumothorax. UPPER ABDOMEN: Limited images of the upper abdomen demonstrate a small hiatal hernia. SOFT TISSUES AND BONES: No acute bone or soft tissue abnormality. IMPRESSION: 1. No pulmonary embolism. 2. Small right middle lobe pulmonary nodule measuring 4 mm, unchanged; no routine follow-up imaging is recommended per Fleischner Society Guidelines for patients 35 years or older without high-risk features. Electronically signed by: Norleen Boxer MD 02/19/2024 01:37 PM EST RP Workstation: [id number]   MM 3D DIAGNOSTIC MAMMOGRAM UNILATERAL RIGHT BREAST Result Date: 01/28/2024 CLINICAL DATA:  Recalled for possible RIGHT breast mass. EXAM: DIGITAL DIAGNOSTIC UNILATERAL RIGHT MAMMOGRAM WITH TOMOSYNTHESIS AND CAD; ULTRASOUND RIGHT BREAST LIMITED TECHNIQUE: Right digital diagnostic mammography and breast tomosynthesis was performed. The images were evaluated with computer-aided detection. ; Targeted ultrasound examination of the right breast was performed COMPARISON:  Previous exam(s). ACR Breast Density Category c: The breasts are heterogeneously dense, which may obscure small masses. FINDINGS: RIGHT: Mammogram: Spot compression CC and MLO views of the RIGHT breast were obtained. 6 mm oval obscured mass persists in the middle to posterior depth of the upper RIGHT breast. Ultrasound: Targeted sonographic evaluation of the RIGHT breast was performed, however no correlate for the mammographic mass could be identified. IMPRESSION: Probably benign 6 mm RIGHT upper breast mass at middle to posterior depth. No sonographic correlate. RECOMMENDATION: RIGHT diagnostic mammogram and possible ultrasound in 6 months. I  have discussed the findings and recommendations with the patient. If applicable, a reminder letter will be sent to the patient regarding the next appointment. BI-RADS CATEGORY  3: Probably benign. Electronically Signed   By: Aliene Lloyd M.D.   On: 01/28/2024 12:25   US  LIMITED ULTRASOUND INCLUDING AXILLA RIGHT BREAST Result Date: 01/28/2024 CLINICAL DATA:  Recalled for possible RIGHT breast mass. EXAM: DIGITAL DIAGNOSTIC UNILATERAL RIGHT MAMMOGRAM WITH TOMOSYNTHESIS AND CAD; ULTRASOUND RIGHT BREAST LIMITED TECHNIQUE: Right digital diagnostic mammography and breast tomosynthesis was performed. The images were evaluated with computer-aided detection. ; Targeted ultrasound examination of the right breast was performed COMPARISON:  Previous exam(s). ACR Breast Density Category c: The breasts are heterogeneously dense, which may obscure small masses. FINDINGS: RIGHT: Mammogram: Spot compression CC and MLO views of the RIGHT breast were obtained. 6 mm oval obscured mass persists in the middle to posterior depth of the upper RIGHT breast. Ultrasound: Targeted sonographic evaluation of the RIGHT breast was performed, however no correlate for the mammographic mass could be identified. IMPRESSION: Probably benign 6 mm RIGHT upper breast mass at middle to posterior depth. No sonographic correlate. RECOMMENDATION: RIGHT diagnostic mammogram and possible ultrasound in 6 months. I have discussed the findings and recommendations with the patient. If applicable, a reminder letter will be sent to the patient regarding the next appointment. BI-RADS CATEGORY  3: Probably benign. Electronically Signed   By: Aliene Lloyd M.D.   On: 01/28/2024 12:25    Final Assessment and Plan: CTA PE study showed no evidence of PE.  There was a small pulmonary nodule noted on CT however no follow-up imaging was recommended.  No pneumothorax, no pneumonia, no evidence of pericardial effusion.  Cardiac troponins were normal x 2 and a BNP was  normal.  COVID flu and RSV PCR testing was negative, lipase, hepatic function panel, CBC, BMP all unremarkable. She has no chest wall TTP. There is no rash.  Chest pain is not migratory, is not ripping or tearing or radiating to the back.  Low concern for aortic dissection.  Patient administered  Maalox and viscous lidocaine .  On repeat assessment, the patient was chest pain free.  Not hypoxic, overall vitally stable.  Overall reassuring workup in the emergency department.  Unclear etiology of the patient's discomfort, considered GERD.  Overall feel that the patient is stable for discharge and close follow-up, all questions answered at time of discharge.     Final diagnoses:  Chest pain, unspecified type  SOB (shortness of breath)    ED Discharge Orders     None          Jerrol Agent, MD 02/19/24 1657

## 2024-02-21 DIAGNOSIS — R301 Vesical tenesmus: Secondary | ICD-10-CM | POA: Diagnosis not present

## 2024-02-21 DIAGNOSIS — N3010 Interstitial cystitis (chronic) without hematuria: Secondary | ICD-10-CM | POA: Diagnosis not present

## 2024-02-27 DIAGNOSIS — N3281 Overactive bladder: Secondary | ICD-10-CM | POA: Diagnosis not present

## 2024-02-27 DIAGNOSIS — N200 Calculus of kidney: Secondary | ICD-10-CM | POA: Diagnosis not present

## 2024-02-27 DIAGNOSIS — R351 Nocturia: Secondary | ICD-10-CM | POA: Diagnosis not present

## 2024-02-27 DIAGNOSIS — R350 Frequency of micturition: Secondary | ICD-10-CM | POA: Diagnosis not present

## 2024-03-10 DIAGNOSIS — G43909 Migraine, unspecified, not intractable, without status migrainosus: Secondary | ICD-10-CM | POA: Diagnosis not present

## 2024-03-10 DIAGNOSIS — H2513 Age-related nuclear cataract, bilateral: Secondary | ICD-10-CM | POA: Diagnosis not present

## 2024-03-10 DIAGNOSIS — I2693 Single subsegmental pulmonary embolism without acute cor pulmonale: Secondary | ICD-10-CM | POA: Diagnosis not present

## 2024-03-10 DIAGNOSIS — E782 Mixed hyperlipidemia: Secondary | ICD-10-CM | POA: Diagnosis not present

## 2024-03-10 DIAGNOSIS — N1831 Chronic kidney disease, stage 3a: Secondary | ICD-10-CM | POA: Diagnosis not present

## 2024-03-10 DIAGNOSIS — H43811 Vitreous degeneration, right eye: Secondary | ICD-10-CM | POA: Diagnosis not present

## 2024-03-10 DIAGNOSIS — H0102B Squamous blepharitis left eye, upper and lower eyelids: Secondary | ICD-10-CM | POA: Diagnosis not present

## 2024-03-10 DIAGNOSIS — H0102A Squamous blepharitis right eye, upper and lower eyelids: Secondary | ICD-10-CM | POA: Diagnosis not present

## 2024-03-12 DIAGNOSIS — M79643 Pain in unspecified hand: Secondary | ICD-10-CM | POA: Diagnosis not present

## 2024-03-12 DIAGNOSIS — M79671 Pain in right foot: Secondary | ICD-10-CM | POA: Diagnosis not present

## 2024-03-12 DIAGNOSIS — M79641 Pain in right hand: Secondary | ICD-10-CM | POA: Diagnosis not present

## 2024-03-12 DIAGNOSIS — M79672 Pain in left foot: Secondary | ICD-10-CM | POA: Diagnosis not present

## 2024-03-12 DIAGNOSIS — M2550 Pain in unspecified joint: Secondary | ICD-10-CM | POA: Diagnosis not present

## 2024-03-12 DIAGNOSIS — M1990 Unspecified osteoarthritis, unspecified site: Secondary | ICD-10-CM | POA: Diagnosis not present

## 2024-03-12 DIAGNOSIS — M791 Myalgia, unspecified site: Secondary | ICD-10-CM | POA: Diagnosis not present

## 2024-03-12 DIAGNOSIS — R5383 Other fatigue: Secondary | ICD-10-CM | POA: Diagnosis not present

## 2024-03-12 DIAGNOSIS — M79673 Pain in unspecified foot: Secondary | ICD-10-CM | POA: Diagnosis not present

## 2024-03-12 DIAGNOSIS — M7918 Myalgia, other site: Secondary | ICD-10-CM | POA: Diagnosis not present

## 2024-03-12 DIAGNOSIS — I2699 Other pulmonary embolism without acute cor pulmonale: Secondary | ICD-10-CM | POA: Diagnosis not present

## 2024-03-12 DIAGNOSIS — M79642 Pain in left hand: Secondary | ICD-10-CM | POA: Diagnosis not present

## 2024-03-17 DIAGNOSIS — K900 Celiac disease: Secondary | ICD-10-CM | POA: Diagnosis not present

## 2024-03-17 DIAGNOSIS — M159 Polyosteoarthritis, unspecified: Secondary | ICD-10-CM | POA: Diagnosis not present

## 2024-03-17 DIAGNOSIS — M810 Age-related osteoporosis without current pathological fracture: Secondary | ICD-10-CM | POA: Diagnosis not present

## 2024-03-17 DIAGNOSIS — N3010 Interstitial cystitis (chronic) without hematuria: Secondary | ICD-10-CM | POA: Diagnosis not present

## 2024-03-17 DIAGNOSIS — J309 Allergic rhinitis, unspecified: Secondary | ICD-10-CM | POA: Diagnosis not present

## 2024-03-17 DIAGNOSIS — Z23 Encounter for immunization: Secondary | ICD-10-CM | POA: Diagnosis not present

## 2024-03-17 DIAGNOSIS — N1831 Chronic kidney disease, stage 3a: Secondary | ICD-10-CM | POA: Diagnosis not present

## 2024-03-17 DIAGNOSIS — F411 Generalized anxiety disorder: Secondary | ICD-10-CM | POA: Diagnosis not present

## 2024-03-17 DIAGNOSIS — E782 Mixed hyperlipidemia: Secondary | ICD-10-CM | POA: Diagnosis not present

## 2024-03-20 DIAGNOSIS — M359 Systemic involvement of connective tissue, unspecified: Secondary | ICD-10-CM | POA: Diagnosis not present

## 2024-03-20 DIAGNOSIS — R899 Unspecified abnormal finding in specimens from other organs, systems and tissues: Secondary | ICD-10-CM | POA: Diagnosis not present

## 2024-03-20 DIAGNOSIS — M1990 Unspecified osteoarthritis, unspecified site: Secondary | ICD-10-CM | POA: Diagnosis not present

## 2024-03-20 DIAGNOSIS — M479 Spondylosis, unspecified: Secondary | ICD-10-CM | POA: Diagnosis not present

## 2024-03-20 DIAGNOSIS — M797 Fibromyalgia: Secondary | ICD-10-CM | POA: Diagnosis not present

## 2024-03-20 DIAGNOSIS — M62838 Other muscle spasm: Secondary | ICD-10-CM | POA: Diagnosis not present

## 2024-03-21 DIAGNOSIS — R350 Frequency of micturition: Secondary | ICD-10-CM | POA: Diagnosis not present

## 2024-03-21 DIAGNOSIS — R3121 Asymptomatic microscopic hematuria: Secondary | ICD-10-CM | POA: Diagnosis not present

## 2024-04-23 ENCOUNTER — Telehealth: Payer: Self-pay

## 2024-04-23 NOTE — Telephone Encounter (Signed)
 [employer]  benefit forms have been faxed back to [employer] on 04/16/24

## 2024-06-02 ENCOUNTER — Ambulatory Visit: Admitting: Physician Assistant

## 2024-06-02 ENCOUNTER — Ambulatory Visit: Admitting: Neurology

## 2024-06-06 ENCOUNTER — Ambulatory Visit: Admitting: Physician Assistant

## 2024-08-06 ENCOUNTER — Encounter
